# Patient Record
Sex: Female | Born: 1935 | Race: White | Hispanic: No | Marital: Married | Smoking: Never smoker
Health system: Southern US, Community
[De-identification: ages and names within clinical notes are randomized; demographics above are authoritative.]

## PROBLEM LIST (undated history)

## (undated) DIAGNOSIS — Z9889 Other specified postprocedural states: Secondary | ICD-10-CM

## (undated) DIAGNOSIS — J302 Other seasonal allergic rhinitis: Secondary | ICD-10-CM

## (undated) DIAGNOSIS — M549 Dorsalgia, unspecified: Secondary | ICD-10-CM

## (undated) DIAGNOSIS — C4491 Basal cell carcinoma of skin, unspecified: Secondary | ICD-10-CM

## (undated) DIAGNOSIS — J349 Unspecified disorder of nose and nasal sinuses: Secondary | ICD-10-CM

## (undated) DIAGNOSIS — M1990 Unspecified osteoarthritis, unspecified site: Secondary | ICD-10-CM

## (undated) DIAGNOSIS — R112 Nausea with vomiting, unspecified: Secondary | ICD-10-CM

## (undated) DIAGNOSIS — C8315 Mantle cell lymphoma, lymph nodes of inguinal region and lower limb: Secondary | ICD-10-CM

## (undated) DIAGNOSIS — K219 Gastro-esophageal reflux disease without esophagitis: Secondary | ICD-10-CM

## (undated) DIAGNOSIS — H9390 Unspecified disorder of ear, unspecified ear: Secondary | ICD-10-CM

## (undated) DIAGNOSIS — J392 Other diseases of pharynx: Secondary | ICD-10-CM

## (undated) DIAGNOSIS — G4700 Insomnia, unspecified: Secondary | ICD-10-CM

## (undated) DIAGNOSIS — J209 Acute bronchitis, unspecified: Secondary | ICD-10-CM

## (undated) DIAGNOSIS — G252 Other specified forms of tremor: Secondary | ICD-10-CM

## (undated) DIAGNOSIS — R413 Other amnesia: Secondary | ICD-10-CM

## (undated) DIAGNOSIS — F0280 Dementia in other diseases classified elsewhere without behavioral disturbance: Secondary | ICD-10-CM

## (undated) DIAGNOSIS — G250 Essential tremor: Secondary | ICD-10-CM

## (undated) DIAGNOSIS — G309 Alzheimer's disease, unspecified: Secondary | ICD-10-CM

## (undated) DIAGNOSIS — J309 Allergic rhinitis, unspecified: Secondary | ICD-10-CM

## (undated) HISTORY — DX: Acute bronchitis, unspecified: J20.9

## (undated) HISTORY — DX: Other specified forms of tremor: G25.0

## (undated) HISTORY — PX: OTHER SURGICAL HISTORY: SHX169

## (undated) HISTORY — DX: Allergic rhinitis, unspecified: J30.9

## (undated) HISTORY — DX: Insomnia, unspecified: G47.00

## (undated) HISTORY — DX: Other amnesia: R41.3

## (undated) HISTORY — DX: Alzheimer's disease, unspecified: G30.9

## (undated) HISTORY — DX: Other specified forms of tremor: G25.2

## (undated) HISTORY — PX: TOTAL ABDOMINAL HYSTERECTOMY W/ BILATERAL SALPINGOOPHORECTOMY: SHX83

## (undated) HISTORY — DX: Dementia in other diseases classified elsewhere without behavioral disturbance: F02.80

## (undated) HISTORY — DX: Gastro-esophageal reflux disease without esophagitis: K21.9

## (undated) HISTORY — PX: EXCISION, SQUAMOUS CELL: SHX4033

## (undated) HISTORY — PX: APPENDECTOMY (OPEN): SHX54

## (undated) HISTORY — DX: Basal cell carcinoma of skin, unspecified: C44.91

## (undated) HISTORY — PX: INSERTION, CATHETER, CENTRAL VENOUS, TUNNELED, WITH SUBCUTANEOUS PORT: SHX4977

## (undated) HISTORY — DX: Other diseases of pharynx: J39.2

---

## 1989-07-15 DIAGNOSIS — Z8719 Personal history of other diseases of the digestive system: Secondary | ICD-10-CM

## 1989-07-15 HISTORY — DX: Personal history of other diseases of the digestive system: Z87.19

## 1995-11-13 ENCOUNTER — Emergency Department: Admit: 1995-11-13 | Payer: Self-pay | Admitting: Internal Medicine

## 1998-03-30 ENCOUNTER — Emergency Department: Admit: 1998-03-30 | Payer: Self-pay | Admitting: Internal Medicine

## 1999-09-20 ENCOUNTER — Encounter: Admission: RE | Admit: 1999-09-20 | Discharge: 1999-09-20 | Payer: Self-pay | Admitting: Internal Medicine

## 1999-09-20 ENCOUNTER — Encounter: Payer: Self-pay | Admitting: Internal Medicine

## 1999-09-29 ENCOUNTER — Encounter: Admission: RE | Admit: 1999-09-29 | Discharge: 1999-09-29 | Payer: Self-pay | Admitting: Internal Medicine

## 1999-09-29 ENCOUNTER — Encounter: Payer: Self-pay | Admitting: Internal Medicine

## 2000-03-13 ENCOUNTER — Encounter: Payer: Self-pay | Admitting: Internal Medicine

## 2000-03-13 ENCOUNTER — Encounter: Admission: RE | Admit: 2000-03-13 | Discharge: 2000-03-13 | Payer: Self-pay | Admitting: Internal Medicine

## 2001-04-03 ENCOUNTER — Encounter: Payer: Self-pay | Admitting: Internal Medicine

## 2001-04-03 ENCOUNTER — Encounter: Admission: RE | Admit: 2001-04-03 | Discharge: 2001-04-03 | Payer: Self-pay | Admitting: Internal Medicine

## 2001-06-20 ENCOUNTER — Emergency Department: Admit: 2001-06-20 | Payer: Self-pay | Source: Emergency Department | Admitting: Emergency Medicine

## 2001-10-26 ENCOUNTER — Encounter: Admission: RE | Admit: 2001-10-26 | Discharge: 2001-10-26 | Payer: Self-pay | Admitting: Internal Medicine

## 2001-10-26 ENCOUNTER — Encounter: Payer: Self-pay | Admitting: Internal Medicine

## 2002-06-06 ENCOUNTER — Encounter: Payer: Self-pay | Admitting: Internal Medicine

## 2002-06-06 ENCOUNTER — Encounter: Admission: RE | Admit: 2002-06-06 | Discharge: 2002-06-06 | Payer: Self-pay | Admitting: Internal Medicine

## 2003-05-30 ENCOUNTER — Encounter: Payer: Self-pay | Admitting: Internal Medicine

## 2003-05-30 ENCOUNTER — Encounter: Admission: RE | Admit: 2003-05-30 | Discharge: 2003-05-30 | Payer: Self-pay | Admitting: Internal Medicine

## 2003-11-03 ENCOUNTER — Inpatient Hospital Stay
Admission: EM | Admit: 2003-11-03 | Disposition: A | Discharge status: Home / Self Care | Payer: Self-pay | Source: Emergency Department | Admitting: Internal Medicine

## 2004-01-01 ENCOUNTER — Ambulatory Visit
Admit: 2004-01-01 | Disposition: A | Discharge status: Home / Self Care | Payer: Self-pay | Source: Ambulatory Visit | Admitting: Family Medicine

## 2004-01-15 ENCOUNTER — Ambulatory Visit
Admit: 2004-01-15 | Disposition: A | Discharge status: Home / Self Care | Payer: Self-pay | Source: Ambulatory Visit | Admitting: Family Medicine

## 2004-06-01 ENCOUNTER — Encounter: Admission: RE | Admit: 2004-06-01 | Discharge: 2004-06-01 | Payer: Self-pay | Admitting: Internal Medicine

## 2005-01-14 ENCOUNTER — Ambulatory Visit: Payer: Self-pay | Admitting: Internal Medicine

## 2005-03-14 ENCOUNTER — Encounter (INDEPENDENT_AMBULATORY_CARE_PROVIDER_SITE_OTHER): Payer: Self-pay | Admitting: *Deleted

## 2005-03-14 ENCOUNTER — Ambulatory Visit (HOSPITAL_COMMUNITY): Admission: RE | Admit: 2005-03-14 | Discharge: 2005-03-14 | Payer: Self-pay | Admitting: Gastroenterology

## 2005-06-14 ENCOUNTER — Encounter: Admission: RE | Admit: 2005-06-14 | Discharge: 2005-06-14 | Payer: Self-pay | Admitting: Internal Medicine

## 2005-07-14 ENCOUNTER — Ambulatory Visit: Payer: Self-pay | Admitting: Internal Medicine

## 2006-05-03 ENCOUNTER — Ambulatory Visit: Payer: Self-pay | Admitting: Internal Medicine

## 2006-07-10 ENCOUNTER — Encounter: Admission: RE | Admit: 2006-07-10 | Discharge: 2006-07-10 | Payer: Self-pay | Admitting: Internal Medicine

## 2007-05-01 ENCOUNTER — Ambulatory Visit: Payer: Self-pay | Admitting: Internal Medicine

## 2007-08-27 ENCOUNTER — Encounter: Admission: RE | Admit: 2007-08-27 | Discharge: 2007-08-27 | Payer: Self-pay | Admitting: Internal Medicine

## 2008-04-28 DIAGNOSIS — J209 Acute bronchitis, unspecified: Secondary | ICD-10-CM

## 2008-04-28 DIAGNOSIS — G252 Other specified forms of tremor: Secondary | ICD-10-CM

## 2008-04-28 DIAGNOSIS — G250 Essential tremor: Secondary | ICD-10-CM

## 2008-04-28 DIAGNOSIS — J309 Allergic rhinitis, unspecified: Secondary | ICD-10-CM | POA: Insufficient documentation

## 2008-04-28 DIAGNOSIS — K219 Gastro-esophageal reflux disease without esophagitis: Secondary | ICD-10-CM

## 2008-04-28 DIAGNOSIS — G4700 Insomnia, unspecified: Secondary | ICD-10-CM

## 2008-04-29 ENCOUNTER — Ambulatory Visit: Payer: Self-pay | Admitting: Internal Medicine

## 2008-08-28 ENCOUNTER — Encounter: Admission: RE | Admit: 2008-08-28 | Discharge: 2008-08-28 | Payer: Self-pay | Admitting: Internal Medicine

## 2009-03-17 ENCOUNTER — Ambulatory Visit
Admit: 2009-03-17 | Disposition: A | Discharge status: Home / Self Care | Payer: Self-pay | Source: Ambulatory Visit | Admitting: Family Medicine

## 2009-04-28 ENCOUNTER — Ambulatory Visit: Payer: Self-pay | Admitting: Internal Medicine

## 2009-09-02 ENCOUNTER — Encounter: Admission: RE | Admit: 2009-09-02 | Discharge: 2009-09-02 | Payer: Self-pay | Admitting: Internal Medicine

## 2009-09-15 ENCOUNTER — Encounter: Admission: RE | Admit: 2009-09-15 | Discharge: 2009-09-15 | Payer: Self-pay | Admitting: Internal Medicine

## 2010-04-29 ENCOUNTER — Telehealth: Payer: Self-pay | Admitting: Internal Medicine

## 2010-08-31 ENCOUNTER — Encounter: Admission: RE | Admit: 2010-08-31 | Discharge: 2010-08-31 | Payer: Self-pay | Admitting: Internal Medicine

## 2010-09-23 ENCOUNTER — Encounter: Admission: RE | Admit: 2010-09-23 | Discharge: 2010-09-23 | Payer: Self-pay | Admitting: Internal Medicine

## 2010-12-05 ENCOUNTER — Encounter: Payer: Self-pay | Admitting: Internal Medicine

## 2010-12-06 ENCOUNTER — Encounter: Payer: Self-pay | Admitting: Internal Medicine

## 2010-12-14 NOTE — Progress Notes (Signed)
Summary: nos appt  Phone Note Call from Patient   Caller: [email or web address]  Call For: Nicole [name] Summary of Call: Pt states she will call at a later date to rsc nos from 6/15. Initial call taken by: Darletta Moll,  April 29, 2010 3:51 PM

## 2011-03-29 NOTE — Assessment & Plan Note (Signed)
[facility]                             PULMONARY OFFICE NOTE   NAME:JOHNSONRaylene Miyamoto                    MRN:          [id number]  DATE:05/01/2007                            DOB:          [birth year]    PULMONARY OFFICE FOLLOWUP   PROBLEMS:  1. Asthmatic bronchitis.  2. Allergic rhinitis.  3. Insomnia.  4. Esophageal reflux.  5. Essential tremor.   HISTORY:  She is doing quite well.  She has rarely needed albuterol or  Advair, not in months.  She has not used Ambien in a long time.  She  asks I refill her daily Prevacid, which I agreed to do after explaining  that they would probably be pushed back from her insurance, and in the  long run, this ought to be handled by Dr. Newell Coral attention.  We  discussed possible aggravation of her familial essential tremor by  bronchodilators, but she has not been using them.   MEDICATIONS:  1. Prevacid 30 mg.  2. Aspirin 325 mg.  3. Calcium with vitamin D.  4. P.r.n. use of albuterol.  5. Allegra 180 mg.  6. Ambien CR 6.25 mg.  7. Nasonex.   No medication allergy.   OBJECTIVE:  Weight 188 pounds, BP 110/72, pulse 84, room air saturation  96%.  Conjunctivae clear.  Nasal mucosa looks normal.  CHEST:  Very clear.  HEART:  Sounds are regular without murmur.  She has the distinct head-  bob tremor now.   IMPRESSION:  Her respiratory complaints are doing very well off of  therapy.  We discussed how to resume with her meds should there be a  seasonal change.  Her reflux remains active if she does not take a  Prevacid daily, and this worked better for her than Nexium.   PLAN:  1. Prevacid 30 mg refilled.  2. Allegra 180 mg refilled.  Both meds with discussion of insurance      limitations and available over-the-counter products.  3. Refilled Nasonex.  4. Schedule return in 1 year, but earlier p.r.n.  Primary followup is      through Dr. Earl Gala.     Clinton D. Maple Hudson, MD, Tonny Bollman, FACP  Electronically Signed    CDY/MedQ  DD: 05/01/2007  DT: 05/02/2007  Job #: [id number]   cc:   Theressa Millard, M.D.

## 2011-04-01 NOTE — Op Note (Signed)
NAME:  CATILYN, BOGGUS NO.:  [id number]   MEDICAL RECORD NO.:  [id number]          PATIENT TYPE:  AMB   LOCATION:  ENDO                         FACILITY:  [facility]   PHYSICIAN:  Danise Edge, M.D.   DATE OF BIRTH:  1933-12-14   DATE OF PROCEDURE:  03/14/2005  DATE OF DISCHARGE:                                 OPERATIVE REPORT   PROCEDURE:  Colonoscopy and polypectomy.   INDICATIONS:  Ms. Bryelle Spiewak is a 77 year old female born 10/29/1934. Ms. Buckles required a segmental colon resection to remove a  neoplastic polyp years ago. She is scheduled to undergo a surveillance  colonoscopy with polypectomy to prevent colon cancer.   ENDOSCOPIST:  Danise Edge, M.D.   PREMEDICATION:  Versed 8.5 mg, Demerol 70 mg.   DESCRIPTION OF PROCEDURE:  After obtaining informed consent, Ms. Mckenzie was  placed in the left lateral decubitus position. I administered intravenous  Demerol and intravenous Versed to achieve conscious sedation for the  procedure. The patient's blood pressure, oxygen saturation and cardiac  rhythm were monitored throughout the procedure and documented in the medical  record.   Anal inspection and digital rectal exam were normal. The Olympus adjustable  pediatric colonoscope was introduced into the rectum and with some degree of  difficulty advanced to the cecum. I had to reposition Ms. Glomski from the  left lateral decubitus position to the supine position in order to negotiate  the left colon due to colonic loop formation. Once around the splenic  flexure, the advancement of the endoscope to the cecum was easy. Colonic  preparation for the exam today was satisfactory.   RECTUM:  Normal. Retroflexed view of the distal rectum normal.  SIGMOID COLON AND DESCENDING COLON:  Extensive left colonic diverticulosis  SPLENIC FLEXURE:  Normal.  TRANSVERSE COLON:  Normal.  HEPATIC FLEXURE:  Normal.  ASCENDING COLON:  From the distal ascending  colon, a 1 mm sessile polyp was  removed with the cold biopsy forceps.  CECUM AND ILEOCECAL VALVE:  Normal.   ASSESSMENT:  1.  Extensive left colonic diverticulosis.  2.  A diminutive polyp was removed from the distal ascending colon with the      cold biopsy forceps.   RECOMMENDATIONS:  Repeat colonoscopy in five years.      MJ/MEDQ  D:  03/14/2005  T:  03/14/2005  Job:  [id number]   cc:   Theressa Millard, M.D.  [address] [place]  [place] [address]  Fax: [phone]

## 2011-11-16 ENCOUNTER — Encounter (INDEPENDENT_AMBULATORY_CARE_PROVIDER_SITE_OTHER): Payer: Self-pay | Admitting: Neurology

## 2011-11-16 ENCOUNTER — Ambulatory Visit (INDEPENDENT_AMBULATORY_CARE_PROVIDER_SITE_OTHER): Payer: Medicare Other | Admitting: Neurology

## 2011-11-16 VITALS — BP 136/73 | HR 88 | Ht 64.0 in | Wt 126.6 lb

## 2011-11-16 DIAGNOSIS — M545 Low back pain, unspecified: Secondary | ICD-10-CM | POA: Insufficient documentation

## 2011-11-16 DIAGNOSIS — IMO0002 Reserved for concepts with insufficient information to code with codable children: Secondary | ICD-10-CM

## 2011-11-16 DIAGNOSIS — M5416 Radiculopathy, lumbar region: Secondary | ICD-10-CM

## 2011-11-16 MED ORDER — GABAPENTIN 300 MG PO CAPS
ORAL_CAPSULE | ORAL | Status: DC
Start: 2011-11-16 — End: 2012-06-01

## 2011-11-16 MED ORDER — CYCLOBENZAPRINE HCL 10 MG PO TABS
ORAL_TABLET | ORAL | Status: AC
Start: 2011-11-16 — End: 2011-11-26

## 2011-11-16 NOTE — Progress Notes (Signed)
HISTORY OF PRESENT ILLNESS:  Maureen Vasquez is a 78 year old woman with some back discomfort.  She was  doing well until several years ago when she started having back discomfort.   Since May, this has increased significantly.  She describes pain in the  lower lumbar spine radiating down the right lateral hip and thigh, at times  posteriorly.  This does not reach the ankle.  She remembers having shoveled  snow in 2010 and this may have exacerbated her discomfort.  She has been  seen by [facility] and has undergone physical therapy  at [facility].  She was, however, interested in [facility]  in [place] performing surgery, but decided to have a neurological  evaluation prior to this.  She has also tried Medrol Dosepak  intermittently.  She has never had epidural steroid injections.  She denies  any bowel or bladder complaints.  She is able to ambulate more than several  blocks.  She notes that at times in the mall she will have to use a because  she has right-sided sciatic discomfort.  She denies any incontinence or any  leg weakness or heaviness or numbness that is persistent.  She occasionally  will note shooting pains when she coughs or sneezes.  The pain is at least  a 4 to 5/10 in intensity.     ALLERGIES:  ASPIRIN and CODEINE.     MEDICATIONS:  B12, Tylenol, magnesium, Sudafed.     PAST MEDICAL HISTORY:  Pancreatitis.     PAST SURGICAL HISTORY:  Appendicectomy.     RECORD REVIEW:  I reviewed her patient filled out form.  We will request the reports from  her orthopedic office and she will bring them at her next visit.  I  reviewed her MRI lumbar spine films from August 08, 2011 with the  patient.  This shows L3-L4, L4-L5, and L5-S1 disk bulge without significant  cord compromise or significant canal stenosis.  Minimal canal stenosis is  noted at L4-L5.  Right L5 neural foraminal moderate narrowing is noted.     SOCIAL HISTORY:  She is married.  She does not smoke.  She  drinks wine.     FAMILY HISTORY:  Negative for any similar neurologic problems.     REVIEW OF SYSTEMS:  Positive for the above complaints.  She denied any fever, night sweats,  chills, rash, or any recent back injury, all other 10-point review of  systems is negative.     PHYSICAL EXAMINATION:  Straight leg raising test was mildly positive on the right.  She had no  numbness to pinprick.  Her reflexes were intact.  Her sensation is intact.        Blood pressure 136/73, pulse 88, height 1.626 m (5\' 4" ), weight 57.425 kg (126 lb 9.6 oz).    General: On examination today, blood pressure was stable, vitals were stable, pulse was regular, temperature is afebrile, and respiratory rate regular. The patient was in no apparent distress. There was no pallor, icterus, or cyanosis. There was no pedal edema. The lungs were clear to auscultation. Heart sounds were S1 and S2 normal. There was no carotid bruit. Peripheral pulses were intact. Extremities were normal in color.     NEURO EXAM:  Alert oriented x3. The memory, speech, attention, awareness, and concentration were intact.   CRANIAL NERVES:Pupils were equal, round, reactive to light and accommodation. Visual fields were full. Extraocular movements were intact without nystagmus. Face was symmetric. Facial sensations  were intact. The tongue was midline. The palate elevated equally. The rest of the cranial exam from II-XII was normal.   MOTOR : Strength was 5/5 in the upper and lower extremities with no pronator drift.   COORDINATION : was intact with finger-to nose and heel-to-shin testing being normal. GAIT: was normal. Romberg's sign was negative.   SENSORY exam was intact for pinprick, light touch, and temperature as well as vibration.  TONE was normal. There was no atrophy or fasciculations.   REFLEXES were 2 in the upper and lower extremities with downgoing plantars. Fundoscopy revealed sharp discs bilaterally.     IMPRESSION AND PLAN:  A 78 year old woman with right  L5-S1 mild lumbar radiculopathy with minimal  lumbar stenosis at L4-L5.  I recommended further evaluation with EMG nerve  conduction studies as well as a trial of physical therapy, and Neurontin  300 mg nightly, and after a week 300 mg b.i.d., in addition to Flexeril 10  mg nightly for a week and then p.r.n.  She will follow with [place] in 6 weeks.   We will request records from her orthopedic's office and she will bring  them at her next visit.  If her back pain and discomfort persists then a  trial of epidural steroid injections may be warranted.  If these  conservative measures prove inefficacious then referral to neurosurgery may  be warranted for right L5 lumbar laminectomy or diskectomy procedures.   This plan was discussed with the patient and her husband.  We discussed  good back measures.

## 2011-11-17 ENCOUNTER — Encounter (INDEPENDENT_AMBULATORY_CARE_PROVIDER_SITE_OTHER): Payer: Self-pay | Admitting: Neurology

## 2011-12-07 ENCOUNTER — Encounter (INDEPENDENT_AMBULATORY_CARE_PROVIDER_SITE_OTHER): Payer: Medicare Other

## 2012-01-04 ENCOUNTER — Other Ambulatory Visit (INDEPENDENT_AMBULATORY_CARE_PROVIDER_SITE_OTHER): Payer: Medicare Other | Admitting: Neurology

## 2012-02-21 ENCOUNTER — Ambulatory Visit (INDEPENDENT_AMBULATORY_CARE_PROVIDER_SITE_OTHER): Payer: Medicare Other | Admitting: Internal Medicine

## 2012-02-21 ENCOUNTER — Encounter: Payer: Self-pay | Admitting: *Deleted

## 2012-02-21 VITALS — BP 122/74 | HR 79 | Ht 65.5 in | Wt 159.2 lb

## 2012-02-21 DIAGNOSIS — J309 Allergic rhinitis, unspecified: Secondary | ICD-10-CM

## 2012-02-21 DIAGNOSIS — J209 Acute bronchitis, unspecified: Secondary | ICD-10-CM

## 2012-02-21 MED ORDER — METHYLPREDNISOLONE ACETATE 80 MG/ML IJ SUSP
80.0000 mg | Freq: Once | INTRAMUSCULAR | Status: AC
  Administered 2012-02-21: 80 mg via INTRAMUSCULAR

## 2012-02-21 MED ORDER — PROMETHAZINE-CODEINE 6.25-10 MG/5ML PO SYRP: 5.0000 mL | ORAL_SOLUTION | Freq: Four times a day (QID) | ORAL | Status: AC | PRN

## 2012-02-21 MED ORDER — LEVALBUTEROL HCL 0.63 MG/3ML IN NEBU
0.6300 mg | INHALATION_SOLUTION | Freq: Once | RESPIRATORY_TRACT | Status: AC
  Administered 2012-02-21: 0.63 mg via RESPIRATORY_TRACT

## 2012-02-21 NOTE — Progress Notes (Signed)
02/21/12- 78 yoF never smoker followed for asthma, rhinitis, complicated by GERD, essential tremor  PCP Dr Earl Gala LOV- 04/28/09   Here with daughter Steward Drone  Having wheezing and SOB, deep cough x 4-5 weeks; UC 2 times-was given abx and not clearing up. Had stopped allergy vaccine, but remembers allergy shots as helpful. With an upper respiratory infection and ear ache she went to ENT who told her there was very little hearing loss. She notices tinnitus and sharp pains in her ears. Now for 2 weeks has had head and chest congestion, mostly clear mucus with no fever. At last visit to urgent care she was treated with Biaxin and several days of prednisone. These are completed. She was told chest x-ray was negative for pneumonia. Has noted some wheeze. Pro air was no help. Never tried Nasonex with this because she was afraid to steroids would aggravate an infection.  ROS-see HPI Constitutional:   No-   weight loss, night sweats, fevers, chills, fatigue, lassitude. HEENT:   No-  headaches, difficulty swallowing, tooth/dental problems, sore throat,       No-  sneezing, itching,  +ear ache, nasal congestion, post nasal drip,  CV:  No-   chest pain, orthopnea, PND, swelling in lower extremities, anasarca, dizziness, palpitations Resp: No-   shortness of breath with exertion or at rest.              +   productive cough,  No non-productive cough,  No- coughing up of blood.              No-   change in color of mucus.  + wheezing.   Skin: No-   rash or lesions. GI:  No-   heartburn, indigestion, abdominal pain, nausea, vomiting,  GU: No-   dysuria, MS:  No-   joint pain or swelling.   Neuro-     nothing unusual Psych:  No- change in mood or affect. No depression or anxiety.  No memory loss.  OBJ- Physical Exam General- Alert, Oriented, Affect-appropriate, Distress- none acute Skin- rash-none, lesions- none, excoriation- none Lymphadenopathy- none Head- atraumatic            Eyes- Gross vision intact,  PERRLA, conjunctivae and secretions clear            Ears- Hearing, canals-normal            Nose- Clear, no-Septal dev, mucus, polyps, erosion, perforation             Throat- Mallampati II , mucosa clear , drainage- none, tonsils- atrophic Neck- flexible , trachea midline, no stridor , thyroid nl, carotid no bruit Chest - symmetrical excursion , unlabored           Heart/CV- RRR , no murmur , no gallop  , no rub, nl s1 s2                           - JVD- none , edema- none, stasis changes- none, varices- none           Lung- clear to P&A, wheeze- none, + harsh cough , dullness-none, rub- none           Chest wall-  Abd- tender-no, distended-no, bowel sounds-present, HSM- no Br/ Gen/ Rectal- Not done, not indicated Extrem- cyanosis- none, clubbing, none, atrophy- none, strength- nl Neuro- tremor

## 2012-02-21 NOTE — Patient Instructions (Signed)
Neb xop 0.63  Depo 80  Sample Advair 250      1 puff, then rinse mouth well, twice every day till sample used up.  Script for cough syrup

## 2012-02-26 NOTE — Assessment & Plan Note (Signed)
Tracheobronchitis which began as an upper respiratory infection with bronchitis, probably a viral syndrome initially. Plan-nebulizer treatment with Xopenex, Depo-Medrol, sample Advair 250. Medications discussed.

## 2012-06-01 ENCOUNTER — Emergency Department: Payer: Medicare Other

## 2012-06-01 ENCOUNTER — Emergency Department
Admission: EM | Admit: 2012-06-01 | Discharge: 2012-06-01 | Disposition: A | Discharge status: Home / Self Care | Payer: Medicare Other | Attending: Emergency Medical Services | Admitting: Emergency Medical Services

## 2012-06-01 DIAGNOSIS — N390 Urinary tract infection, site not specified: Secondary | ICD-10-CM

## 2012-06-01 DIAGNOSIS — R1011 Right upper quadrant pain: Secondary | ICD-10-CM | POA: Insufficient documentation

## 2012-06-01 HISTORY — DX: Dorsalgia, unspecified: M54.9

## 2012-06-01 LAB — CBC AND DIFFERENTIAL
Basophils Absolute Automated: 0.03 10*3/uL (ref 0.00–0.20)
Basophils Automated: 0 % (ref 0–2)
Eosinophils Absolute Automated: 0.08 10*3/uL (ref 0.00–0.70)
Eosinophils Automated: 1 % (ref 0–5)
Hematocrit: 38.9 % (ref 37.0–47.0)
Hgb: 13.8 g/dL (ref 12.0–16.0)
Immature Granulocytes Absolute: 0.01 10*3/uL
Immature Granulocytes: 0 % (ref 0–1)
Lymphocytes Absolute Automated: 1.56 10*3/uL (ref 0.50–4.40)
Lymphocytes Automated: 23 % (ref 15–41)
MCH: 30.6 pg (ref 28.0–32.0)
MCHC: 35.5 g/dL (ref 32.0–36.0)
MCV: 86.3 fL (ref 80.0–100.0)
MPV: 10.3 fL (ref 9.4–12.3)
Monocytes Absolute Automated: 0.92 10*3/uL (ref 0.00–1.20)
Monocytes: 13 % — ABNORMAL HIGH (ref 0–11)
Neutrophils Absolute: 4.27 10*3/uL (ref 1.80–8.10)
Neutrophils: 62 % (ref 52–75)
Nucleated RBC: 0 /100 WBC (ref 0–1)
Platelets: 290 10*3/uL (ref 140–400)
RBC: 4.51 10*6/uL (ref 4.20–5.40)
RDW: 13 % (ref 12–15)
WBC: 6.86 10*3/uL (ref 3.50–10.80)

## 2012-06-01 LAB — COMPREHENSIVE METABOLIC PANEL
ALT: 8 U/L (ref 0–55)
AST (SGOT): 20 U/L (ref 5–34)
Albumin/Globulin Ratio: 1.6 (ref 0.9–2.2)
Albumin: 4.2 g/dL (ref 3.5–5.0)
Alkaline Phosphatase: 75 U/L (ref 40–150)
Anion Gap: 9 (ref 5.0–15.0)
BUN: 11 mg/dL (ref 7–19)
Bilirubin, Total: 0.6 mg/dL (ref 0.2–1.2)
CO2: 22 mEq/L (ref 22–29)
Calcium: 9.5 mg/dL (ref 7.9–10.6)
Chloride: 98 mEq/L (ref 98–107)
Creatinine: 0.7 mg/dL (ref 0.6–1.0)
Globulin: 2.6 g/dL (ref 2.0–3.6)
Glucose: 86 mg/dL (ref 70–100)
Potassium: 4.6 mEq/L (ref 3.5–5.1)
Protein, Total: 6.8 g/dL (ref 6.0–8.3)
Sodium: 129 mEq/L — ABNORMAL LOW (ref 136–145)

## 2012-06-01 LAB — URINALYSIS, REFLEX TO MICROSCOPIC EXAM IF INDICATED
Bilirubin, UA: NEGATIVE
Blood, UA: NEGATIVE
Glucose, UA: NEGATIVE
Nitrite, UA: POSITIVE — AB
Protein, UR: NEGATIVE
Specific Gravity UA: 1.006 (ref 1.001–1.035)
Urine pH: 6.5 (ref 5.0–8.0)
Urobilinogen, UA: NORMAL mg/dL

## 2012-06-01 LAB — GFR: EGFR: >60

## 2012-06-01 LAB — CK: Creatine Kinase (CK): 63 U/L (ref 29–168)

## 2012-06-01 LAB — TROPONIN I: Troponin I: <0.01 ng/mL (ref 0.00–0.09)

## 2012-06-01 MED ORDER — IOHEXOL 350 MG/ML IV SOLN
80.00 mL | Freq: Once | INTRAVENOUS | Status: AC | PRN
Start: 2012-06-01 — End: 2012-06-01
  Administered 2012-06-01: 80 mL via INTRAVENOUS

## 2012-06-01 MED ORDER — NITROFURANTOIN MONOHYD MACRO 100 MG PO CAPS
100.00 mg | ORAL_CAPSULE | Freq: Once | ORAL | Status: AC
Start: 2012-06-01 — End: 2012-06-01
  Administered 2012-06-01: 100 mg via ORAL
  Filled 2012-06-01: qty 1

## 2012-06-01 MED ORDER — NITROFURANTOIN MONOHYD MACRO 100 MG PO CAPS
100.00 mg | ORAL_CAPSULE | Freq: Two times a day (BID) | ORAL | Status: AC
Start: 2012-06-01 — End: 2012-06-08

## 2012-06-01 MED ORDER — IOHEXOL 300 MG/ML IJ SOLN
30.00 mL | Freq: Once | INTRAMUSCULAR | Status: AC
Start: 2012-06-01 — End: 2012-06-01
  Administered 2012-06-01: 30 mL via ORAL

## 2012-06-01 NOTE — ED Notes (Signed)
1 week of upper abdominal and mid back pain. Pain increases at night and after eating. No n/v/d. No fevers. No urinary complaints. No constipation.

## 2012-06-01 NOTE — ED Provider Notes (Signed)
Physician/Midlevel provider first contact with patient: 06/01/12 1817     78 yof, c/o intermittent RUQ and epigastric pain x 1 week especially after eating meals, with pain radiation to mid back.  Denies n/v/d, dysuria, chest pain, diaphoresis, lightheadedness, recent abdominal surgeries.     History     Chief Complaint   Patient presents with   . Abdominal Pain     Patient is a 78 y.o. female presenting with abdominal pain. The history is provided by the patient. No language interpreter was used.   Abdominal Pain  The primary symptoms of the illness include abdominal pain and shortness of breath. The primary symptoms of the illness do not include fever, fatigue, nausea, vomiting, diarrhea, hematemesis, hematochezia, dysuria, vaginal discharge or vaginal bleeding. The current episode started more than 2 days ago. The onset of the illness was gradual. The problem has not changed since onset.  The patient states that she believes she is currently not pregnant. The patient has not had a change in bowel habit. Additional symptoms associated with the illness include back pain. Symptoms associated with the illness do not include chills, anorexia, diaphoresis, heartburn, constipation, urgency, hematuria or frequency.       Past Medical History   Diagnosis Date   . Pancreatitis    . Back pain        Past Surgical History   Procedure Date   . Appendectomy        No family history on file.    Social  History   Substance Use Topics   . Smoking status: Never Smoker    . Smokeless tobacco: Not on file   . Alcohol Use: 4.2 oz/week     7 Glasses of wine per week       .     Allergies   Allergen Reactions   . Aspirin Other (See Comments)     Oral and stomach irritation if taken daily   . Codeine Nausea Only       Current/Home Medications    CHOLECALCIFEROL (VITAMIN D-3 PO)    Take by mouth.      CYANOCOBALAMIN (VITAMIN B-12 PO)    Take by mouth.      MAGNESIUM PO    Take by mouth.      PSEUDOEPHEDRINE HCL (SUDAFED PO)    Take by  mouth as needed.          Review of Systems   Constitutional: Negative for fever, chills, diaphoresis, activity change and fatigue.   HENT: Negative for congestion, sore throat, rhinorrhea, trouble swallowing, neck pain and neck stiffness.    Eyes: Negative for visual disturbance.   Respiratory: Positive for shortness of breath. Negative for cough.    Cardiovascular: Negative for chest pain.   Gastrointestinal: Positive for abdominal pain. Negative for heartburn, nausea, vomiting, diarrhea, constipation, hematochezia, anorexia and hematemesis.   Genitourinary: Negative for dysuria, urgency, frequency, hematuria, vaginal bleeding, vaginal discharge and difficulty urinating.   Musculoskeletal: Positive for back pain.   Skin: Negative for color change and rash.   Neurological: Negative for dizziness, weakness and headaches.   Psychiatric/Behavioral: Negative for behavioral problems and confusion.       Physical Exam    BP 160/74  Pulse 74  Temp 98.4 F (36.9 C)  Resp 18  Ht 1.651 m  Wt 56.7 kg  BMI 20.80 kg/m2  SpO2 96%    Physical Exam   Constitutional: She is oriented to person, place, and time. She appears well-developed  and well-nourished. No distress.   HENT:   Head: Normocephalic and atraumatic.   Nose: Nose normal.   Mouth/Throat: Oropharynx is clear and moist.   Eyes: Conjunctivae normal are normal. Pupils are equal, round, and reactive to light. No scleral icterus.   Neck: Normal range of motion. Neck supple.   Cardiovascular: Normal rate, regular rhythm, normal heart sounds and intact distal pulses.    Pulmonary/Chest: Effort normal and breath sounds normal. No respiratory distress. She has no wheezes. She has no rales.   Abdominal: Soft. Bowel sounds are normal. She exhibits no distension. There is tenderness in the right upper quadrant and epigastric area. There is positive Murphy's sign. There is no rigidity, no rebound, no guarding and no CVA tenderness.   Musculoskeletal: Normal range of motion.  She exhibits no edema and no tenderness.   Neurological: She is alert and oriented to person, place, and time.   Skin: Skin is warm and dry. No rash noted. She is not diaphoretic. No pallor.   Psychiatric: She has a normal mood and affect. Her behavior is normal.       MDM and ED Course     ED Medication Orders      Start     Status Ordering Provider    06/01/12 2213   nitrofurantoin (macrocrystal-monohydrate) (MACROBID) capsule 100 mg   Once      Route: Oral  Ordered Dose: 100 mg         Ordered Quinetta Shilling    06/01/12 1915   iohexol (OMNIPAQUE) 300 MG/ML injection 30 mL   Once      Route: Oral  Ordered Dose: 30 mL         Last MAR action:  Given Avondre Richens                 MDM      Procedures    Clinical Impression & Disposition     Clinical Impression  Final diagnoses:   UTI (lower urinary tract infection)   Abdominal pain, chronic, right upper quadrant        ED Disposition     Discharge [name] discharge to home/self care.    Condition at discharge: Good             New Prescriptions    NITROFURANTOIN, MACROCRYSTAL-MONOHYDRATE, (MACROBID) 100 MG CAPSULE    Take 1 capsule (100 mg total) by mouth 2 (two) times daily.      Clinical Course / MDM     Working Differential (not completely inclusive):       Notes: The patient presents with abdominal pain without signs of peritonitis or other life-threatening or serious etiology. The patient appears stable for discharge and has been instructed to return immediately if the symptoms worsen or change in any way, or in 8-12 hours if not improved for re-evaluation. Abdominal pain warnings were given and I emphasized the need for close follow-up with their primary care physician.      Discussion of abnormal results/incidental findings:         Data Review     Nursing records reviewed and agree: Yes    Laboratory results reviewed by ED provider: Yes  Radiologic study results reviewed by ED provider: Yes  Radiologic Studies Interpreted (viewed) by ED provider: No       Rendering Provider: Georga Hacking      Signout     Patient signed out to:      Signout notes:  Georga Hacking, PA  06/01/12 1848    Georga Hacking, PA  06/01/12 2215

## 2012-06-02 LAB — ECG 12-LEAD
Atrial Rate: 77 {beats}/min
P Axis: 61 degrees
P-R Interval: 192 ms
Q-T Interval: 356 ms
QRS Duration: 70 ms
QTC Calculation (Bezet): 402 ms
R Axis: 30 degrees
T Axis: 70 degrees
Ventricular Rate: 77 {beats}/min

## 2012-07-17 ENCOUNTER — Other Ambulatory Visit: Payer: Self-pay | Admitting: Internal Medicine

## 2012-07-17 DIAGNOSIS — Z1231 Encounter for screening mammogram for malignant neoplasm of breast: Secondary | ICD-10-CM

## 2012-08-13 ENCOUNTER — Ambulatory Visit
Admission: RE | Admit: 2012-08-13 | Discharge: 2012-08-13 | Disposition: A | Discharge status: Home / Self Care | Payer: Medicare Other | Source: Ambulatory Visit | Attending: Internal Medicine | Admitting: Internal Medicine

## 2012-08-13 DIAGNOSIS — Z1231 Encounter for screening mammogram for malignant neoplasm of breast: Secondary | ICD-10-CM

## 2012-08-14 DIAGNOSIS — C8590 Non-Hodgkin lymphoma, unspecified, unspecified site: Secondary | ICD-10-CM

## 2012-08-14 HISTORY — DX: Non-Hodgkin lymphoma, unspecified, unspecified site: C85.90

## 2012-10-25 ENCOUNTER — Encounter
Admission: RE | Disposition: A | Discharge status: Home / Self Care | Payer: Self-pay | Source: Ambulatory Visit | Attending: Body Imaging

## 2012-10-25 ENCOUNTER — Ambulatory Visit
Admission: RE | Admit: 2012-10-25 | Discharge: 2012-10-25 | Disposition: A | Discharge status: Home / Self Care | Payer: Medicare Other | Source: Ambulatory Visit | Attending: Body Imaging | Admitting: Body Imaging

## 2012-10-25 ENCOUNTER — Ambulatory Visit: Payer: Medicare Other | Admitting: Body Imaging

## 2012-10-25 DIAGNOSIS — C8590 Non-Hodgkin lymphoma, unspecified, unspecified site: Secondary | ICD-10-CM

## 2012-10-25 SURGERY — MEDIPORT PLACEMENT

## 2012-10-25 MED ORDER — LIDOCAINE-EPINEPHRINE 1 %-1:100000 IJ SOLN
INTRAMUSCULAR | Status: AC
Start: 2012-10-25 — End: ?
  Filled 2012-10-25: qty 1

## 2012-10-25 MED ORDER — LIDOCAINE 1% BUFFERED - CNR/OUTSOURCED
INTRAMUSCULAR | Status: AC
Start: 2012-10-25 — End: ?
  Filled 2012-10-25: qty 22

## 2012-10-25 MED ORDER — CEFAZOLIN SODIUM 1 G IJ SOLR
INTRAMUSCULAR | Status: AC
Start: 2012-10-25 — End: ?
  Filled 2012-10-25: qty 1000

## 2012-10-25 MED ORDER — MIDAZOLAM HCL 2 MG/2ML IJ SOLN
INTRAMUSCULAR | Status: AC
Start: 2012-10-25 — End: ?
  Filled 2012-10-25: qty 4

## 2012-10-25 MED ORDER — SODIUM CHLORIDE 0.9 % IV MBP
1.00 g | Freq: Once | INTRAVENOUS | Status: AC
Start: 2012-10-25 — End: 2012-10-25
  Administered 2012-10-25: 1 g via INTRAVENOUS

## 2012-10-25 MED ORDER — FENTANYL CITRATE 0.05 MG/ML IJ SOLN
INTRAMUSCULAR | Status: AC | PRN
Start: 2012-10-25 — End: 2012-10-25
  Administered 2012-10-25: 25 ug via INTRAVENOUS

## 2012-10-25 MED ORDER — MIDAZOLAM HCL 2 MG/2ML IJ SOLN
INTRAMUSCULAR | Status: AC | PRN
Start: 2012-10-25 — End: 2012-10-25
  Administered 2012-10-25: .5 mg via INTRAVENOUS

## 2012-10-25 MED ORDER — FENTANYL CITRATE 0.05 MG/ML IJ SOLN
INTRAMUSCULAR | Status: AC
Start: 2012-10-25 — End: ?
  Filled 2012-10-25: qty 4

## 2012-10-25 MED ORDER — HEPARIN SOD (PORK) LOCK FLUSH 100 UNIT/ML IV SOLN
INTRAVENOUS | Status: AC
Start: 2012-10-25 — End: ?
  Filled 2012-10-25: qty 5

## 2012-10-25 NOTE — Sedation Documentation (Signed)
Patient into procedure room 2, identity verified via armband and patients read back of name and date of birth,  assisted onto table.  Bony prominences padded, attached to monitor and 02 via NC.  Instructed on Mediport insertion  procedure and her role during procedure.  Verbalized good understanding to all.

## 2012-10-25 NOTE — Sedation Documentation (Signed)
Patient taken off the table per Dr. Derry Lory due to a emergent situation with another patient.  Explained to patient the situation and for her comfort she was being assisted off the hard table.  Verbalized good understanding to all.  Patient stated that she did not want to wait and would reschedule her procedure for another day.  Taken to recovery bay #4.

## 2012-11-06 NOTE — Discharge Summary (Unsigned)
ATTENDING PHYSICIAN:                 Staci Righter, MD            DATE OF ADMISSION:                   11/03/2003      DATE OF DISCHARGE:                   11/06/2003            ADMISSION DIAGNOSIS:  Gallstone pancreatitis.            DISCHARGE DIAGNOSES      1.   Gallstone pancreatitis.      2.   Hyperlipidemia.      3.   Episode of supraventricular tachycardia while under anesthesia.            PROCEDURE PERFORMED:  Endoscopic retrograde cholangiopancreatogram.            HOSPITAL COURSE:  This 78 year old woman was admitted to the hospital after      she experienced severe nausea, vomiting and abdominal pain.  Her initial      lipase value was greater than 2000 with a normal liver function.  The      patient underwent an ultrasound of the abdomen as well as CT scan which did      not show any evidence of gallstones or cholelithiasis or cholecystitis.      ERCP was aborted when the patient, under sedation, developed episode of SVT      and hypotension.  Therefore, the procedure had to be aborted.  A followup      MRI of the abdomen did not show any sludging or any mass to suggest that      the patient had an obstruction secondary to mass or tumor.  It is presumed      that the patient had gallstone pancreatitis and spontaneously passed her      gallstones.  The patient improved the following day after she was admitted,      with normalization in her amylase and lipase.  The patient was found to      have an elevated cholesterol of 275.  Her triglycerides, however, were not      elevated.  The patient was recommended to follow up with her primary care      physician for hyperlipidemia.  The patient was discharged home in good      condition, on no medication, instructed to eat                                                                              a regular diet, activity as tolerated, to follow up with her primary care      physician, as mentioned above.                  ______________________________      Date Signed: __________      Staci Righter, MD            [id number]      Dictated:        11/06/2003  1:00 P      Transcribed:     11/06/2003  2:42 P      Job Number:     [id number]      Document Number: [id number]            CC:  Staci Righter, MD           Mack Hook, MD

## 2012-11-06 NOTE — Op Note (Unsigned)
ROOM NUMBER:                           [id number]  [id number]            SURGEON:                                Cathie Hoops, MD            DATE:                                   11/05/2003                        PREOPERATIVE DIAGNOSIS:  Pancreatitis.            POSTOPERATIVE DIAGNOSIS:  Procedure terminated due to SVT.  Nondiagnostic      exam.            OPERATION:  Endoscopic retrograde cholangiopancreatography (ERCP).            ANESTHESIA:  LMAC.            PROCEDURE:  After the procedure and its risks were reviewed with the      patient, she was laid in the prone position.  The endoscope was then      inserted and advanced to the second portion of the duodenum without      difficulty.            Findings on slow withdrawal showed the major papilla to be normal.  Triple      lumen sphincterotome was then used to cannulate the biliary orifice.  A      0.035 inch Al Pimple was used for cannulation.  Contrast was injected.            At this point the patient developed a stable SVT with rates in the 140s.      Her systolic blood pressure also dropped to 85.  At this point it was      decided to terminate the procedure.            The endoscope was then withdrawn.  The patient tolerated the procedure well      without any acute complications.                                          ______________________________     Date Signed: __________      Cathie Hoops, MD                  C H:cf      Dictated:    11/05/2003  6:01 P      Transcribed: 11/06/2003  1:26 P      Job Number: [id number]      Document Number: [id number]            CC:  Cathie Hoops, MD           Staci Righter, MD

## 2012-11-14 DIAGNOSIS — C801 Malignant (primary) neoplasm, unspecified: Secondary | ICD-10-CM

## 2012-11-14 DIAGNOSIS — M545 Low back pain, unspecified: Secondary | ICD-10-CM

## 2012-11-14 HISTORY — DX: Malignant (primary) neoplasm, unspecified: C80.1

## 2012-11-14 HISTORY — DX: Low back pain, unspecified: M54.50

## 2012-11-29 ENCOUNTER — Encounter: Admission: RE | Payer: Self-pay | Source: Ambulatory Visit

## 2012-11-29 ENCOUNTER — Ambulatory Visit: Admission: RE | Admit: 2012-11-29 | Payer: Medicare Other | Source: Ambulatory Visit | Admitting: Diagnostic Radiology

## 2012-11-29 SURGERY — MEDIPORT PLACEMENT

## 2013-01-09 ENCOUNTER — Ambulatory Visit: Admission: RE | Admit: 2013-01-09 | Payer: Medicare Other | Source: Ambulatory Visit | Admitting: Diagnostic Radiology

## 2013-01-09 ENCOUNTER — Encounter: Admission: RE | Payer: Self-pay | Source: Ambulatory Visit

## 2013-01-09 SURGERY — MEDIPORT PLACEMENT

## 2013-01-18 ENCOUNTER — Emergency Department: Payer: Medicare Other

## 2013-01-18 ENCOUNTER — Emergency Department
Admission: EM | Admit: 2013-01-18 | Discharge: 2013-01-18 | Disposition: A | Discharge status: Home / Self Care | Payer: Medicare Other | Attending: Emergency Medical Services | Admitting: Emergency Medical Services

## 2013-01-18 DIAGNOSIS — M549 Dorsalgia, unspecified: Secondary | ICD-10-CM | POA: Insufficient documentation

## 2013-01-18 DIAGNOSIS — Z9089 Acquired absence of other organs: Secondary | ICD-10-CM | POA: Insufficient documentation

## 2013-01-18 DIAGNOSIS — N390 Urinary tract infection, site not specified: Secondary | ICD-10-CM | POA: Insufficient documentation

## 2013-01-18 LAB — URINALYSIS WITH MICROSCOPIC
Bilirubin, UA: NEGATIVE
Blood, UA: NEGATIVE
Glucose, UA: NEGATIVE
Ketones UA: NEGATIVE
Nitrite, UA: NEGATIVE
Protein, UR: NEGATIVE
Specific Gravity UA: 1.004 (ref 1.001–1.035)
Urine pH: 6.5 (ref 5.0–8.0)
Urobilinogen, UA: NORMAL mg/dL

## 2013-01-18 LAB — CBC AND DIFFERENTIAL
Basophils Absolute Automated: 0.02 10*3/uL (ref 0.00–0.20)
Basophils Automated: 0 %
Eosinophils Absolute Automated: 0.07 10*3/uL (ref 0.00–0.70)
Eosinophils Automated: 1 %
Hematocrit: 39.8 % (ref 37.0–47.0)
Hgb: 13.7 g/dL (ref 12.0–16.0)
Immature Granulocytes Absolute: 0.01 10*3/uL
Immature Granulocytes: 0 %
Lymphocytes Absolute Automated: 1.29 10*3/uL (ref 0.50–4.40)
Lymphocytes Automated: 21 %
MCH: 30.2 pg (ref 28.0–32.0)
MCHC: 34.4 g/dL (ref 32.0–36.0)
MCV: 87.7 fL (ref 80.0–100.0)
MPV: 10.4 fL (ref 9.4–12.3)
Monocytes Absolute Automated: 0.95 10*3/uL (ref 0.00–1.20)
Monocytes: 16 %
Neutrophils Absolute: 3.77 10*3/uL (ref 1.80–8.10)
Neutrophils: 62 %
Nucleated RBC: 0 /100 WBC (ref 0–1)
Platelets: 273 10*3/uL (ref 140–400)
RBC: 4.54 10*6/uL (ref 4.20–5.40)
RDW: 12 % (ref 12–15)
WBC: 6.1 10*3/uL (ref 3.50–10.80)

## 2013-01-18 LAB — COMPREHENSIVE METABOLIC PANEL
ALT: 11 U/L (ref 0–55)
AST (SGOT): 23 U/L (ref 5–34)
Albumin/Globulin Ratio: 1.3 (ref 0.9–2.2)
Albumin: 3.9 g/dL (ref 3.5–5.0)
Alkaline Phosphatase: 72 U/L (ref 40–150)
Anion Gap: 8 (ref 5.0–15.0)
BUN: 13 mg/dL (ref 7–19)
Bilirubin, Total: 0.5 mg/dL (ref 0.2–1.2)
CO2: 24 mEq/L (ref 22–29)
Calcium: 9.1 mg/dL (ref 7.9–10.6)
Chloride: 102 mEq/L (ref 98–107)
Creatinine: 0.7 mg/dL (ref 0.6–1.0)
Globulin: 2.9 g/dL (ref 2.0–3.6)
Glucose: 82 mg/dL (ref 70–100)
Potassium: 4 mEq/L (ref 3.5–5.1)
Protein, Total: 6.8 g/dL (ref 6.0–8.3)
Sodium: 134 mEq/L — ABNORMAL LOW (ref 136–145)

## 2013-01-18 LAB — CK: Creatine Kinase (CK): 50 U/L (ref 29–168)

## 2013-01-18 LAB — GFR: EGFR: >60

## 2013-01-18 LAB — TROPONIN I: Troponin I: <0.01 ng/mL (ref 0.00–0.09)

## 2013-01-18 LAB — LIPASE: Lipase: 18 U/L (ref 8–78)

## 2013-01-18 MED ORDER — MORPHINE SULFATE 4 MG/ML IJ/IV SOLN (WRAP)
4.0000 mg | Freq: Once | Status: AC
Start: 2013-01-18 — End: 2013-01-18
  Administered 2013-01-18: 4 mg via INTRAVENOUS
  Filled 2013-01-18: qty 1

## 2013-01-18 MED ORDER — ONDANSETRON 4 MG PO TBDP
4.0000 mg | ORAL_TABLET | Freq: Four times a day (QID) | ORAL | Status: AC | PRN
Start: 2013-01-18 — End: 2013-01-25

## 2013-01-18 MED ORDER — ONDANSETRON 4 MG PO TBDP
4.0000 mg | ORAL_TABLET | Freq: Once | ORAL | Status: AC
Start: 2013-01-18 — End: 2013-01-18

## 2013-01-18 MED ORDER — IOHEXOL 350 MG/ML IV SOLN
75.0000 mL | Freq: Once | INTRAVENOUS | Status: AC | PRN
Start: 2013-01-18 — End: 2013-01-18
  Administered 2013-01-18: 75 mL via INTRAVENOUS

## 2013-01-18 MED ORDER — ONDANSETRON 4 MG PO TBDP
ORAL_TABLET | ORAL | Status: AC
Start: 2013-01-18 — End: 2013-01-18
  Administered 2013-01-18: 4 mg via ORAL
  Filled 2013-01-18: qty 1

## 2013-01-18 MED ORDER — SODIUM CHLORIDE 0.9 % IV BOLUS
1000.0000 mL | Freq: Once | INTRAVENOUS | Status: AC
Start: 2013-01-18 — End: 2013-01-18
  Administered 2013-01-18: 1000 mL via INTRAVENOUS

## 2013-01-18 MED ORDER — ONDANSETRON HCL 4 MG/2ML IJ SOLN
4.0000 mg | Freq: Once | INTRAMUSCULAR | Status: AC
Start: 2013-01-18 — End: 2013-01-18
  Administered 2013-01-18: 4 mg via INTRAVENOUS
  Filled 2013-01-18: qty 2

## 2013-01-18 MED ORDER — HYDROCODONE-ACETAMINOPHEN 5-300 MG PO TABS
1.0000 | ORAL_TABLET | Freq: Four times a day (QID) | ORAL | Status: DC | PRN
Start: 2013-01-18 — End: 2014-03-05

## 2013-01-18 MED ORDER — NITROFURANTOIN MONOHYD MACRO 100 MG PO CAPS
100.00 mg | ORAL_CAPSULE | Freq: Two times a day (BID) | ORAL | Status: AC
Start: 2013-01-18 — End: 2013-01-23

## 2013-01-18 NOTE — ED Provider Notes (Addendum)
Physician/Midlevel provider first contact with patient: 01/18/13 1610         History     Chief Complaint   Patient presents with   . Back Pain   . Nausea     Pt has pain mid back for several days  Has h/o similar pain years ago was pancreatitis and earlier last years uti.    Pain is worse today and more constant.   Pt notes was vacuuming earlier tu before this began.    No cough or fever.  Does hurt to breathe  No cp.    Mild nausea anorexia today.    History per pt  No modifiers  Neg other asso      Past Medical History   Diagnosis Date   . Pancreatitis    . Back pain    . Cancer        Past Surgical History   Procedure Date   . Appendectomy        History reviewed. No pertinent family history.    Social  History   Substance Use Topics   . Smoking status: Never Smoker    . Smokeless tobacco: Not on file   . Alcohol Use: 4.2 oz/week     7 Glasses of wine per week       .     Allergies   Allergen Reactions   . Aspirin Other (See Comments)     Oral and stomach irritation if taken daily   . Codeine Nausea Only       Current/Home Medications    CHOLECALCIFEROL (VITAMIN D-3 PO)    Take by mouth.      CYANOCOBALAMIN (VITAMIN B-12 PO)    Take by mouth.      MAGNESIUM PO    Take by mouth.      PSEUDOEPHEDRINE HCL (SUDAFED PO)    Take by mouth as needed.          Review of Systems   Constitutional: Negative for fever.   HENT: Negative for congestion.    Respiratory: Negative for shortness of breath.    Cardiovascular: Negative for chest pain.   Gastrointestinal: Positive for nausea. Negative for abdominal pain.   Genitourinary: Negative for dysuria.   Musculoskeletal: Positive for back pain.   Skin: Negative for rash.   Neurological: Negative for headaches.   Psychiatric/Behavioral: Negative for dysphoric mood.   All other systems reviewed and are negative.        Physical Exam    BP 162/77  Pulse 89  Temp 97.5 F (36.4 C) (Oral)  Resp 14  Ht 1.626 m  Wt 57.153 kg  BMI 21.62 kg/m2  SpO2 100%    Physical Exam    Constitutional:        Well Appearing   HENT:   Head: Normocephalic.   Eyes: Conjunctivae normal are normal.   Neck:        Normal Inspection   Cardiovascular: Normal rate, regular rhythm, normal heart sounds and intact distal pulses.    Pulmonary/Chest: Breath sounds normal.   Abdominal: Soft. Bowel sounds are normal. There is no tenderness.   Musculoskeletal: She exhibits no edema and no tenderness.        Nl back inspection     Neurological: She is alert. She has normal strength.   Skin: Skin is warm and dry.   Psychiatric: She has a normal mood and affect.       MDM and ED Course  ED Medication Orders      Start     Status Ordering Provider    01/18/13 2245   ondansetron (ZOFRAN) injection 4 mg   Once      Route: Intravenous  Ordered Dose: 4 mg         Jennefer Bravo    01/18/13 1945   sodium chloride 0.9 % bolus 1,000 mL   Once      Route: Intravenous  Ordered Dose: 1,000 mL         Last MAR action:  New Bag Chase Picket    01/18/13 1945   morphine injection 4 mg   Once      Route: Intravenous  Ordered Dose: 4 mg         Last MAR action:  Given Brady Plant J                 MDM  Will cover for uti  Suspect back pain from vacuuming      Procedures    Clinical Impression & Disposition     Clinical Impression  Final diagnoses:   Back pain, acute   UTI (urinary tract infection)        ED Disposition     Discharge Maureen Vasquez discharge to home/self care.    Condition at discharge: Good             New Prescriptions    HYDROCODONE-ACETAMINOPHEN (VICODIN) 5-300 MG TABS    Take 1-2 tablets by mouth every 6 (six) hours as needed.    NITROFURANTOIN, MACROCRYSTAL-MONOHYDRATE, (MACROBID) 100 MG CAPSULE    Take 1 capsule (100 mg total) by mouth 2 (two) times daily.    ONDANSETRON (ZOFRAN ODT) 4 MG DISINTEGRATING TABLET    Take 1 tablet (4 mg total) by mouth 4 (four) times daily as needed for Nausea.               Chase Picket, MD  01/18/13 2046    Chase Picket, MD  01/18/13 2222

## 2013-01-18 NOTE — Discharge Instructions (Signed)
Urinary Tract Infection    You have been diagnosed with a lower urinary tract infection (UTI). This is also called cystitis.    Cystitis is an infection in your bladder. Your doctor diagnosed it by testing your urine. Cystitis usually causes burning with urination or frequent urination. It might make you feel like you have to urinate even when you don't.     Cystitis is usually treated with antibiotics and medicine to help with pain.    It is VERY IMPORTANT that you fill your prescription and take all of the antibiotics as directed. If a lower urinary tract infection goes untreated for too long, it can become a kidney infection.    FOR WOMEN: To reduce the risk of getting cystitis again:   Always urinate before and after sexual intercourse.   Always wipe from front to back after urinating or having a bowel movement. Do not wipe from back to front.   Drink plenty of fluids. Try to drink cranberry or blueberry juice. These juices have a chemical that stops bacteria from "sticking" to the bladder.    YOU SHOULD SEEK MEDICAL ATTENTION IMMEDIATELY, EITHER HERE OR AT THE NEAREST EMERGENCY DEPARTMENT, IF ANY OF THE FOLLOWING OCCURS:   You have a fever or shaking chills.   You feel nauseated or vomit.   You have pain in your side or back.   You don't get better after taking all of your antibiotics.   You have any new symptoms or concerns.   You feel worse or do not improve.    Back Pain NOS    You have been seen for back pain.    Back pain can happen anywhere from the neck down to the low back. Back pain has many different causes. Some of the more common are: Bone pain, muscle strain, muscle spasm, pain from overuse, and pinched nerves. Other problems can cause what feels like back pain. But the pain is really coming from another organ. This could be a kidney infection. This can cause lower back pain.    Your doctor did not find any pain over the bones in your back (even though you might have pain in the  muscles of the back). This means it is very unlikely that you have a broken bone (fracture) in your back. Your doctor did not think it was necessary to take an x-ray.    The doctor still does not know the exact cause of your pain. Your problem does not seem to be from a dangerous cause. It is safe for you to go home today.    Some things you can try to help your back feel better are:   Apply a warm damp washcloth to the back where you have pain for 20 minutes at a time, at least 4 times per day.    Have someone massage the sore parts of your back.    Don t do any heavy lifting or bending. You can go back to normal daily activities if they don t make the pain worse.   You can use anti-inflammatory pain medicine for your pain. This could be Ibuprofen (Advil or Motrin). You can buy these at most stores. Follow the directions on the package.    This pain may last for the next few days. If your pain gets better, you probably do not need to see a doctor. However, if your symptoms get worse or you have new symptoms, you should return here or go to the nearest   Emergency Department.    Call your physician or go to the nearest Emergency Department if you your pain does not improve within 4 weeks or your pain is bad enough to seriously limit your normal activities.    YOU SHOULD SEEK MEDICAL ATTENTION IMMEDIATELY, EITHER HERE OR AT THE NEAREST EMERGENCY DEPARTMENT, IF ANY OF THE FOLLOWING OCCURS:   - You think the pain is coming from somewhere other than your back. This can include chest pain. This is sometimes from angina (heart pains) or other dangerous causes.   You have shortness of breath, sweating, chest pain (or pressure, heaviness, indigestion, etc).   You have abdominal (belly) pain that goes through to your back.   Your arms and legs tingle or get numb (lose feeling).   Your arms or legs are weak.   You lose control of your bladder or bowels. If this were to happen, it may cause you to wet or soil  yourself.    You have problems urinating (peeing).   You have fevers (a temperature of more than 100.4F or 38C).   Your pain gets worse.

## 2013-01-18 NOTE — ED Notes (Signed)
Pt given SL Zofran to go. Pt denies other needs. Husband to get prescriptions filled in the am.

## 2013-01-18 NOTE — ED Notes (Signed)
Severe "10" pain between shoulder blades since Tuesday, accompanied by nausea.  Pain is worsening today.

## 2013-01-19 LAB — ECG 12-LEAD
Atrial Rate: 78 {beats}/min
P Axis: 69 degrees
P-R Interval: 200 ms
Q-T Interval: 356 ms
QRS Duration: 80 ms
QTC Calculation (Bezet): 405 ms
R Axis: 57 degrees
T Axis: 65 degrees
Ventricular Rate: 78 {beats}/min

## 2013-01-23 ENCOUNTER — Other Ambulatory Visit: Payer: Self-pay | Admitting: Nurse Practitioner

## 2013-01-23 DIAGNOSIS — R109 Unspecified abdominal pain: Secondary | ICD-10-CM

## 2013-01-24 ENCOUNTER — Ambulatory Visit
Admission: RE | Admit: 2013-01-24 | Discharge: 2013-01-24 | Disposition: A | Discharge status: Home / Self Care | Payer: Medicare Other | Source: Ambulatory Visit | Attending: Nurse Practitioner | Admitting: Nurse Practitioner

## 2013-01-24 ENCOUNTER — Other Ambulatory Visit: Payer: Medicare Other

## 2013-01-24 DIAGNOSIS — R109 Unspecified abdominal pain: Secondary | ICD-10-CM

## 2013-08-19 ENCOUNTER — Emergency Department (INDEPENDENT_AMBULATORY_CARE_PROVIDER_SITE_OTHER)
Admission: EM | Admit: 2013-08-19 | Discharge: 2013-08-19 | Disposition: A | Discharge status: Home / Self Care | Payer: Medicare Other | Source: Home / Self Care | Attending: Family Medicine | Admitting: Family Medicine

## 2013-08-19 ENCOUNTER — Emergency Department (INDEPENDENT_AMBULATORY_CARE_PROVIDER_SITE_OTHER): Payer: Medicare Other

## 2013-08-19 ENCOUNTER — Encounter (HOSPITAL_COMMUNITY): Payer: Self-pay | Admitting: Emergency Medicine

## 2013-08-19 ENCOUNTER — Emergency Department
Admission: EM | Admit: 2013-08-19 | Discharge: 2013-08-19 | Disposition: A | Discharge status: Home / Self Care | Payer: Medicare Other | Attending: Emergency Medical Services | Admitting: Emergency Medical Services

## 2013-08-19 ENCOUNTER — Emergency Department: Payer: Medicare Other

## 2013-08-19 DIAGNOSIS — Z9089 Acquired absence of other organs: Secondary | ICD-10-CM | POA: Insufficient documentation

## 2013-08-19 DIAGNOSIS — R1013 Epigastric pain: Secondary | ICD-10-CM | POA: Insufficient documentation

## 2013-08-19 DIAGNOSIS — Z859 Personal history of malignant neoplasm, unspecified: Secondary | ICD-10-CM | POA: Insufficient documentation

## 2013-08-19 DIAGNOSIS — Z885 Allergy status to narcotic agent status: Secondary | ICD-10-CM | POA: Insufficient documentation

## 2013-08-19 DIAGNOSIS — M549 Dorsalgia, unspecified: Secondary | ICD-10-CM | POA: Insufficient documentation

## 2013-08-19 DIAGNOSIS — J309 Allergic rhinitis, unspecified: Secondary | ICD-10-CM

## 2013-08-19 DIAGNOSIS — J302 Other seasonal allergic rhinitis: Secondary | ICD-10-CM

## 2013-08-19 LAB — COMPREHENSIVE METABOLIC PANEL
ALT: 16 U/L (ref 0–55)
AST (SGOT): 30 U/L (ref 5–34)
Albumin/Globulin Ratio: 1.5 (ref 0.9–2.2)
Albumin: 4 g/dL (ref 3.5–5.0)
Alkaline Phosphatase: 79 U/L (ref 40–150)
Anion Gap: 9 (ref 5.0–15.0)
BUN: 9 mg/dL (ref 7–19)
Bilirubin, Total: 0.7 mg/dL (ref 0.2–1.2)
CO2: 23 (ref 22–29)
Calcium: 9.2 mg/dL (ref 7.9–10.6)
Chloride: 101 (ref 98–107)
Creatinine: 0.6 mg/dL (ref 0.6–1.0)
Globulin: 2.7 g/dL (ref 2.0–3.6)
Glucose: 87 mg/dL (ref 70–100)
Potassium: 4 (ref 3.5–5.1)
Protein, Total: 6.7 g/dL (ref 6.0–8.3)
Sodium: 133 — ABNORMAL LOW (ref 136–145)

## 2013-08-19 LAB — CBC AND DIFFERENTIAL
Basophils Absolute Automated: 0.02 (ref 0.00–0.20)
Basophils Automated: 0 %
Eosinophils Absolute Automated: 0.07 (ref 0.00–0.70)
Eosinophils Automated: 1 %
Hematocrit: 38.9 % (ref 37.0–47.0)
Hgb: 13.9 g/dL (ref 12.0–16.0)
Immature Granulocytes Absolute: 0.02
Immature Granulocytes: 0 %
Lymphocytes Absolute Automated: 1.5 (ref 0.50–4.40)
Lymphocytes Automated: 24 %
MCH: 30.5 pg (ref 28.0–32.0)
MCHC: 35.7 g/dL (ref 32.0–36.0)
MCV: 85.3 fL (ref 80.0–100.0)
MPV: 10.2 fL (ref 9.4–12.3)
Monocytes Absolute Automated: 0.72 (ref 0.00–1.20)
Monocytes: 11 %
Neutrophils Absolute: 4.08 (ref 1.80–8.10)
Neutrophils: 64 %
Nucleated RBC: 0 (ref 0–1)
Platelets: 275 (ref 140–400)
RBC: 4.56 (ref 4.20–5.40)
RDW: 12 % (ref 12–15)
WBC: 6.39 (ref 3.50–10.80)

## 2013-08-19 LAB — URINALYSIS, REFLEX TO MICROSCOPIC EXAM IF INDICATED
Bilirubin, UA: NEGATIVE
Blood, UA: NEGATIVE
Glucose, UA: NEGATIVE
Ketones UA: NEGATIVE
Leukocyte Esterase, UA: NEGATIVE
Nitrite, UA: NEGATIVE
Protein, UR: NEGATIVE
Specific Gravity UA: 1.006 (ref 1.001–1.035)
Urine pH: 7.5 (ref 5.0–8.0)
Urobilinogen, UA: NORMAL mg/dL

## 2013-08-19 LAB — LIPASE: Lipase: 11 U/L (ref 8–78)

## 2013-08-19 LAB — GFR: EGFR: >60

## 2013-08-19 LAB — TROPONIN I: Troponin I: <0.01 ng/mL (ref 0.00–0.09)

## 2013-08-19 LAB — CK: Creatine Kinase (CK): 64 U/L (ref 29–168)

## 2013-08-19 MED ORDER — FLUTICASONE PROPIONATE 50 MCG/ACT NA SUSP: 2.0000 | Freq: Two times a day (BID) | NASAL | Status: DC

## 2013-08-19 MED ORDER — HYDROCOD POLST-CHLORPHEN POLST 10-8 MG/5ML PO LQCR: 5.0000 mL | Freq: Two times a day (BID) | ORAL | Status: DC | PRN

## 2013-08-19 MED ORDER — ONDANSETRON HCL 4 MG/2ML IJ SOLN
4.00 mg | Freq: Once | INTRAMUSCULAR | Status: AC
Start: 2013-08-19 — End: 2013-08-19
  Administered 2013-08-19: 4 mg via INTRAVENOUS
  Filled 2013-08-19: qty 2

## 2013-08-19 MED ORDER — ONDANSETRON HCL 4 MG/2ML IJ SOLN
4.0000 mg | Freq: Once | INTRAMUSCULAR | Status: AC
Start: 2013-08-19 — End: 2013-08-19
  Administered 2013-08-19: 4 mg via INTRAVENOUS
  Filled 2013-08-19: qty 2

## 2013-08-19 MED ORDER — HYDROMORPHONE HCL PF 1 MG/ML IJ SOLN
0.5000 mg | Freq: Once | INTRAMUSCULAR | Status: AC
Start: 2013-08-19 — End: 2013-08-19
  Administered 2013-08-19: 0.5 mg via INTRAVENOUS
  Filled 2013-08-19: qty 1

## 2013-08-19 MED ORDER — ONDANSETRON 4 MG PO TBDP
4.0000 mg | ORAL_TABLET | Freq: Four times a day (QID) | ORAL | Status: DC | PRN
Start: 2013-08-19 — End: 2014-02-28

## 2013-08-19 MED ORDER — SODIUM CHLORIDE 0.9 % IV BOLUS
250.0000 mL | Freq: Once | INTRAVENOUS | Status: AC
Start: 2013-08-19 — End: 2013-08-19
  Administered 2013-08-19: 250 mL via INTRAVENOUS

## 2013-08-19 MED ORDER — HYDROCODONE-ACETAMINOPHEN 5-325 MG PO TABS
1.0000 | ORAL_TABLET | Freq: Four times a day (QID) | ORAL | Status: DC | PRN
Start: 2013-08-19 — End: 2014-03-05

## 2013-08-19 NOTE — ED Notes (Signed)
C/o cold symptoms since Thursday.  Patient states she has a cough which is dry, congestion, feeling weak, sore throat and tongue and mouth is swelling.

## 2013-08-19 NOTE — Discharge Instructions (Signed)
Dear Maureen Vasquez:    I appreciate your choosing the [facility] for your healthcare needs, and hope your visit today was EXCELLENT.    Instructions:  Please follow-up with Dr. Melvyn Neth, your primary care doctor, later this week.     Please return at once with worsening symptoms.     Below is some information that our patients often find helpful.    We wish you good health and please do not hesitate to contact us if we can ever be of any assistance.    Sincerely,  Clovis Riley, MD  [facility]    ________________________________________________________________    If you do not continue to improve or your condition worsens, please contact your doctor or return immediately to the Emergency Department.    Thank you for choosing [facility] for your emergency care needs.  We strive to provide EXCELLENT care to you and your family.      DOCTOR REFERRALS  Call [phone] if you need any further referrals and we can help you find a primary care doctor or specialist.  Also, available online at:  [email or web address]    YOUR CONTACT INFORMATION  Before leaving please check with registration to make sure we have an up-to-date contact number.  You can call registration at [phone] to update your information.  For questions about your hospital bill, please call [phone].  For questions about your Emergency Dept Physician bill please call [phone].      FREE HEALTH SERVICES  If you need help with health or social services, please call 2-1-1 for a free referral to resources in your area.  2-1-1 is a free service connecting people with information on health insurance, free clinics, pregnancy, mental health, dental care, food assistance, housing, and substance abuse counseling.  Also, available online at:  http://[email or web address].org    MEDICAL RECORDS AND TESTS  Certain laboratory test results do not come back the same day, for  example urine cultures.   We will contact you if other important findings are noted.  Radiology films are often reviewed again to ensure accuracy.  If there is any discrepancy, we will notify you.      Please call [phone] to pick up a complimentary CD of any radiology studies performed.  If you or your doctor would like to request a copy of your medical records, please call [phone].      ORTHOPEDIC INJURY   Please know that significant injuries can exist even when an initial x-ray is read as normal or negative.  This can occur because some fractures (broken bones) are not initially visible on x-rays.  For this reason, close outpatient follow-up with your primary care doctor or bone specialist (orthopedist) is required.    MEDICATIONS AND FOLLOWUP  Please be aware that some prescription medications can cause drowsiness.  Use caution when driving or operating machinery.    The examination and treatment you have received in our Emergency Department is provided on an emergency basis, and is not intended to be a substitute for your primary care physician.  It is important that your doctor checks you again and that you report any new or remaining problems at that time.      24 HOUR PHARMACIES  [facility] - [address] [place], [place] [address] (1.4 miles, 7 minutes)  [facility] - [address], [place], [place] [address] (6.5 miles, 13 minutes)  Handout with directions  available on request

## 2013-08-19 NOTE — ED Provider Notes (Addendum)
Physician/Midlevel provider first contact with patient: 08/19/13 1654         EMERGENCY DEPARTMENT HISTORY AND PHYSICAL EXAM    Date: 08/20/2013  Patient Name: Maureen Vasquez  Attending Physician: Clovis Riley, MD      History of Presenting Illness     Chief Complaint:   Chief Complaint   Patient presents with   . Back Pain       Historian:  Patient  Onset:   Gradual    79 y.o. female h/o back pain, pancreatitis, p/w persistent mid thoracic back pain (R>L) that started 1 day PTA. Pain worsens with cough. Reports pain reminds her of when she previously had a UTI 7 months ago. States pain may have been aggravated by pulling open a barn door. Patient also p/w epigastric abdominal discomfort associated with some discomfort swallowing. Insignificant relief with Tylenol and magnesium tablet. Patient reports only takes 1/2 Tylenol at a time, last dose at 1130 today. Denies any urinary symptoms.     PMD:  Mack Hook, MD    Past Medical History     Past Medical History   Diagnosis Date   . Pancreatitis    . Back pain    . Cancer        Past Surgical History     Past Surgical History   Procedure Date   . Appendectomy        Family History     Family History   Problem Relation Age of Onset   . Heart attack Mother    . Heart disease Mother    . Parkinsonism Mother    . Hyperlipidemia Mother    . Heart disease Sister    . Heart disease Brother        Social History     History     Social History   . Marital Status: Married     Spouse Name: N/A     Number of Children: N/A   . Years of Education: N/A     Social History Main Topics   . Smoking status: Never Smoker    . Smokeless tobacco: Not on file   . Alcohol Use: 4.2 oz/week     7 Glasses of wine per week   . Drug Use: No   . Sexually Active: Not on file     Other Topics Concern   . Not on file     Social History Narrative   . No narrative on file       Allergies     Allergies   Allergen Reactions   . Aspirin Other (See Comments)     Oral and stomach irritation if taken  daily   . Codeine Nausea Only       Home Medications     Home medications reviewed by ED MD at 4:54 PM     Previous Medications    ACETAMINOPHEN (TYLENOL) 325 MG TABLET    Take 650 mg by mouth every 6 (six) hours as needed.    ALOE VERA JUICE PO    Take by mouth.    CHOLECALCIFEROL (VITAMIN D-3 PO)    Take by mouth.      CYANOCOBALAMIN (VITAMIN B-12 PO)    Take by mouth.      HYDROCODONE-ACETAMINOPHEN (VICODIN) 5-300 MG TABS    Take 1-2 tablets by mouth every 6 (six) hours as needed.    MAGNESIUM PO    Take by mouth.      PSEUDOEPHEDRINE  HCL (SUDAFED PO)    Take by mouth as needed.         Review of Systems     Constitutional:  No fever  ENT: +discomfort swallowing  GI: +epigastric abdominal discomfort  GU: No urinary symptoms  MS:  +thoracic back pain (R>L)  All other systems reviewed and negative     Physical Exam     CONSTITUTIONAL   Vital Signs Reviewed, Patient has normal pulse, Afebrile.   HEAD   Atraumatic, Normocephalic.  EYES   Eyes are normal to inspection, No discharge from eyes.  ENT    No pharyngeal edema.  No drooling  NECK   Normal ROM, No jugular venous distention.  RESPIRATORY CHEST   Breath sounds normal, No respiratory distress.  CARDIOVASCULAR   RRR, Heart sounds normal.  ABDOMEN   Mild epigastric tenderness, No masses, Bowel sounds normal, No distension.  BACK   Right sided thoracic tenderness.  UPPER EXTREMITY   Inspection normal, No cyanosis.  LOWER EXTREMITY   Inspection normal, No cyanosis, No edema, No calf tenderness.  NEURO   No focal motor deficits, No focal sensory deficits.  SKIN   Skin is warm, Skin is dry, Skin is normal color.  PSYCHIATRIC   Oriented X 3. Normal affect, Normal insight, Normal concentration.    ED Medications Administered     ED Medication Orders      Start     Status Ordering Provider    08/19/13 1838   ondansetron [facility]) injection 4 mg   Once      Route: Intravenous  Ordered Dose: 4 mg         Last MAR action:  Given Soliyana Mcchristian W    08/19/13 1725    HYDROmorphone (DILAUDID) injection 0.5 mg   Once      Route: Intravenous  Ordered Dose: 0.5 mg         Last MAR action:  Given Daylah Sayavong W    08/19/13 1725   ondansetron (ZOFRAN) injection 4 mg   Once      Route: Intravenous  Ordered Dose: 4 mg         Last MAR action:  Given Charonda Hefter W    08/19/13 1725   sodium chloride 0.9 % bolus 250 mL   Once      Route: Intravenous  Ordered Dose: 250 mL         Last MAR action:  Stopped Kayleana Waites W                Orders Placed During This Encounter     Orders Placed This Encounter   Procedures   . Urine Culture   . Chest AP Portable   . UA (Reflex to Microscopic)   . CBC with Differential   . Comprehensive Metabolic Panel (CMP)   . Lipase   . CK with MB if Indicated   . Troponin I   . GFR   . ECG 12 Lead       Data Review     Nursing records reviewed and agree: Yes    Laboratory results reviewed by ED provider (yes/no): Yes  Radiologic study results reviewed by ED provider (yes/no): Yes    I, Clovis Riley, MD, personally performed the services documented.  Jinho Thereasa Parkin is scribing for me on [facility]. I reviewed and confirm the accuracy of the information in this medical record.     I, Johnny Bridge, am serving as  a scribe to document services personally performed by Clovis Riley, MD, based on the provider's statements to me.     Rendering Provider: Clovis Riley, MD    Diagnostic Study Results     Cardiac Monitor (interpreted by ED physician):  @1818  - NSR, Rate at 70 bpm, No ectopy, No ST abnormality.     EKG (interpreted by ED physician):  @1734  - NSR, Rate at 70 bpm, Normal axis and intervals, Nonspecific T wave abnormality, Slightly abnormal EKG.     Labs     Results     Procedure Component Value Units Date/Time    Troponin I [[id number]] Collected:08/19/13 1737    Specimen Information:Blood Updated:08/19/13 1811     Troponin I <0.01 ng/mL     Comprehensive Metabolic Panel (CMP) [[id number]]  (Abnormal) Collected:08/19/13 1737    Specimen  Information:Blood Updated:08/19/13 1810     Glucose 87 mg/dL      BUN 9 mg/dL      Creatinine 0.6 mg/dL      Sodium 469 (L)      Potassium 4.0      Chloride 101      CO2 23      CALCIUM 9.2 mg/dL      Protein, Total 6.7 g/dL      Albumin 4.0 g/dL      AST (SGOT) 30 U/L      ALT 16 U/L      Alkaline Phosphatase 79 U/L      Bilirubin, Total 0.7 mg/dL      Globulin 2.7 g/dL      Albumin/Globulin Ratio 1.5      Anion Gap 9.0     Lipase [[id number]] Collected:08/19/13 1737    Specimen Information:Blood Updated:08/19/13 1810     Lipase 11 U/L     CK with MB if Indicated [[id number]] Collected:08/19/13 1737    Specimen Information:Blood Updated:08/19/13 1810     Creatine Kinase (CK) 64 U/L     GFR [[id number]] Collected:08/19/13 1737     EGFR >60.0 Updated:08/19/13 1810    UA (Reflex to Microscopic) [[id number]] Collected:08/19/13 1737    Specimen Information:Urine Updated:08/19/13 1752     Urine Type Clean Catch      Color, UA Colorle      Clarity, UA Clear      Specific Gravity UA 1.006      Urine pH 7.5      Leukocyte Esterase, UA Negative      Nitrite, UA Negative      Protein, UA Negative      Glucose, UA Negative      Ketones UA Negative      Urobilinogen, UA Normal mg/dL      Bilirubin, UA Negative      Blood, UA Negative     CBC with Differential [[id number]] Collected:08/19/13 1737    Specimen Information:Blood / Blood Updated:08/19/13 1751     WBC 6.39      RBC 4.56      Hgb 13.9 g/dL      Hematocrit 18.8 %      MCV 85.3 fL      MCH 30.5 pg      MCHC 35.7 g/dL      RDW 12 %      Platelets 275      MPV 10.2 fL      Neutrophils 64 %      Lymphocytes Automated 24 %      Monocytes 11 %  Eosinophils Automated 1 %      Basophils Automated 0 %      Immature Granulocyte 0 %      Nucleated RBC 0      Neutrophils Absolute 4.08      Abs Lymph Automated 1.50      Abs Mono Automated 0.72      Abs Eos Automated 0.07      Absolute Baso Automated 0.02      Absolute Immature Granulocyte 0.02           Radiologic Studies  Radiology  Results (24 Hour)     Procedure Component Value Units Date/Time    Chest AP Portable [[id number]] Collected:08/19/13 1844    Order Status:Completed  Updated:08/19/13 1854    Narrative:    History:  Chest and right back pain.    COMPARISON: CT 01/18/2013, chest radiograph 03/17/2009    Portable Chest:  The heart size and mediastinum appear normal for the AP projection and  patient position.  Aorta is mildly calcific. The lungs demonstrate  basilar interstitial prominence and atelectasis but otherwise appear  clear. There is normal pulmonary vascularity.  No pleural effusion,  pneumothorax or acute fracture is evident on this study.      Impression:    Impression: Basilar interstitial prominence and atelectasis, otherwise  unremarkable chest      Geanie Cooley, MD   08/19/2013 6:50 PM      .    VS     Patient Vitals for the past 24 hrs:   BP Temp Pulse Resp SpO2 Height Weight   08/19/13 1933 173/77 mmHg - 74  20  98 % - -   08/19/13 1852 191/82 mmHg - 74  18  98 % - -   08/19/13 1743 164/74 mmHg - 84  22  100 % - -   08/19/13 1548 188/79 mmHg 97.6 F (36.4 C) 82  18  99 % 1.651 m 57.607 kg       MDM and Clinical Notes     @1828 : On reevaluation, patient has no tachypnea, tachycardia, or hypoxia. Symptoms are very much like 7 months ago when CTA was negative. I strongly doubt PE. Patient reports having had multiple CT scans in the past and prefers no CT.     Dizzy and nauseated  s/p IV Dilauadid.  Zofran repeated.  Will observe for a bit longer and hope to d/c w/ f/u this week    Diagnosis and Disposition     Clinical Impression  1. Back pain    2. Epigastric pain        Disposition  ED Disposition     Discharge ROMILDA PROBY discharge to home/self care.    Condition at disposition: Stable            Prescriptions  New Prescriptions    HYDROCODONE-ACETAMINOPHEN (NORCO) 5-325 MG PER TABLET    Take 1 tablet by mouth every 6 (six) hours as needed for Pain.    ONDANSETRON (ZOFRAN ODT) 4 MG DISINTEGRATING TABLET    Take  1 tablet (4 mg total) by mouth every 6 (six) hours as needed for Nausea.           Clovis Riley, MD  08/19/13 1913    Clovis Riley, MD  08/20/13 1045

## 2013-08-19 NOTE — ED Notes (Signed)
Pt walked to the BR without any difficulty and stated no dizziness, asymptomatic. Doc reynolds updated and aware

## 2013-08-19 NOTE — ED Notes (Signed)
Discharge dealyed because pt reported dizziness after dilaudid , per Dr. Littie Deeds wait 30 mins and reevaluate pt.

## 2013-08-19 NOTE — ED Notes (Signed)
Pt presents to the ED with mid back pain that started yesterday.  Pt states that this is similar to a UTI that she has had in the past.  Pt denies urinary complaints.

## 2013-08-19 NOTE — ED Notes (Signed)
Spoke with scribe and stated per Dr Thad Ranger pt can be discharged

## 2013-08-19 NOTE — ED Provider Notes (Signed)
CSN: [id number]     Arrival date & time 08/19/13  1304 History   First MD Initiated Contact with Patient 08/19/13 1426     Chief Complaint  Patient presents with  . URI   (Consider location/radiation/quality/duration/timing/severity/associated sxs/prior Treatment) Patient is a 79 y.o. female presenting with URI. The history is provided by the patient and a relative.  URI Presenting symptoms: congestion, cough, fever and sore throat   Presenting symptoms: no rhinorrhea   Duration:  5 days Progression:  Worsening Chronicity:  New Risk factors: being elderly   Risk factors: no recent illness and no sick contacts     Past Medical History  Diagnosis Date  . Essential and other specified forms of tremor   . Esophageal reflux   . Insomnia, unspecified   . Allergic rhinitis, cause unspecified   . Acute bronchitis    Past Surgical History  Procedure Laterality Date  . Total abdominal hysterectomy w/ bilateral salpingoophorectomy    . Bladder tack    . Colon polyps     Family History  Problem Relation Age of Onset  . Allergies Brother   . Allergies Sister   . Cancer Father   . Other Mother     fracture hip age 41   History  Substance Use Topics  . Smoking status: Never Smoker   . Smokeless tobacco: Not on file  . Alcohol Use: Not on file   OB History   Grav Para Term Preterm Abortions TAB SAB Ect Mult Living                 Review of Systems  Constitutional: Positive for fever.  HENT: Positive for congestion, sore throat and postnasal drip. Negative for rhinorrhea.   Respiratory: Positive for cough.   Cardiovascular: Negative.   Gastrointestinal: Positive for diarrhea. Negative for nausea and vomiting.  Genitourinary: Negative.   Musculoskeletal: Negative.   Skin: Negative.     Allergies  Review of patient's allergies indicates no known allergies.  Home Medications   Current Outpatient Rx  Name  Route  Sig  Dispense  Refill  . aspirin 325 MG tablet    Oral   Take 325 mg by mouth daily.         . chlorpheniramine-HYDROcodone (TUSSIONEX PENNKINETIC ER) 10-8 MG/5ML LQCR   Oral   Take 5 mLs by mouth every 12 (twelve) hours as needed.   115 mL   1   . clarithromycin (BIAXIN) 500 MG tablet   Oral   Take 1 tablet by mouth Twice daily.         Marland Kitchen esomeprazole (NEXIUM) 40 MG capsule   Oral   Take 40 mg by mouth daily before breakfast.         . fluticasone (FLONASE) 50 MCG/ACT nasal spray   Nasal   Place 2 sprays into the nose 2 (two) times daily.   1 g   2   . levothyroxine (SYNTHROID, LEVOTHROID) 25 MCG tablet   Oral   Take 1 tablet by mouth daily.         . Misc Natural Products (CALCIUM PYRUVATE) 750 MG CAPS   Oral   Take 1 capsule by mouth daily.         Marland Kitchen NASONEX 50 MCG/ACT nasal spray      As needed         . predniSONE (DELTASONE) 20 MG tablet   Oral   Take 3 tablets by mouth daily.         Marland Kitchen  PROAIR HFA 108 (90 BASE) MCG/ACT inhaler   Inhalation   Inhale 2 puffs into the lungs 4 (four) times daily as needed.         . Vitamin D, Ergocalciferol, (DRISDOL) 50000 UNITS CAPS   Oral   Take 1 capsule by mouth Once a week.          BP 114/80  Pulse 67  Temp(Src) 98 F (36.7 C) (Oral)  Resp 18  SpO2 99% Physical Exam  Nursing note and vitals reviewed. Constitutional: She is oriented to person, place, and time. She appears well-developed and well-nourished.  HENT:  Head: Normocephalic.  Right Ear: External ear normal.  Left Ear: External ear normal.  Nose: Nose normal.  Mouth/Throat: Oropharyngeal exudate present.  Eyes: Pupils are equal, round, and reactive to light.  Neck: Normal range of motion. Neck supple.  Cardiovascular: Normal rate and regular rhythm.   Pulmonary/Chest: Effort normal and breath sounds normal.  Abdominal: Soft. Bowel sounds are normal. She exhibits no distension and no mass. There is no tenderness. There is no rebound and no guarding.  Neurological: She is alert and  oriented to person, place, and time.  Skin: Skin is warm and dry.    ED Course  Procedures (including critical care time) Labs Review Labs Reviewed  POCT RAPID STREP A ([facility] CARE ONLY)   Imaging Review Dg Chest 2 View  08/19/2013   CLINICAL DATA:  Cough and fever.  EXAM: CHEST  2 VIEW  COMPARISON:  Single view of the chest 09/13/2010  FINDINGS: The heart size and mediastinal contours are within normal limits. Both lungs are clear. The visualized skeletal structures are unremarkable.  IMPRESSION: No active cardiopulmonary disease.   Electronically Signed   By: Drusilla Kanner M.D.   On: 08/19/2013 15:14    MDM  X-rays reviewed and report per radiologist.     Linna Hoff, MD 08/19/13 1530

## 2013-08-20 LAB — ECG 12-LEAD
Atrial Rate: 71 {beats}/min
P Axis: 54 degrees
P-R Interval: 204 ms
Q-T Interval: 376 ms
QRS Duration: 76 ms
QTC Calculation (Bezet): 408 ms
R Axis: 47 degrees
T Axis: 67 degrees
Ventricular Rate: 71 {beats}/min

## 2013-08-21 LAB — CULTURE, GROUP A STREP

## 2013-09-30 ENCOUNTER — Other Ambulatory Visit: Payer: Self-pay

## 2013-09-30 DIAGNOSIS — Z1231 Encounter for screening mammogram for malignant neoplasm of breast: Secondary | ICD-10-CM

## 2013-10-17 ENCOUNTER — Other Ambulatory Visit: Payer: Self-pay | Admitting: Internal Medicine

## 2013-10-17 ENCOUNTER — Ambulatory Visit
Admission: RE | Admit: 2013-10-17 | Discharge: 2013-10-17 | Disposition: A | Discharge status: Home / Self Care | Payer: Medicare Other | Source: Ambulatory Visit | Attending: Internal Medicine | Admitting: Internal Medicine

## 2013-10-17 DIAGNOSIS — M5432 Sciatica, left side: Secondary | ICD-10-CM

## 2013-11-01 ENCOUNTER — Ambulatory Visit: Payer: Medicare Other

## 2013-11-14 DIAGNOSIS — Z5189 Encounter for other specified aftercare: Secondary | ICD-10-CM

## 2013-11-14 HISTORY — DX: Encounter for other specified aftercare: Z51.89

## 2013-12-09 ENCOUNTER — Other Ambulatory Visit: Payer: Self-pay | Admitting: Nephrology

## 2013-12-09 DIAGNOSIS — C8590 Non-Hodgkin lymphoma, unspecified, unspecified site: Secondary | ICD-10-CM

## 2013-12-13 ENCOUNTER — Ambulatory Visit
Admission: RE | Admit: 2013-12-13 | Discharge: 2013-12-13 | Disposition: A | Discharge status: Home / Self Care | Payer: Medicare Other | Source: Ambulatory Visit | Attending: Hematology & Oncology | Admitting: Hematology & Oncology

## 2013-12-13 DIAGNOSIS — C8590 Non-Hodgkin lymphoma, unspecified, unspecified site: Secondary | ICD-10-CM

## 2013-12-13 DIAGNOSIS — C8589 Other specified types of non-Hodgkin lymphoma, extranodal and solid organ sites: Secondary | ICD-10-CM | POA: Insufficient documentation

## 2013-12-13 MED ORDER — TECHNETIUM TC 99M-LABELED RED BLOOD CELLS
27.0000 | Freq: Once | Status: AC | PRN
Start: 2013-12-13 — End: 2013-12-13
  Administered 2013-12-13: 27 via INTRAVENOUS
  Filled 2013-12-13: qty 30

## 2013-12-16 ENCOUNTER — Ambulatory Visit
Admission: RE | Admit: 2013-12-16 | Discharge: 2013-12-16 | Disposition: A | Discharge status: Home / Self Care | Payer: Medicare Other | Source: Ambulatory Visit | Attending: Body Imaging | Admitting: Body Imaging

## 2013-12-16 ENCOUNTER — Ambulatory Visit: Payer: Medicare Other | Admitting: Body Imaging

## 2013-12-16 ENCOUNTER — Encounter
Admission: RE | Disposition: A | Discharge status: Home / Self Care | Payer: Self-pay | Source: Ambulatory Visit | Attending: Body Imaging

## 2013-12-16 DIAGNOSIS — C8581 Other specified types of non-Hodgkin lymphoma, lymph nodes of head, face, and neck: Secondary | ICD-10-CM | POA: Insufficient documentation

## 2013-12-16 HISTORY — DX: Other seasonal allergic rhinitis: J30.2

## 2013-12-16 SURGERY — Surgical Case
Anesthesia: *Unknown

## 2013-12-16 SURGERY — MEDIPORT PLACEMENT
Anesthesia: Conscious Sedation

## 2013-12-16 MED ORDER — HEPARIN SOD (PORK) LOCK FLUSH 100 UNIT/ML IV SOLN
INTRAVENOUS | Status: AC
Start: 2013-12-16 — End: ?
  Filled 2013-12-16: qty 5

## 2013-12-16 MED ORDER — FENTANYL CITRATE 0.05 MG/ML IJ SOLN
INTRAMUSCULAR | Status: AC
Start: 2013-12-16 — End: ?
  Filled 2013-12-16: qty 6

## 2013-12-16 MED ORDER — MIDAZOLAM HCL 5 MG/5ML IJ SOLN
INTRAMUSCULAR | Status: AC | PRN
Start: 2013-12-16 — End: 2013-12-16
  Administered 2013-12-16: 1 mg via INTRAVENOUS

## 2013-12-16 MED ORDER — ONDANSETRON HCL 4 MG/2ML IJ SOLN
8.0000 mg | Freq: Once | INTRAMUSCULAR | Status: AC
Start: 2013-12-16 — End: 2013-12-16
  Administered 2013-12-16: 8 mg via INTRAVENOUS
  Filled 2013-12-16: qty 4

## 2013-12-16 MED ORDER — LIDOCAINE 1% BUFFERED - CNR/OUTSOURCED
INTRAMUSCULAR | Status: AC | PRN
Start: 2013-12-16 — End: 2013-12-16
  Administered 2013-12-16: 5 mL via INTRADERMAL

## 2013-12-16 MED ORDER — LIDOCAINE-EPINEPHRINE 1 %-1:100000 IJ SOLN
INTRAMUSCULAR | Status: AC | PRN
Start: 2013-12-16 — End: 2013-12-16
  Administered 2013-12-16: 8 mL

## 2013-12-16 MED ORDER — LIDOCAINE-EPINEPHRINE 1 %-1:100000 IJ SOLN
INTRAMUSCULAR | Status: AC
Start: 2013-12-16 — End: ?
  Filled 2013-12-16: qty 20

## 2013-12-16 MED ORDER — ONDANSETRON HCL 4 MG/2ML IJ SOLN
INTRAMUSCULAR | Status: AC
Start: 2013-12-16 — End: ?
  Filled 2013-12-16: qty 4

## 2013-12-16 MED ORDER — FENTANYL CITRATE 0.05 MG/ML IJ SOLN
INTRAMUSCULAR | Status: AC | PRN
Start: 2013-12-16 — End: 2013-12-16
  Administered 2013-12-16: 50 ug via INTRAVENOUS

## 2013-12-16 MED ORDER — MIDAZOLAM HCL 2 MG/2ML IJ SOLN
INTRAMUSCULAR | Status: AC
Start: 2013-12-16 — End: ?
  Filled 2013-12-16: qty 4

## 2013-12-16 MED ORDER — SODIUM CHLORIDE 0.9 % IV MBP
1.0000 g | Freq: Once | INTRAVENOUS | Status: AC
Start: 2013-12-16 — End: 2013-12-16
  Administered 2013-12-16: 1 g via INTRAVENOUS

## 2013-12-16 MED ORDER — LIDOCAINE 1% BUFFERED - CNR/OUTSOURCED
INTRAMUSCULAR | Status: AC
Start: 2013-12-16 — End: ?
  Filled 2013-12-16: qty 22

## 2013-12-16 MED ORDER — CEFAZOLIN SODIUM 1 G IJ SOLR
INTRAMUSCULAR | Status: AC
Start: 2013-12-16 — End: ?
  Filled 2013-12-16: qty 1000

## 2013-12-16 MED ORDER — SODIUM CHLORIDE 0.9 % IV BOLUS
INTRAVENOUS | Status: AC | PRN
Start: 2013-12-16 — End: 2013-12-16
  Administered 2013-12-16: 250 mL via INTRAVENOUS

## 2013-12-16 NOTE — Progress Notes (Signed)
Patient transported to post-procedure area in stable condition. VS are wnl. Patient denies pain/nausea/discomfort at this time. Dressing to RIGHT chest and RIGHT neck  is CDI and site is soft/nontender. Appears in NAD. Respirations are even and unlabored. Denies needs at this time.

## 2013-12-16 NOTE — Sedation Documentation (Signed)
Patient tolerated procedure well. VSS throughout. Patient denies pain/nausea/discomfort at this time. Appears in NAD. Respirations are even and unlabored. Patient transported to recovery area in stable condition and report given to Jane RN.

## 2013-12-16 NOTE — Progress Notes (Addendum)
0865: Receiving pt in recovery area. Pt easily awakened. Stating slight nausea. Does not want anything to drink at this time. Righrt chest and neck dressing CDI. Small dressing has been applied to both incisions. Discharge instructions given to pt and her husband.  7846: Pt requesting to go home.   0957: IV has been removed. Assisted to bathroom. Up, getting dressed. Pt discharged via WC.

## 2013-12-16 NOTE — H&P (Signed)
BRIEF IR H&P    Date Time: 12/16/2013 10:01 AM    PROCEDURALIST COMMENTS BELOW:         INDICATIONS:   Procedure(s):  MEDIPORT PLACEMENT      PAST MEDICAL HISTORY:     Past Medical History   Diagnosis Date   . Pancreatitis    . Back pain    . Seasonal allergic rhinitis    . Cancer 08/2012     Non-hodkins lymphoma/nodules on sleen       PAST SURGICAL HISTORY     Past Surgical History   Procedure Date   . Appendectomy      Age of 29   . Excision, squamous cell      Right hand/ 2000         REVIEW OF SYSTEMS REVIEWED:   YES  (x  )      CURRENT MEDICATION REVIEWED   YES  (x  )        ALLERGIES:     Allergies   Allergen Reactions   . Aspirin Other (See Comments)     Oral and stomach irritation if taken daily   . Codeine Nausea Only         PREVIOUS REACTION TO SEDATION MEDICATIONS     NO (x )   YES ( )      PHYSICAL EXAM     AIRWAY CLASSIFICATION:    CLASS I   (  )     CLASS II  (x  )    CLASS III  (  )     CLASS IV  (  )    INTUBATED (  )    CARDIAC :   (x  )  RRR  (  )  IRREG  (  )  MURMUR      LUNGS:   (x  )  CLEAR  (  )  DIMINISHED    (  ) LEFT   (  )  RIGHT  (  )  ABSENT          (  ) LEFT   (  )  RIGHT  (  )  TUBES            (  ) LEFT   (  )  RIGHT          ABDOMEN:   benign      NEURO:   [id number]      OTHER:        LABS:     Lab Results   Component Value Date/Time    WBC 6.39 08/19/2013  5:37 PM    WBC 6.86 06/01/2012  6:30 PM    HCT 38.9 08/19/2013  5:37 PM    BUN 9 08/19/2013  5:37 PM    CREAT 0.6 08/19/2013  5:37 PM    GLU 87 08/19/2013  5:37 PM    K 4.0 08/19/2013  5:37 PM             ASA PHYSICAL STATUS   (  )  ASA 1   HEALTHY PATIENT  (  )  ASA 2   MILD SYSTEMIC ILLNESS  (x  )  ASA 3   SYSTEMIC DISEASE, NOT INCAPACITATING  (  )  ASA 4   SEVERE SYSTEMIC DISEASE, DISEASE IS CONSTANT THREAT TO  LIFE  (  )  ASA 5   MORIBUND CONDITION, NOT EXPECTED TO LIVE >24 HOURS            IRRESPECTIVE OF PROCEDURE  (  )  E           EMERGENCY PROCEDURE       PLANNED SEDATION:   (  ) NO SEDATION  (x  ) MODERATE  SEDATION  (  ) DEEP SEDATION WITH ANESTHESIA      CONCLUSION:   PATIENT HAS BEEN REASSESSED IMMEDIATELY PRIOR TO THE PROCEDURE   AND IS AN APPROPRIATE CANDIDATE FOR THE PLANNED SEDATION AND   PROCEDURE.  RISKS, BENEFITS AND ALTERNATIVES TO THE PLANNED   PROCEDURE AND SEDATION HAVE BEEN EXPLAINED TO THE PATIENT   OR GUARDIAN.    (x  )  YES  (  )  EMERGENCY CONSENT         Signed by Eugenio Hoes., MD

## 2013-12-16 NOTE — Brief Op Note (Signed)
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  Ready for use.    Complications:   none      Signed by: Eugenio Hoes., MD                                                                              FO IVR

## 2013-12-16 NOTE — Sedation Documentation (Signed)
Vaccess CT Power-Injectable Implantable Port successfully placed in RIGHT chest.     REF:[id number]  LOT:[id number]

## 2013-12-16 NOTE — Sedation Documentation (Signed)
Patient moved self from stretcher to procedure table without difficulty. Patient positioned for maximum safety and comfort. Arm boards applied and bony prominences padded. Connected to VS monitor. Patient denies pain/discomfort at this time. Appears in NAD.

## 2013-12-16 NOTE — Sedation Documentation (Signed)
Patient being prepped and draped at this time.

## 2013-12-23 ENCOUNTER — Emergency Department
Admission: EM | Admit: 2013-12-23 | Discharge: 2013-12-23 | Disposition: A | Discharge status: Home / Self Care | Payer: Medicare Other | Attending: Emergency Medical Services | Admitting: Emergency Medical Services

## 2013-12-23 ENCOUNTER — Emergency Department: Payer: Medicare Other

## 2013-12-23 ENCOUNTER — Other Ambulatory Visit: Payer: Self-pay

## 2013-12-23 DIAGNOSIS — C8589 Other specified types of non-Hodgkin lymphoma, extranodal and solid organ sites: Secondary | ICD-10-CM | POA: Insufficient documentation

## 2013-12-23 DIAGNOSIS — R079 Chest pain, unspecified: Secondary | ICD-10-CM | POA: Insufficient documentation

## 2013-12-23 DIAGNOSIS — R519 Headache, unspecified: Secondary | ICD-10-CM

## 2013-12-23 DIAGNOSIS — C8590 Non-Hodgkin lymphoma, unspecified, unspecified site: Secondary | ICD-10-CM

## 2013-12-23 DIAGNOSIS — R51 Headache: Secondary | ICD-10-CM | POA: Insufficient documentation

## 2013-12-23 LAB — MAN DIFF ONLY
Band Neutrophils Absolute: 0.42 10*3/uL (ref 0.00–1.00)
Band Neutrophils: 9 %
Basophils Absolute Manual: 0 10*3/uL (ref 0.00–0.20)
Basophils Manual: 0 %
Eosinophils Absolute Manual: 0.42 10*3/uL (ref 0.00–0.70)
Eosinophils Manual: 9 %
Lymphocytes Absolute Manual: 0.97 10*3/uL (ref 0.50–4.40)
Lymphocytes Manual: 21 %
Metamyelocytes Absolute: 0.09 10*3/uL — ABNORMAL HIGH
Metamyelocytes: 2 %
Monocytes Absolute: 0 10*3/uL (ref 0.00–1.20)
Monocytes Manual: 0 %
Neutrophils Absolute Manual: 2.73 10*3/uL (ref 1.80–8.10)
Nucleated RBC: 0 (ref 0–1)
Segmented Neutrophils: 59 %

## 2013-12-23 LAB — COMPREHENSIVE METABOLIC PANEL
ALT: 14 U/L (ref 0–55)
AST (SGOT): 17 U/L (ref 5–34)
Albumin/Globulin Ratio: 1.5 (ref 0.9–2.2)
Albumin: 3.9 g/dL (ref 3.5–5.0)
Alkaline Phosphatase: 100 U/L (ref 40–150)
Anion Gap: 10 (ref 5.0–15.0)
BUN: 16 mg/dL (ref 7–19)
Bilirubin, Total: 0.8 mg/dL (ref 0.2–1.2)
CO2: 25 mEq/L (ref 22–29)
Calcium: 9.1 mg/dL (ref 7.9–10.6)
Chloride: 101 mEq/L (ref 98–107)
Creatinine: 0.6 mg/dL (ref 0.6–1.0)
Globulin: 2.6 g/dL (ref 2.0–3.6)
Glucose: 97 mg/dL (ref 70–100)
Potassium: 3.6 mEq/L (ref 3.5–5.1)
Protein, Total: 6.5 g/dL (ref 6.0–8.3)
Sodium: 136 mEq/L (ref 136–145)

## 2013-12-23 LAB — CBC AND DIFFERENTIAL
Hematocrit: 38.5 % (ref 37.0–47.0)
Hgb: 13.5 g/dL (ref 12.0–16.0)
MCH: 30.1 pg (ref 28.0–32.0)
MCHC: 35.1 g/dL (ref 32.0–36.0)
MCV: 85.7 fL (ref 80.0–100.0)
MPV: 10.9 fL (ref 9.4–12.3)
Platelets: 97 10*3/uL — ABNORMAL LOW (ref 140–400)
RBC: 4.49 10*6/uL (ref 4.20–5.40)
RDW: 12 % (ref 12–15)
WBC: 4.62 10*3/uL (ref 3.50–10.80)

## 2013-12-23 LAB — CELL MORPHOLOGY
Cell Morphology: NORMAL
Platelet Estimate: DECREASED — AB

## 2013-12-23 LAB — GFR: EGFR: >60

## 2013-12-23 LAB — CK: Creatine Kinase (CK): 20 U/L — ABNORMAL LOW (ref 29–168)

## 2013-12-23 LAB — TROPONIN I: Troponin I: 0.01 ng/mL (ref 0.00–0.09)

## 2013-12-23 MED ORDER — SODIUM CHLORIDE 0.9 % IV BOLUS
1000.0000 mL | Freq: Once | INTRAVENOUS | Status: AC
Start: 2013-12-23 — End: 2013-12-23
  Administered 2013-12-23: 1000 mL via INTRAVENOUS

## 2013-12-23 MED ORDER — HYDROMORPHONE HCL PF 1 MG/ML IJ SOLN
0.2500 mg | Freq: Once | INTRAMUSCULAR | Status: AC
Start: 2013-12-23 — End: 2013-12-23
  Administered 2013-12-23: 0.25 mg via INTRAVENOUS
  Filled 2013-12-23: qty 1

## 2013-12-23 MED ORDER — HYDROMORPHONE HCL 2 MG PO TABS
2.0000 mg | ORAL_TABLET | Freq: Four times a day (QID) | ORAL | 0 refills | Status: DC | PRN
Start: 2013-12-23 — End: 2014-03-05
  Filled 2013-12-23: qty 15, 4d supply, fill #0

## 2013-12-23 MED ORDER — ONDANSETRON HCL 4 MG/2ML IJ SOLN
4.0000 mg | Freq: Once | INTRAMUSCULAR | Status: AC
Start: 2013-12-23 — End: 2013-12-23
  Administered 2013-12-23: 4 mg via INTRAVENOUS
  Filled 2013-12-23: qty 2

## 2013-12-23 NOTE — Discharge Instructions (Signed)
Dear Maureen Vasquez:    I appreciate your choosing the [facility] for your healthcare needs, and hope your visit today was EXCELLENT.    Instructions:  Please follow-up with Dr. Melvyn Neth in 3-4 days.    Make sure to have your blood pressure rechecked this week as well.    Return to the Emergency Department for any worsening symptoms or concerns.    Below is some information that our patients often find helpful.    We wish you good health and please do not hesitate to contact us if we can ever be of any assistance.    Sincerely,  Pamala Hurry, MD  [facility]    ________________________________________________________________    If you do not continue to improve or your condition worsens, please contact your doctor or return immediately to the Emergency Department.    Thank you for choosing [facility] for your emergency care needs.  We strive to provide EXCELLENT care to you and your family.      DOCTOR REFERRALS  Call [phone] if you need any further referrals and we can help you find a primary care doctor or specialist.  Also, available online at:  [email or web address]    YOUR CONTACT INFORMATION  Before leaving please check with registration to make sure we have an up-to-date contact number.  You can call registration at [phone] to update your information.  For questions about your hospital bill, please call [phone].  For questions about your Emergency Dept Physician bill please call [phone].      FREE HEALTH SERVICES  If you need help with health or social services, please call 2-1-1 for a free referral to resources in your area.  2-1-1 is a free service connecting people with information on health insurance, free clinics, pregnancy, mental health, dental care, food assistance, housing, and substance abuse counseling.  Also, available online at:  http://[email or web address].org    MEDICAL RECORDS AND  TESTS  Certain laboratory test results do not come back the same day, for example urine cultures.   We will contact you if other important findings are noted.  Radiology films are often reviewed again to ensure accuracy.  If there is any discrepancy, we will notify you.      Please call [phone] to pick up a complimentary CD of any radiology studies performed.  If you or your doctor would like to request a copy of your medical records, please call [phone].      ORTHOPEDIC INJURY   Please know that significant injuries can exist even when an initial x-ray is read as normal or negative.  This can occur because some fractures (broken bones) are not initially visible on x-rays.  For this reason, close outpatient follow-up with your primary care doctor or bone specialist (orthopedist) is required.    MEDICATIONS AND FOLLOWUP  Please be aware that some prescription medications can cause drowsiness.  Use caution when driving or operating machinery.    The examination and treatment you have received in our Emergency Department is provided on an emergency basis, and is not intended to be a substitute for your primary care physician.  It is important that your doctor checks you again and that you report any new or remaining problems at that time.      24 HOUR PHARMACIES  [facility] - [address] [place], [place] [address] (1.4 miles, 7 minutes)  [facility] - [address]  [address], [place], [place] [address] (6.5 miles, 13 minutes)  Handout with directions available on request

## 2013-12-23 NOTE — ED Provider Notes (Signed)
Physician/Midlevel provider first contact with patient: 12/23/13 1711         [facility] EMERGENCY DEPARTMENT HISTORY AND PHYSICAL EXAM    Patient Name: Maureen Vasquez, Maureen Vasquez  Encounter Date:  12/23/2013  Rendering Provider: Pete Glatter, MD  Patient DOB:  July 12, 1934  MRN:  [id number]    History of Presenting Illness     Historian: Pt    The patient Maureen Vasquez is a 80 y.o. female with h/o Non-Hodgkin's lymphoma, pancreatitis, and appendectomy p/w a gradual onset of persistent, throbbing, diffuse HA (L>R) since last PM, slightly improved today. Associated with CP. Pt was unable to sleep secondary to pain. She also reports that she took her BP at home and found it to be 157/89. She had her first chemotherapy treatment 5 days ago and has not been feeling well since then. No relief with Tylenol.    PMD:  Mack Hook, MD    Past Medical History     Past Medical History   Diagnosis Date   . Pancreatitis    . Back pain    . Seasonal allergic rhinitis    . Cancer 08/2012     Non-hodkins lymphoma/nodules on sleen       Past Surgical History     Past Surgical History   Procedure Date   . Appendectomy      Age of 38   . Excision, squamous cell      Right hand/ 2000   . Placement, mediport        Family History     Family History   Problem Relation Age of Onset   . Heart attack Mother    . Heart disease Mother    . Parkinsonism Mother    . Hyperlipidemia Mother    . Heart disease Sister    . Heart disease Brother        Social History     History     Social History   . Marital Status: Married     Spouse Name: N/A     Number of Children: N/A   . Years of Education: N/A     Social History Main Topics   . Smoking status: Never Smoker    . Smokeless tobacco: Not on file   . Alcohol Use: 4.2 oz/week     7 Glasses of wine per week   . Drug Use: No   . Sexually Active: Not on file     Other Topics Concern   . Not on file     Social History Narrative   . No narrative on file       Home Medications     Home medications reviewed by  ED MD at 5:11 PM     Previous Medications    ACETAMINOPHEN (TYLENOL) 325 MG TABLET    Take 650 mg by mouth every 6 (six) hours as needed. Takes 1/2 tab at a time    ALOE VERA JUICE PO    Take by mouth.    CHOLECALCIFEROL (VITAMIN D-3 PO)    Take by mouth.      CYANOCOBALAMIN (VITAMIN B-12 PO)    Take by mouth.      HYDROCODONE-ACETAMINOPHEN (NORCO) 5-325 MG PER TABLET    Take 1 tablet by mouth every 6 (six) hours as needed for Pain.    HYDROCODONE-ACETAMINOPHEN (VICODIN) 5-300 MG TABS    Take 1-2 tablets by mouth every 6 (six) hours as needed.    MAGNESIUM PO  Take by mouth.      ONDANSETRON (ZOFRAN ODT) 4 MG DISINTEGRATING TABLET    Take 1 tablet (4 mg total) by mouth every 6 (six) hours as needed for Nausea.    PSEUDOEPHEDRINE HCL (SUDAFED PO)    Take by mouth as needed.         Review of Systems     Constitutional:  No fever  Eyes: No discharge   ENT: No ST  CV:  +CP, +HTN  Resp:  No SOB or cough  GI: No abd pain, N, V, D  GU: No dysuria  MS:    Skin: No rash  Neuro:  +HA  Psych:  No behavior changes  All other systems reviewed and negative    Physical Exam     BP 181/93  Pulse 78  Temp 97.5 F (36.4 C)  Resp 18  Ht 1.626 m  Wt 58.514 kg  BMI 22.13 kg/m2  SpO2 97%    CONSTITUTIONAL   Patient is afebrile, Vital Signs Reviewed, Well appearing, Patient appears comfortable, Alert.  HEAD   Atraumatic, Normocephalic.  EYES   Eyes are normal to inspection, No discharge from eyes, Sclera are normal.  ENT   Mouth normal to inspection. Moist oral mucosa.  NECK   Normal ROM, No jugular venous distention, Normal inspection.  RESPIRATORY CHEST   Mediport in R anterior chest with some bruising surrounding the area consistent with post-operative timing. Breath sounds normal, No respiratory distress.   CARDIOVASCULAR   RRR, Heart sounds normal, Normal S1 S2.  ABDOMEN   Abdomen is nontender. No distension, No peritoneal signs.  UPPER EXTREMITY   Inspection normal, No cyanosis, No clubbing, No edema.  NEURO   GCS is 15,  No focal motor deficits. Speech is normal, appropriate  SKIN   Skin is warm, Skin is dry, Skin is normal color.  PSYCHIATRIC   Oriented X 3, Normal affect.    ED Medications Administered     ED Medication Orders      Start     Status Ordering Provider    12/23/13 1809   sodium chloride 0.9 % bolus 1,000 mL   Once      Route: Intravenous  Ordered Dose: 1,000 mL         Last MAR action:  Stopped Krisanne Lich S    12/23/13 1809   ondansetron (ZOFRAN) injection 4 mg   Once      Route: Intravenous  Ordered Dose: 4 mg         Last MAR action:  Given Jarely Juncaj S    12/23/13 1809   HYDROmorphone (DILAUDID) injection 0.25 mg   Once      Route: Intravenous  Ordered Dose: 0.25 mg         Last MAR action:  Given Jayon Matton S                Orders Placed During This Encounter     Orders Placed This Encounter   Procedures   . CT Head without Contrast   . CBC with Differential   . Comprehensive Metabolic Panel (CMP)   . GFR   . Manual Differential   . Cell MorpHology   . Creatine Kinase (CK)   . Troponin I   . ECG 12 Lead       Diagnostic Study Results     The results of the diagnostic studies below were reviewed by the ED provider:    Labs  Results  Procedure Component Value Units Date/Time    Troponin I [[id number]] Collected:12/23/13 1813     Troponin I 0.01 ng/mL Updated:12/23/13 1950    Creatine Kinase (CK) [[id number]]  (Abnormal) Collected:12/23/13 1813     Creatine Kinase (CK) 20 (L) U/L Updated:12/23/13 1943    Cell MorpHology [[id number]]  (Abnormal) Collected:12/23/13 1813     Cell Morphology: Normal Updated:12/23/13 1850     Platelet Estimate Decreased (A)     Manual Differential [[id number]]  (Abnormal) Collected:12/23/13 1813     Segmented Neutrophils 59 % Updated:12/23/13 1850     Band Neutrophils 9 %      Lymphocytes Manual 21 %      Monocytes Manual 0 %      Eosinophils Manual 9 %      Basophils Manual 0 %      Metamyelocytes 2 %      Nucleated RBC 0      Abs Seg Manual 2.73 x10 3/uL      Bands  Absolute 0.42 x10 3/uL      Absolute Lymph Manual 0.97 x10 3/uL      Monocytes Absolute 0.00 x10 3/uL      Absolute Eos Manual 0.42 x10 3/uL      Absolute Baso Manual 0.00 x10 3/uL      Metamyelocytes Absolute 0.09 (H) x10 3/uL     CBC with Differential [[id number]]  (Abnormal) Collected:12/23/13 1813    Specimen Information:Blood / Blood Updated:12/23/13 1850     WBC 4.62 x10 3/uL      RBC 4.49 x10 6/uL      Hgb 13.5 g/dL      Hematocrit 24.4 %      MCV 85.7 fL      MCH 30.1 pg      MCHC 35.1 g/dL      RDW 12 %      Platelets 97 (L) x10 3/uL      MPV 10.9 fL     Comprehensive Metabolic Panel (CMP) [[id number]] Collected:12/23/13 1813    Specimen Information:Blood Updated:12/23/13 1833     Glucose 97 mg/dL      BUN 16 mg/dL      Creatinine 0.6 mg/dL      Sodium 644 mEq/L      Potassium 3.6 mEq/L      Chloride 101 mEq/L      CO2 25 mEq/L      CALCIUM 9.1 mg/dL      Protein, Total 6.5 g/dL      Albumin 3.9 g/dL      AST (SGOT) 17 U/L      ALT 14 U/L      Alkaline Phosphatase 100 U/L      Bilirubin, Total 0.8 mg/dL      Globulin 2.6 g/dL      Albumin/Globulin Ratio 1.5      Anion Gap 10.0     GFR [[id number]] Collected:12/23/13 1813     EGFR >60.0 Updated:12/23/13 1833          Radiologic Studies  Radiology Results (24 Hour)     Procedure Component Value Units Date/Time    CT Head without Contrast [[id number]] Collected:12/23/13 1733    Order Status:Completed  Updated:12/23/13 1738    Narrative:    Clinical history: Headache. History of lymphoma.    Comments: Unenhanced CT scan of the brain was performed.    The ventricles are normal in size and configuration. Basilar cisterns  are patent. There is no acute intracranial hemorrhage or mass effect.  There is minimal chronic small vessel ischemic change in the  periventricular white matter. The calvarium appears intact. The  visualized mastoid air cells are clear. The visualized upper paranasal  sinuses are clear except for mild mucosal changes in the posterior  ethmoids.       Impression:     No acute intracranial hemorrhage or mass effect. See above.    Melody Haver, MD   12/23/2013 5:34 PM          Scribe and MD Attestations     I, Pete Glatter, MD, personally performed the services documented. Crisoforo Oxford is scribing for me on [facility]. I reviewed and confirm the accuracy of the information in this medical record.    I, Crisoforo Oxford, am serving as a Neurosurgeon to document services personally performed by Pete Glatter, MD, based on the provider's statements to me.     Rendering Provider: Pete Glatter, MD    Monitors, EKG, Critical Care, and Splints     EKG (interpreted by ED physician): NSR at 81 bpm. Normal EKG.  Cardiac Monitor (interpreted by ED physician):     Critical Care:   Splint check:      MDM and Clinical Notes     Notes:    Consults:    Diagnosis and Disposition     Clinical Impression  1. Headache    2. Chest pain    3. Non Hodgkin's lymphoma        Disposition  ED Disposition     Discharge JAYLEI FUERTE discharge to home/self care.    Condition at disposition: Stable            Prescriptions     New Prescriptions    HYDROMORPHONE (DILAUDID) 2 MG TABLET    Take 1 tablet (2 mg total) by mouth every 6 (six) hours as needed for Pain (As needed for pain).               Pamala Hurry, MD  12/24/13 [phone]

## 2013-12-23 NOTE — ED Notes (Signed)
80 yo female last chemo 12/18/13-- HA off and on --worst HA was last night. BP as 157/89 at home with a HR of 122. Pt is alert and fluent--able to walk with steady gait-- feels pulsing in head, eyes , and temples-- rates discomfort 10/10. Mild nausea only. No BM 3 days without a BM. Decrease appetite today.

## 2013-12-25 LAB — ECG 12-LEAD
Atrial Rate: 81 {beats}/min
P Axis: 79 degrees
P-R Interval: 170 ms
Q-T Interval: 360 ms
QRS Duration: 74 ms
QTC Calculation (Bezet): 418 ms
R Axis: 49 degrees
T Axis: 70 degrees
Ventricular Rate: 81 {beats}/min

## 2013-12-26 ENCOUNTER — Emergency Department (INDEPENDENT_AMBULATORY_CARE_PROVIDER_SITE_OTHER)
Admission: EM | Admit: 2013-12-26 | Discharge: 2013-12-26 | Disposition: A | Discharge status: Home / Self Care | Payer: Medicare Other | Source: Home / Self Care | Attending: Family Medicine | Admitting: Family Medicine

## 2013-12-26 ENCOUNTER — Emergency Department (INDEPENDENT_AMBULATORY_CARE_PROVIDER_SITE_OTHER): Payer: Medicare Other

## 2013-12-26 ENCOUNTER — Encounter (HOSPITAL_COMMUNITY): Payer: Self-pay | Admitting: Emergency Medicine

## 2013-12-26 ENCOUNTER — Emergency Department (HOSPITAL_COMMUNITY): Payer: Medicare Other

## 2013-12-26 ENCOUNTER — Emergency Department (HOSPITAL_COMMUNITY)
Admission: EM | Admit: 2013-12-26 | Discharge: 2013-12-26 | Disposition: A | Discharge status: Home / Self Care | Payer: Medicare Other | Attending: Emergency Medicine | Admitting: Emergency Medicine

## 2013-12-26 DIAGNOSIS — Z7982 Long term (current) use of aspirin: Secondary | ICD-10-CM | POA: Insufficient documentation

## 2013-12-26 DIAGNOSIS — R630 Anorexia: Secondary | ICD-10-CM | POA: Insufficient documentation

## 2013-12-26 DIAGNOSIS — R0789 Other chest pain: Secondary | ICD-10-CM | POA: Insufficient documentation

## 2013-12-26 DIAGNOSIS — Z8669 Personal history of other diseases of the nervous system and sense organs: Secondary | ICD-10-CM | POA: Insufficient documentation

## 2013-12-26 DIAGNOSIS — Z8709 Personal history of other diseases of the respiratory system: Secondary | ICD-10-CM | POA: Insufficient documentation

## 2013-12-26 DIAGNOSIS — R079 Chest pain, unspecified: Secondary | ICD-10-CM

## 2013-12-26 DIAGNOSIS — K219 Gastro-esophageal reflux disease without esophagitis: Secondary | ICD-10-CM | POA: Insufficient documentation

## 2013-12-26 DIAGNOSIS — IMO0002 Reserved for concepts with insufficient information to code with codable children: Secondary | ICD-10-CM | POA: Insufficient documentation

## 2013-12-26 DIAGNOSIS — Z792 Long term (current) use of antibiotics: Secondary | ICD-10-CM | POA: Insufficient documentation

## 2013-12-26 DIAGNOSIS — G479 Sleep disorder, unspecified: Secondary | ICD-10-CM | POA: Insufficient documentation

## 2013-12-26 DIAGNOSIS — E860 Dehydration: Secondary | ICD-10-CM | POA: Insufficient documentation

## 2013-12-26 DIAGNOSIS — Z79899 Other long term (current) drug therapy: Secondary | ICD-10-CM | POA: Insufficient documentation

## 2013-12-26 LAB — BASIC METABOLIC PANEL
BUN: 18 mg/dL (ref 6–23)
CHLORIDE: 102 meq/L (ref 96–112)
CO2: 26 mEq/L (ref 19–32)
CREATININE: 1.53 mg/dL — AB (ref 0.50–1.10)
Calcium: 9.6 mg/dL (ref 8.4–10.5)
GFR, EST AFRICAN AMERICAN: 36 mL/min — AB (ref >90)
GFR, EST NON AFRICAN AMERICAN: 31 mL/min — AB (ref >90)
Glucose, Bld: 105 mg/dL — ABNORMAL HIGH (ref 70–99)
Potassium: 4.6 mEq/L (ref 3.7–5.3)
Sodium: 142 mEq/L (ref 137–147)

## 2013-12-26 LAB — CBC WITH DIFFERENTIAL/PLATELET
BASOS ABS: 0 10*3/uL (ref 0.0–0.1)
BASOS PCT: 0 % (ref 0–1)
EOS ABS: 0.1 10*3/uL (ref 0.0–0.7)
Eosinophils Relative: 1 % (ref 0–5)
HEMATOCRIT: 37.3 % (ref 36.0–46.0)
Hemoglobin: 12.8 g/dL (ref 12.0–15.0)
Lymphocytes Relative: 32 % (ref 12–46)
Lymphs Abs: 2.4 10*3/uL (ref 0.7–4.0)
MCH: 33.1 pg (ref 26.0–34.0)
MCHC: 34.3 g/dL (ref 30.0–36.0)
MCV: 96.4 fL (ref 78.0–100.0)
MONOS PCT: 10 % (ref 3–12)
Monocytes Absolute: 0.7 10*3/uL (ref 0.1–1.0)
NEUTROS ABS: 4.2 10*3/uL (ref 1.7–7.7)
Neutrophils Relative %: 57 % (ref 43–77)
Platelets: 221 10*3/uL (ref 150–400)
RBC: 3.87 MIL/uL (ref 3.87–5.11)
RDW: 13 % (ref 11.5–15.5)
WBC: 7.4 10*3/uL (ref 4.0–10.5)

## 2013-12-26 LAB — TROPONIN I

## 2013-12-26 MED ORDER — NITROGLYCERIN 0.4 MG SL SUBL
SUBLINGUAL_TABLET | SUBLINGUAL | Status: AC
  Filled 2013-12-26: qty 25

## 2013-12-26 MED ORDER — NITROGLYCERIN 0.4 MG SL SUBL
0.4000 mg | SUBLINGUAL_TABLET | SUBLINGUAL | Status: DC | PRN
  Administered 2013-12-26: 0.4 mg via SUBLINGUAL

## 2013-12-26 MED ORDER — ASPIRIN 81 MG PO CHEW
324.0000 mg | CHEWABLE_TABLET | Freq: Once | ORAL | Status: AC
  Administered 2013-12-26: 324 mg via ORAL

## 2013-12-26 MED ORDER — ASPIRIN 81 MG PO CHEW
CHEWABLE_TABLET | ORAL | Status: AC
  Filled 2013-12-26: qty 4

## 2013-12-26 MED ORDER — SODIUM CHLORIDE 0.9 % IV BOLUS (SEPSIS)
1000.0000 mL | Freq: Once | INTRAVENOUS | Status: AC
  Administered 2013-12-26: 1000 mL via INTRAVENOUS

## 2013-12-26 MED ORDER — SODIUM CHLORIDE 0.9 % IV SOLN
Freq: Once | INTRAVENOUS | Status: AC
  Administered 2013-12-26: 16:00:00 via INTRAVENOUS

## 2013-12-26 NOTE — ED Provider Notes (Signed)
Nicole [name] is a 80 y.o. female who presents to Urgent Care today for chest pain. Patient developed chest pain yesterday evening. She describes the pain is moderate central and possibly exertional. She notes sometimes is also worse with cough. She also notes a mild nonproductive cough sore throat and headache. In the week she had dry mouth and hoarse voice as well. She's tried Tylenol which has helped a little. She denies any palpitations or syncope. She does note fatigue. She is not eating or sleeping well. She denies any history of coronary artery disease. She denies any significant leg swelling or significant shortness of breath.   Past Medical History  Diagnosis Date  . Essential and other specified forms of tremor   . Esophageal reflux   . Insomnia, unspecified   . Allergic rhinitis, cause unspecified   . Acute bronchitis    History  Substance Use Topics  . Smoking status: Never Smoker   . Smokeless tobacco: Not on file  . Alcohol Use: No   ROS as above Medications: Current Facility-Administered Medications  Medication Dose Route Frequency Provider Last Rate Last Dose  . 0.9 %  sodium chloride infusion   Intravenous Once Rodolph BongEvan S Leston Schueller, MD      . aspirin chewable tablet 324 mg  324 mg Oral Once Rodolph BongEvan S Jolyn Deshmukh, MD      . nitroGLYCERIN (NITROSTAT) SL tablet 0.4 mg  0.4 mg Sublingual Q5 min PRN Rodolph BongEvan S Venus Gilles, MD       Current Outpatient Prescriptions  Medication Sig Dispense Refill  . aspirin 325 MG tablet Take 325 mg by mouth daily.      . chlorpheniramine-HYDROcodone (TUSSIONEX PENNKINETIC ER) 10-8 MG/5ML LQCR Take 5 mLs by mouth every 12 (twelve) hours as needed.  115 mL  1  . clarithromycin (BIAXIN) 500 MG tablet Take 1 tablet by mouth Twice daily.      Marland Kitchen. esomeprazole (NEXIUM) 40 MG capsule Take 40 mg by mouth daily before breakfast.      . fluticasone (FLONASE) 50 MCG/ACT nasal spray Place 2 sprays into the nose 2 (two) times daily.  1 g  2  . levothyroxine (SYNTHROID,  LEVOTHROID) 25 MCG tablet Take 1 tablet by mouth daily.      . Misc Natural Products (CALCIUM PYRUVATE) 750 MG CAPS Take 1 capsule by mouth daily.      Marland Kitchen. NASONEX 50 MCG/ACT nasal spray As needed      . predniSONE (DELTASONE) 20 MG tablet Take 3 tablets by mouth daily.      Marland Kitchen. PROAIR HFA 108 (90 BASE) MCG/ACT inhaler Inhale 2 puffs into the lungs 4 (four) times daily as needed.      . Vitamin D, Ergocalciferol, (DRISDOL) 50000 UNITS CAPS Take 1 capsule by mouth Once a week.        Exam:  BP 104/62  Pulse 75  Temp(Src) 98.9 F (37.2 C) (Oral)  Resp 16  SpO2 96% Gen: Well NAD HEENT: EOMI,  MMM posterior pharynx mildly erythematous. Lungs: Normal work of breathing. CTABL Heart: RRR no MRG Abd: NABS, Soft. NT, ND Exts: Brisk capillary refill, warm and well perfused. Nonedematous  Twelve-lead EKG shows normal sinus rhythm at 63 beats per minute. No significant ST elevation or depression. Possible old inferior infarct  No results found for this or any previous visit (from the past 24 hour(s)). Dg Chest 2 View  12/26/2013   CLINICAL DATA:  Chest congestion and sore throat.  EXAM: CHEST  2 VIEW  COMPARISON:  PA and lateral chest 08/19/2013.  FINDINGS: Lungs are clear. Heart size is normal. No pneumothorax or pleural effusion.  IMPRESSION: No acute disease.   Electronically Signed   By: Drusilla Kanner M.D.   On: 12/26/2013 15:46    Assessment and Plan: 80 y.o. female with central chest pain with slightly abnormal EKG and normal chest x-ray. This is concerning for pulmonary embolism or NSTEMI. Additionally patient may have pleuritis. Plan to treat as though acute coronary syndrome given that she continues to have symptoms. We'll provide aspirin nitroglycerin oxygen IVs and transfer to ER via EMS. Patient would benefit from troponin and possible d-dimer.  Discussed warning signs or symptoms. Please see discharge instructions. Patient expresses understanding.    Rodolph Bong, MD 12/26/13  [id number]

## 2013-12-26 NOTE — ED Notes (Signed)
Patient reports she thinks she had a cold that has moved into her chest.  Reports headache, sore throat, hoarseness, chest soreness, minimal cough and minimal phlegm.

## 2013-12-26 NOTE — ED Notes (Signed)
Notified [facility] 

## 2013-12-26 NOTE — ED Notes (Signed)
[facility] episode of midsternal non-radiating chest pain. Pt reports constant CP since yesterday Pt denies any associated symptoms such as SOB, N/V, diaphoresis, or lightheadedness. Pt reports she has had a cold and allergies for the past several days and that she has had generalized weakness, decreased PO intake, and difficulty sleeping. Pt also reports a mild non-productive cough, sore throat, and HA. Pt NSR on monitor at this time. Pt received ASA and nitro. Pt denies any CP at this time. Pt A&Ox4, resp e/u, and skin warm and dry.

## 2013-12-26 NOTE — Discharge Instructions (Signed)
Chest Pain (Nonspecific) °It is often hard to give a specific diagnosis for the cause of chest pain. There is always a chance that your pain could be related to something serious, such as a heart attack or a blood clot in the lungs. You need to follow up with your caregiver for further evaluation. °CAUSES  °· Heartburn. °· Pneumonia or bronchitis. °· Anxiety or stress. °· Inflammation around your heart (pericarditis) or lung (pleuritis or pleurisy). °· A blood clot in the lung. °· A collapsed lung (pneumothorax). It can develop suddenly on its own (spontaneous pneumothorax) or from injury (trauma) to the chest. °· Shingles infection (herpes zoster virus). °The chest wall is composed of bones, muscles, and cartilage. Any of these can be the source of the pain. °· The bones can be bruised by injury. °· The muscles or cartilage can be strained by coughing or overwork. °· The cartilage can be affected by inflammation and become sore (costochondritis). °DIAGNOSIS  °Lab tests or other studies, such as X-rays, electrocardiography, stress testing, or cardiac imaging, may be needed to find the cause of your pain.  °TREATMENT  °· Treatment depends on what may be causing your chest pain. Treatment may include: °· Acid blockers for heartburn. °· Anti-inflammatory medicine. °· Pain medicine for inflammatory conditions. °· Antibiotics if an infection is present. °· You may be advised to change lifestyle habits. This includes stopping smoking and avoiding alcohol, caffeine, and chocolate. °· You may be advised to keep your head raised (elevated) when sleeping. This reduces the chance of acid going backward from your stomach into your esophagus. °· Most of the time, nonspecific chest pain will improve within 2 to 3 days with rest and mild pain medicine. °HOME CARE INSTRUCTIONS  °· If antibiotics were prescribed, take your antibiotics as directed. Finish them even if you start to feel better. °· For the next few days, avoid physical  activities that bring on chest pain. Continue physical activities as directed. °· Do not smoke. °· Avoid drinking alcohol. °· Only take over-the-counter or prescription medicine for pain, discomfort, or fever as directed by your caregiver. °· Follow your caregiver's suggestions for further testing if your chest pain does not go away. °· Keep any follow-up appointments you made. If you do not go to an appointment, you could develop lasting (chronic) problems with pain. If there is any problem keeping an appointment, you must call to reschedule. °SEEK MEDICAL CARE IF:  °· You think you are having problems from the medicine you are taking. Read your medicine instructions carefully. °· Your chest pain does not go away, even after treatment. °· You develop a rash with blisters on your chest. °SEEK IMMEDIATE MEDICAL CARE IF:  °· You have increased chest pain or pain that spreads to your arm, neck, jaw, back, or abdomen. °· You develop shortness of breath, an increasing cough, or you are coughing up blood. °· You have severe back or abdominal pain, feel nauseous, or vomit. °· You develop severe weakness, fainting, or chills. °· You have a fever. °THIS IS AN EMERGENCY. Do not wait to see if the pain will go away. Get medical help at once. Call your local emergency services (911 in [place] Do not drive yourself to the hospital. °MAKE SURE YOU:  °· Understand these instructions. °· Will watch your condition. °· Will get help right away if you are not doing well or get worse. °Document Released: 08/10/2005 Document Revised: 01/23/2012 Document Reviewed: 06/05/2008 °ExitCare® Patient Information ©2014 [employer]   LL[employer].  Dehydration, Adult Dehydration is when you lose more fluids from the body than you take in. Vital organs like the kidneys, brain, and heart cannot function without a proper amount of fluids and salt. Any loss of fluids from the body can cause dehydration.  CAUSES   Vomiting.  Diarrhea.  Excessive  sweating.  Excessive urine output.  Fever. SYMPTOMS  Mild dehydration  Thirst.  Dry lips.  Slightly dry mouth. Moderate dehydration  Very dry mouth.  Sunken eyes.  Skin does not bounce back quickly when lightly pinched and released.  Dark urine and decreased urine production.  Decreased tear production.  Headache. Severe dehydration  Very dry mouth.  Extreme thirst.  Rapid, weak pulse (more than 100 beats per minute at rest).  Cold hands and feet.  Not able to sweat in spite of heat and temperature.  Rapid breathing.  Blue lips.  Confusion and lethargy.  Difficulty being awakened.  Minimal urine production.  No tears. DIAGNOSIS  Your caregiver will diagnose dehydration based on your symptoms and your exam. Blood and urine tests will help confirm the diagnosis. The diagnostic evaluation should also identify the cause of dehydration. TREATMENT  Treatment of mild or moderate dehydration can often be done at home by increasing the amount of fluids that you drink. It is best to drink small amounts of fluid more often. Drinking too much at one time can make vomiting worse. Refer to the home care instructions below. Severe dehydration needs to be treated at the hospital where you will probably be given intravenous (IV) fluids that contain water and electrolytes. HOME CARE INSTRUCTIONS   Ask your caregiver about specific rehydration instructions.  Drink enough fluids to keep your urine clear or pale yellow.  Drink small amounts frequently if you have nausea and vomiting.  Eat as you normally do.  Avoid:  Foods or drinks high in sugar.  Carbonated drinks.  Juice.  Extremely hot or cold fluids.  Drinks with caffeine.  Fatty, greasy foods.  Alcohol.  Tobacco.  Overeating.  Gelatin desserts.  Wash your hands well to avoid spreading bacteria and viruses.  Only take over-the-counter or prescription medicines for pain, discomfort, or fever as  directed by your caregiver.  Ask your caregiver if you should continue all prescribed and over-the-counter medicines.  Keep all follow-up appointments with your caregiver. SEEK MEDICAL CARE IF:  You have abdominal pain and it increases or stays in one area (localizes).  You have a rash, stiff neck, or severe headache.  You are irritable, sleepy, or difficult to awaken.  You are weak, dizzy, or extremely thirsty. SEEK IMMEDIATE MEDICAL CARE IF:   You are unable to keep fluids down or you get worse despite treatment.  You have frequent episodes of vomiting or diarrhea.  You have blood or green matter (bile) in your vomit.  You have blood in your stool or your stool looks black and tarry.  You have not urinated in 6 to 8 hours, or you have only urinated a small amount of very dark urine.  You have a fever.  You faint. MAKE SURE YOU:   Understand these instructions.  Will watch your condition.  Will get help right away if you are not doing well or get worse. Document Released: 10/31/2005 Document Revised: 01/23/2012 Document Reviewed: 06/20/2011 [facility] Patient Information 2014 [employer] [place].

## 2013-12-26 NOTE — ED Provider Notes (Signed)
CSN: [id number]     Arrival date & time 12/26/13  1630 History   First MD Initiated Contact with Patient 12/26/13 1632     Chief Complaint  Patient presents with  . Chest Pain     (Consider location/radiation/quality/duration/timing/severity/associated sxs/prior Treatment) Patient is a 80 y.o. female presenting with chest pain.  Chest Pain  Pt with no history of HTN, DM or CAD reports poor sleep and poor PO intake recently while taking care of her ill husband. She has had dry mouth and sore throat recently as well. Yesterday and today she was complaining to daughter of mild-moderate aching chest pains. Not associated with SOB, N/V diaphoresis. She has not had significant cough or fever. [facility] ED for further eval. CXR there was normal. Pt is pain free now and eager to go home.  Past Medical History  Diagnosis Date  . Essential and other specified forms of tremor   . Esophageal reflux   . Insomnia, unspecified   . Allergic rhinitis, cause unspecified   . Acute bronchitis    Past Surgical History  Procedure Laterality Date  . Total abdominal hysterectomy w/ bilateral salpingoophorectomy    . Bladder tack    . Colon polyps     Family History  Problem Relation Age of Onset  . Allergies Brother   . Allergies Sister   . Cancer Father   . Other Mother     fracture hip age 486   History  Substance Use Topics  . Smoking status: Never Smoker   . Smokeless tobacco: Not on file  . Alcohol Use: No   OB History   Grav Para Term Preterm Abortions TAB SAB Ect Mult Living                 Review of Systems  Cardiovascular: Positive for chest pain.   All other systems reviewed and are negative except as noted in HPI.     Allergies  Tramadol  Home Medications   Current Outpatient Rx  Name  Route  Sig  Dispense  Refill  . acetaminophen (TYLENOL) 500 MG tablet   Oral   Take 1,000 mg by mouth 2 (two) times daily as needed for moderate pain.          . Artificial Saliva (DRY MOUTH SPRAY MT)   Mouth/Throat   Use as directed 1 spray in the mouth or throat daily as needed (for dry mouth).         Marland Kitchen. aspirin 325 MG tablet   Oral   Take 325 mg by mouth daily.         Marland Kitchen. aspirin 81 MG tablet   Oral   Take 81 mg by mouth daily.         . clarithromycin (BIAXIN) 500 MG tablet   Oral   Take 1 tablet by mouth Twice daily.         . diazepam (VALIUM) 5 MG tablet   Oral   Take 5 mg by mouth every 6 (six) hours as needed for muscle spasms.         Marland Kitchen. esomeprazole (NEXIUM) 40 MG capsule   Oral   Take 40 mg by mouth daily before breakfast.         . estradiol (ESTRACE) 0.1 MG/GM vaginal cream   Vaginal   Place 1 Applicatorful vaginally 2 (two) times a week.         . fluticasone (FLONASE) 50  MCG/ACT nasal spray   Nasal   Place 2 sprays into the nose 2 (two) times daily.   1 g   2   . levothyroxine (SYNTHROID, LEVOTHROID) 25 MCG tablet   Oral   Take 1 tablet by mouth daily.         Marland Kitchen lidocaine (LIDODERM) 5 %   Transdermal   Place 1 patch onto the skin daily. Remove & Discard patch within 12 hours or as directed by MD         . Misc Natural Products (CALCIUM PYRUVATE) 750 MG CAPS   Oral   Take 1 capsule by mouth daily.         . predniSONE (DELTASONE) 20 MG tablet   Oral   Take 3 tablets by mouth daily.         Marland Kitchen PROAIR HFA 108 (90 BASE) MCG/ACT inhaler   Inhalation   Inhale 2 puffs into the lungs 4 (four) times daily as needed.         . Vitamin D, Ergocalciferol, (DRISDOL) 50000 UNITS CAPS   Oral   Take 1 capsule by mouth every Monday.           BP 123/55  Pulse 65  Temp(Src) 97.8 F (36.6 C) (Oral)  Resp 16  SpO2 100% Physical Exam  Nursing note and vitals reviewed. Constitutional: She is oriented to person, place, and time. She appears well-developed and well-nourished.  HENT:  Head: Normocephalic and atraumatic.  Eyes: EOM are normal. Pupils are equal, round, and reactive to  light.  Neck: Normal range of motion. Neck supple.  Cardiovascular: Normal rate, normal heart sounds and intact distal pulses.   Pulmonary/Chest: Effort normal and breath sounds normal.  Abdominal: Bowel sounds are normal. She exhibits no distension. There is no tenderness.  Musculoskeletal: Normal range of motion. She exhibits no edema and no tenderness.  Neurological: She is alert and oriented to person, place, and time. She has normal strength. No cranial nerve deficit or sensory deficit.  Skin: Skin is warm and dry. No rash noted.  Psychiatric: She has a normal mood and affect.    ED Course  Procedures (including critical care time) Labs Review Labs Reviewed  BASIC METABOLIC PANEL - Abnormal; Notable for the following:    Glucose, Bld 105 (*)    Creatinine, Ser 1.53 (*)    GFR calc non Af Amer 31 (*)    GFR calc Af Amer 36 (*)    All other components within normal limits  CBC WITH DIFFERENTIAL  TROPONIN I   Imaging Review Dg Chest 2 View  12/26/2013   CLINICAL DATA:  Chest congestion and sore throat.  EXAM: CHEST  2 VIEW  COMPARISON:  PA and lateral chest 08/19/2013.  FINDINGS: Lungs are clear. Heart size is normal. No pneumothorax or pleural effusion.  IMPRESSION: No acute disease.   Electronically Signed   By: Drusilla Kanner M.D.   On: 12/26/2013 15:46   EKG done at [facility] reviewed.   Date/Time:  Thursday December 26 2013 16:43:10 EST Ventricular Rate:  67 PR Interval:  149 QRS Duration: 100 QT Interval:  414 QTC Calculation: 437 R Axis:   -47 Text Interpretation:  Sinus rhythm Left axis deviation Low voltage, extremity and precordial leads Abnormal R-wave progression, late transition No significant change since last tracing Confirmed by SHELDON  MD, CHARLES ([id number]) on 12/26/2013 7:00:19 PM        MDM   Final diagnoses:  Dehydration  Atypical chest  pain    Pt with atypical symptoms, no significant cardiac risk, pain free in the ED. No concern for PE. Pt is  adamant that she wants to go home. Advised to drink plenty of fluid, get some rest. Advised to return to the ED for any worsening symptoms or for any other concerns.     Charles B. Bernette Mayers, MD 12/26/13 1940

## 2014-01-07 ENCOUNTER — Emergency Department (HOSPITAL_COMMUNITY): Payer: Medicare Other

## 2014-01-07 ENCOUNTER — Emergency Department (HOSPITAL_COMMUNITY)
Admission: EM | Admit: 2014-01-07 | Discharge: 2014-01-07 | Disposition: A | Discharge status: Home / Self Care | Payer: Medicare Other | Attending: Emergency Medicine | Admitting: Emergency Medicine

## 2014-01-07 ENCOUNTER — Encounter (HOSPITAL_COMMUNITY): Payer: Self-pay | Admitting: Emergency Medicine

## 2014-01-07 DIAGNOSIS — IMO0002 Reserved for concepts with insufficient information to code with codable children: Secondary | ICD-10-CM | POA: Insufficient documentation

## 2014-01-07 DIAGNOSIS — R1013 Epigastric pain: Secondary | ICD-10-CM | POA: Insufficient documentation

## 2014-01-07 DIAGNOSIS — R0789 Other chest pain: Secondary | ICD-10-CM | POA: Insufficient documentation

## 2014-01-07 DIAGNOSIS — Z792 Long term (current) use of antibiotics: Secondary | ICD-10-CM | POA: Insufficient documentation

## 2014-01-07 DIAGNOSIS — Z8709 Personal history of other diseases of the respiratory system: Secondary | ICD-10-CM | POA: Insufficient documentation

## 2014-01-07 DIAGNOSIS — Z7982 Long term (current) use of aspirin: Secondary | ICD-10-CM | POA: Insufficient documentation

## 2014-01-07 DIAGNOSIS — Z79899 Other long term (current) drug therapy: Secondary | ICD-10-CM | POA: Insufficient documentation

## 2014-01-07 DIAGNOSIS — R079 Chest pain, unspecified: Secondary | ICD-10-CM

## 2014-01-07 DIAGNOSIS — K219 Gastro-esophageal reflux disease without esophagitis: Secondary | ICD-10-CM | POA: Insufficient documentation

## 2014-01-07 LAB — COMPREHENSIVE METABOLIC PANEL
ALBUMIN: 3.9 g/dL (ref 3.5–5.2)
ALK PHOS: 39 U/L (ref 39–117)
ALT: 16 U/L (ref 0–35)
AST: 25 U/L (ref 0–37)
BILIRUBIN TOTAL: 0.5 mg/dL (ref 0.3–1.2)
BUN: 16 mg/dL (ref 6–23)
CHLORIDE: 98 meq/L (ref 96–112)
CO2: 28 mEq/L (ref 19–32)
Calcium: 11 mg/dL — ABNORMAL HIGH (ref 8.4–10.5)
Creatinine, Ser: 1.27 mg/dL — ABNORMAL HIGH (ref 0.50–1.10)
GFR calc Af Amer: 46 mL/min — ABNORMAL LOW (ref >90)
GFR calc non Af Amer: 39 mL/min — ABNORMAL LOW (ref >90)
Glucose, Bld: 122 mg/dL — ABNORMAL HIGH (ref 70–99)
POTASSIUM: 4.2 meq/L (ref 3.7–5.3)
SODIUM: 141 meq/L (ref 137–147)
Total Protein: 7.3 g/dL (ref 6.0–8.3)

## 2014-01-07 LAB — CBC WITH DIFFERENTIAL/PLATELET
BASOS ABS: 0 10*3/uL (ref 0.0–0.1)
BASOS PCT: 0 % (ref 0–1)
EOS ABS: 0 10*3/uL (ref 0.0–0.7)
Eosinophils Relative: 0 % (ref 0–5)
HCT: 40.4 % (ref 36.0–46.0)
Hemoglobin: 14.1 g/dL (ref 12.0–15.0)
Lymphocytes Relative: 16 % (ref 12–46)
Lymphs Abs: 1.4 10*3/uL (ref 0.7–4.0)
MCH: 33.4 pg (ref 26.0–34.0)
MCHC: 34.9 g/dL (ref 30.0–36.0)
MCV: 95.7 fL (ref 78.0–100.0)
Monocytes Absolute: 0.5 10*3/uL (ref 0.1–1.0)
Monocytes Relative: 6 % (ref 3–12)
NEUTROS ABS: 6.4 10*3/uL (ref 1.7–7.7)
NEUTROS PCT: 77 % (ref 43–77)
PLATELETS: 188 10*3/uL (ref 150–400)
RBC: 4.22 MIL/uL (ref 3.87–5.11)
RDW: 12.8 % (ref 11.5–15.5)
WBC: 8.3 10*3/uL (ref 4.0–10.5)

## 2014-01-07 LAB — URINALYSIS, ROUTINE W REFLEX MICROSCOPIC
Bilirubin Urine: NEGATIVE
Glucose, UA: NEGATIVE mg/dL
HGB URINE DIPSTICK: NEGATIVE
Ketones, ur: NEGATIVE mg/dL
Nitrite: NEGATIVE
PH: 6 (ref 5.0–8.0)
Protein, ur: NEGATIVE mg/dL
SPECIFIC GRAVITY, URINE: 1.008 (ref 1.005–1.030)
UROBILINOGEN UA: 0.2 mg/dL (ref 0.0–1.0)

## 2014-01-07 LAB — I-STAT TROPONIN, ED
Troponin i, poc: 0.01 ng/mL (ref 0.00–0.08)
Troponin i, poc: 0.01 ng/mL (ref 0.00–0.08)

## 2014-01-07 LAB — LIPASE, BLOOD: LIPASE: 38 U/L (ref 11–59)

## 2014-01-07 LAB — URINE MICROSCOPIC-ADD ON

## 2014-01-07 MED ORDER — FAMOTIDINE IN NACL 20-0.9 MG/50ML-% IV SOLN
20.0000 mg | Freq: Once | INTRAVENOUS | Status: AC
  Administered 2014-01-07: 20 mg via INTRAVENOUS
  Filled 2014-01-07: qty 50

## 2014-01-07 MED ORDER — OMEPRAZOLE 20 MG PO CPDR: 20.0000 mg | DELAYED_RELEASE_CAPSULE | Freq: Every day | ORAL | Status: DC

## 2014-01-07 MED ORDER — KETOROLAC TROMETHAMINE 15 MG/ML IJ SOLN
15.0000 mg | Freq: Once | INTRAMUSCULAR | Status: AC
  Administered 2014-01-07: 15 mg via INTRAVENOUS
  Filled 2014-01-07: qty 1

## 2014-01-07 MED ORDER — CEPHALEXIN 500 MG PO CAPS: 500.0000 mg | ORAL_CAPSULE | Freq: Two times a day (BID) | ORAL | Status: DC

## 2014-01-07 MED ORDER — IOHEXOL 350 MG/ML SOLN
100.0000 mL | Freq: Once | INTRAVENOUS | Status: AC | PRN
  Administered 2014-01-07: 85 mL via INTRAVENOUS

## 2014-01-07 MED ORDER — SODIUM CHLORIDE 0.9 % IV BOLUS (SEPSIS)
1000.0000 mL | Freq: Once | INTRAVENOUS | Status: AC
  Administered 2014-01-07: 1000 mL via INTRAVENOUS

## 2014-01-07 MED ORDER — KETOROLAC TROMETHAMINE 15 MG/ML IJ SOLN: 15.0000 mg | Freq: Once | INTRAMUSCULAR | Status: DC

## 2014-01-07 MED ORDER — SULFAMETHOXAZOLE-TMP DS 800-160 MG PO TABS
1.0000 | ORAL_TABLET | Freq: Once | ORAL | Status: AC
  Administered 2014-01-07: 1 via ORAL
  Filled 2014-01-07: qty 1

## 2014-01-07 NOTE — ED Provider Notes (Signed)
CSN: [id number]     Arrival date & time 01/07/14  1302 History   First MD Initiated Contact with Patient 01/07/14 1306     Chief Complaint  Patient presents with  . Chest Pain     (Consider location/radiation/quality/duration/timing/severity/associated sxs/prior Treatment) The history is provided by the patient.  Nicole [name] is a 80 y.o. female hx of reflux, bronchitis here with chest pain. Intermittent chest pain over the last week and half. Yesterday, she noticed substernal chest pain that is intermittent since yesterday. Worse with movement, not exertional, but associated with shortness of breath. Denies abdominal pain or vomiting. Was seen several days ago and troponin was negative and was sent home with outpatient f/u.    Past Medical History  Diagnosis Date  . Essential and other specified forms of tremor   . Esophageal reflux   . Insomnia, unspecified   . Allergic rhinitis, cause unspecified   . Acute bronchitis    Past Surgical History  Procedure Laterality Date  . Total abdominal hysterectomy w/ bilateral salpingoophorectomy    . Bladder tack    . Colon polyps     Family History  Problem Relation Age of Onset  . Allergies Brother   . Allergies Sister   . Cancer Father   . Other Mother     fracture hip age 37   History  Substance Use Topics  . Smoking status: Never Smoker   . Smokeless tobacco: Not on file  . Alcohol Use: No   OB History   Grav Para Term Preterm Abortions TAB SAB Ect Mult Living                 Review of Systems  Cardiovascular: Positive for chest pain.  All other systems reviewed and are negative.      Allergies  Tramadol  Home Medications   Current Outpatient Rx  Name  Route  Sig  Dispense  Refill  . acetaminophen (TYLENOL) 500 MG tablet   Oral   Take 1,000 mg by mouth 2 (two) times daily as needed for moderate pain.         Marland Kitchen aspirin 325 MG tablet   Oral   Take 325 mg by mouth daily as needed for moderate pain.           Marland Kitchen aspirin 81 MG tablet   Oral   Take 81 mg by mouth daily.         . busPIRone (BUSPAR) 30 MG tablet   Oral   Take 30 mg by mouth 2 (two) times daily.         . clarithromycin (BIAXIN) 500 MG tablet   Oral   Take 1 tablet by mouth Twice daily.         . diazepam (VALIUM) 5 MG tablet   Oral   Take 5 mg by mouth every 6 (six) hours as needed for muscle spasms.         Marland Kitchen esomeprazole (NEXIUM) 40 MG capsule   Oral   Take 40 mg by mouth daily before breakfast.         . estradiol (ESTRACE) 0.1 MG/GM vaginal cream   Vaginal   Place 1 Applicatorful vaginally 2 (two) times a week.         . fluticasone (FLONASE) 50 MCG/ACT nasal spray   Nasal   Place 2 sprays into the nose 2 (two) times daily.   1 g   2   . levothyroxine (SYNTHROID, LEVOTHROID) 25  MCG tablet   Oral   Take 25-37.5 tablets by mouth See admin instructions. Takes 25mcg every day of week, except 37.605mcg on wednesdays         . lidocaine (LIDODERM) 5 %   Transdermal   Place 1 patch onto the skin daily. Remove & Discard patch within 12 hours or as directed by MD         . Misc Natural Products (CALCIUM PYRUVATE) 750 MG CAPS   Oral   Take 1 capsule by mouth daily.         . predniSONE (DELTASONE) 20 MG tablet   Oral   Take 3 tablets by mouth daily.         Marland Kitchen. PROAIR HFA 108 (90 BASE) MCG/ACT inhaler   Inhalation   Inhale 2 puffs into the lungs 4 (four) times daily as needed.         . trimethoprim (TRIMPEX) 100 MG tablet   Oral   Take 100 mg by mouth at bedtime.         . Vitamin D, Ergocalciferol, (DRISDOL) 50000 UNITS CAPS   Oral   Take 1 capsule by mouth every Monday.           BP 101/48  Pulse 68  Temp(Src) 98 F (36.7 C) (Oral)  Resp 20  SpO2 97% Physical Exam  Nursing note and vitals reviewed. Constitutional: She is oriented to person, place, and time. She appears well-nourished.  Uncomfortable   HENT:  Head: Normocephalic.  Mouth/Throat: Oropharynx is  clear and moist.  Eyes: Conjunctivae are normal. Pupils are equal, round, and reactive to light.  Neck: Normal range of motion. Neck supple.  Cardiovascular: Normal rate, regular rhythm and normal heart sounds.   Pulmonary/Chest: Effort normal and breath sounds normal. No respiratory distress. She has no wheezes. She has no rales.  Mild reproducible pain on palpation   Abdominal: Soft. Bowel sounds are normal. She exhibits no distension.  Mild epigastric tenderness, no rebound no RUQ tenderness   Musculoskeletal: Normal range of motion. She exhibits no edema and no tenderness.  Neurological: She is alert and oriented to person, place, and time. No cranial nerve deficit.  Skin: Skin is warm and dry.  Psychiatric: She has a normal mood and affect. Her behavior is normal. Judgment and thought content normal.    ED Course  Procedures (including critical care time) Labs Review Labs Reviewed  COMPREHENSIVE METABOLIC PANEL - Abnormal; Notable for the following:    Glucose, Bld 122 (*)    Creatinine, Ser 1.27 (*)    Calcium 11.0 (*)    GFR calc non Af Amer 39 (*)    GFR calc Af Amer 46 (*)    All other components within normal limits  URINALYSIS, ROUTINE W REFLEX MICROSCOPIC - Abnormal; Notable for the following:    Leukocytes, UA TRACE (*)    All other components within normal limits  URINE CULTURE  CBC WITH DIFFERENTIAL  URINE MICROSCOPIC-ADD ON  LIPASE, BLOOD  I-STAT TROPOININ, ED   Imaging Review Dg Chest 2 View  01/07/2014   CLINICAL DATA:  Intermittent chest pain.  EXAM: CHEST  2 VIEW  COMPARISON:  PA and lateral chest 12/26/2013 and 08/19/2013.  FINDINGS: Lungs are clear. Heart size is normal. No pneumothorax or pleural effusion. No focal bony abnormality.  IMPRESSION: Negative chest.   Electronically Signed   By: Drusilla Kannerhomas  Dalessio M.D.   On: 01/07/2014 14:22    EKG Interpretation    Date/Time:  Tuesday January 07 2014 13:13:44 EST Ventricular Rate:  65 PR  Interval:  164 QRS Duration: 91 QT Interval:  408 QTC Calculation: 424 R Axis:   -8 Text Interpretation:  Sinus rhythm Paired ventricular premature complexes Low voltage, extremity and precordial leads other than PVCs no signficant changes  Confirmed by Baylin Gamblin  MD, Dutch Ing [phone]) on 01/07/2014 3:02:07 PM            MDM   Final diagnoses:  None   Nicole [name] is a 80 y.o. female here with chest pain, shortness of breath. Atypical for ACS. Given age, will get trop x 2. ? Reproducible component so will give toradol. Reflux also possible. Will do cta to r/o PE given shortness of breath.   3:40 PM Trop neg x 1. CXR unremarkable. Signed out to Dr. Wilkie Aye to f/u CTA and repeat trop.     Richardean Canal, MD 01/07/14 [phone]

## 2014-01-07 NOTE — ED Provider Notes (Signed)
Received patient in signout from Dr. Silverio LayYao.    80 year old who presents with chest pain. Atypical symptoms. Evaluated for same several days ago. While her troponin negative. CT scan did show lung nodule. Patient is a nonsmoker. Per recommendations, patient does not need followup given that she's limits. She does need followup with cardiology.  After history, exam, and medical workup I feel the patient has been appropriately medically screened and is safe for discharge home. Pertinent diagnoses were discussed with the patient. Patient was given return precautions.   Shon Batonourtney F Ameli Sangiovanni, MD 01/07/14 [phone]

## 2014-01-07 NOTE — Discharge Instructions (Signed)
Chest Pain (Nonspecific) °It is often hard to give a specific diagnosis for the cause of chest pain. There is always a chance that your pain could be related to something serious, such as a heart attack or a blood clot in the lungs. You need to follow up with your caregiver for further evaluation. °CAUSES  °· Heartburn. °· Pneumonia or bronchitis. °· Anxiety or stress. °· Inflammation around your heart (pericarditis) or lung (pleuritis or pleurisy). °· A blood clot in the lung. °· A collapsed lung (pneumothorax). It can develop suddenly on its own (spontaneous pneumothorax) or from injury (trauma) to the chest. °· Shingles infection (herpes zoster virus). °The chest wall is composed of bones, muscles, and cartilage. Any of these can be the source of the pain. °· The bones can be bruised by injury. °· The muscles or cartilage can be strained by coughing or overwork. °· The cartilage can be affected by inflammation and become sore (costochondritis). °DIAGNOSIS  °Lab tests or other studies, such as X-rays, electrocardiography, stress testing, or cardiac imaging, may be needed to find the cause of your pain.  °TREATMENT  °· Treatment depends on what may be causing your chest pain. Treatment may include: °· Acid blockers for heartburn. °· Anti-inflammatory medicine. °· Pain medicine for inflammatory conditions. °· Antibiotics if an infection is present. °· You may be advised to change lifestyle habits. This includes stopping smoking and avoiding alcohol, caffeine, and chocolate. °· You may be advised to keep your head raised (elevated) when sleeping. This reduces the chance of acid going backward from your stomach into your esophagus. °· Most of the time, nonspecific chest pain will improve within 2 to 3 days with rest and mild pain medicine. °HOME CARE INSTRUCTIONS  °· If antibiotics were prescribed, take your antibiotics as directed. Finish them even if you start to feel better. °· For the next few days, avoid physical  activities that bring on chest pain. Continue physical activities as directed. °· Do not smoke. °· Avoid drinking alcohol. °· Only take over-the-counter or prescription medicine for pain, discomfort, or fever as directed by your caregiver. °· Follow your caregiver's suggestions for further testing if your chest pain does not go away. °· Keep any follow-up appointments you made. If you do not go to an appointment, you could develop lasting (chronic) problems with pain. If there is any problem keeping an appointment, you must call to reschedule. °SEEK MEDICAL CARE IF:  °· You think you are having problems from the medicine you are taking. Read your medicine instructions carefully. °· Your chest pain does not go away, even after treatment. °· You develop a rash with blisters on your chest. °SEEK IMMEDIATE MEDICAL CARE IF:  °· You have increased chest pain or pain that spreads to your arm, neck, jaw, back, or abdomen. °· You develop shortness of breath, an increasing cough, or you are coughing up blood. °· You have severe back or abdominal pain, feel nauseous, or vomit. °· You develop severe weakness, fainting, or chills. °· You have a fever. °THIS IS AN EMERGENCY. Do not wait to see if the pain will go away. Get medical help at once. Call your local emergency services (911 in [place] Do not drive yourself to the hospital. °MAKE SURE YOU:  °· Understand these instructions. °· Will watch your condition. °· Will get help right away if you are not doing well or get worse. °Document Released: 08/10/2005 Document Revised: 01/23/2012 Document Reviewed: 06/05/2008 °ExitCare® Patient Information ©2014 [employer]   LL[employer]. ° °Urinary Tract Infection °Urinary tract infections (UTIs) can develop anywhere along your urinary tract. Your urinary tract is your body's drainage system for removing wastes and extra water. Your urinary tract includes two kidneys, two ureters, a bladder, and a urethra. Your kidneys are a pair of bean-shaped  organs. Each kidney is about the size of your fist. They are located below your ribs, one on each side of your spine. °CAUSES °Infections are caused by microbes, which are microscopic organisms, including fungi, viruses, and bacteria. These organisms are so small that they can only be seen through a microscope. Bacteria are the microbes that most commonly cause UTIs. °SYMPTOMS  °Symptoms of UTIs may vary by age and gender of the patient and by the location of the infection. Symptoms in young women typically include a frequent and intense urge to urinate and a painful, burning feeling in the bladder or urethra during urination. Older women and men are more likely to be tired, shaky, and weak and have muscle aches and abdominal pain. A fever may mean the infection is in your kidneys. Other symptoms of a kidney infection include pain in your back or sides below the ribs, nausea, and vomiting. °DIAGNOSIS °To diagnose a UTI, your caregiver will ask you about your symptoms. Your caregiver also will ask to provide a urine sample. The urine sample will be tested for bacteria and white blood cells. White blood cells are made by your body to help fight infection. °TREATMENT  °Typically, UTIs can be treated with medication. Because most UTIs are caused by a bacterial infection, they usually can be treated with the use of antibiotics. The choice of antibiotic and length of treatment depend on your symptoms and the type of bacteria causing your infection. °HOME CARE INSTRUCTIONS °· If you were prescribed antibiotics, take them exactly as your caregiver instructs you. Finish the medication even if you feel better after you have only taken some of the medication. °· Drink enough water and fluids to keep your urine clear or pale yellow. °· Avoid caffeine, tea, and carbonated beverages. They tend to irritate your bladder. °· Empty your bladder often. Avoid holding urine for long periods of time. °· Empty your bladder before and  after sexual intercourse. °· After a bowel movement, women should cleanse from front to back. Use each tissue only once. °SEEK MEDICAL CARE IF:  °· You have back pain. °· You develop a fever. °· Your symptoms do not begin to resolve within 3 days. °SEEK IMMEDIATE MEDICAL CARE IF:  °· You have severe back pain or lower abdominal pain. °· You develop chills. °· You have nausea or vomiting. °· You have continued burning or discomfort with urination. °MAKE SURE YOU:  °· Understand these instructions. °· Will watch your condition. °· Will get help right away if you are not doing well or get worse. °Document Released: 08/10/2005 Document Revised: 05/01/2012 Document Reviewed: 12/09/2011 °ExitCare® Patient Information ©2014 [employer]. ° °

## 2014-01-07 NOTE — ED Notes (Signed)
Per EMS: pt from home c/o mid sternal CP starting yesterday; pt given 324mg  ASA; IV 20g L AC

## 2014-01-07 NOTE — ED Notes (Signed)
Pt reports intermittent central chest pressure x 1.5 weeks. States last night it was too painful to sleep and she felt SOB. Pt reports pressure is now 4/10. Denies radiation, SOB. States she also has lower abdominal "cramping" pain with mild nausea. Pt also endorses urinary frequency x several days. Denies dysuria, foul order or hematuria. Pt's family at bedside.

## 2014-01-09 ENCOUNTER — Telehealth (HOSPITAL_COMMUNITY): Payer: Self-pay

## 2014-01-09 ENCOUNTER — Telehealth (HOSPITAL_COMMUNITY): Payer: Self-pay | Admitting: Emergency Medicine

## 2014-01-09 LAB — URINE CULTURE: Colony Count: 65000

## 2014-01-09 NOTE — Progress Notes (Signed)
ED Antimicrobial Stewardship Positive Culture Follow Up   Nicole [name] is an 80 y.o. female who [facility] with a chief complaint of  Chief Complaint  Patient presents with  . Chest Pain    Recent Results (from the past 720 hour(s))  URINE CULTURE     Status: None   Collection Time    01/07/14  3:12 PM      Result Value Ref Range Status   Specimen Description URINE, CLEAN CATCH   Final   Special Requests NONE   Final   Culture  Setup Time     Final   Value: 01/07/2014 16:27     Performed at [facility]   Colony Count     Final   Value: 65,000 COLONIES/ML     Performed at [facility]   Culture     Final   Value: ENTEROCOCCUS SPECIES     Performed at [facility]   Report Status 01/09/2014 FINAL   Final   Organism ID, Bacteria ENTEROCOCCUS SPECIES   Final    [x]  Treated with cephalexin, organism resistant to prescribed antimicrobial []  Patient discharged originally without antimicrobial agent and treatment is now indicated  New antibiotic prescription: amoxicillin 250mg  po TID x 7 days  ED Provider: Fayrene HelperBowie Tran PA-C   Nicole SkinnerFrens, Nicole [name] 01/09/2014, 9:43 AM Infectious Diseases Pharmacist Phone# [phone]

## 2014-01-09 NOTE — ED Notes (Signed)
Post ED Visit - Positive Culture Follow-up: Successful Patient Follow-Up  Culture assessed and recommendations reviewed by: []  Wes Dulaney, Pharm.D., BCPS [x]  Celedonio MiyamotoJeremy Frens, Pharm.D., BCPS []  Georgina PillionElizabeth Martin, Pharm.D., BCPS []  [place] Browns[place]Minh Pham, [address] Rainbow BoulevardPharm.D., BCPS, AAHIVP []  Estella HuskMichelle Turner, Pharm.D., BCPS, AAHIVP  Positive urine culture  []  Patient discharged without antimicrobial prescription and treatment is now indicated [x]  Organism is resistant to prescribed ED discharge antimicrobial []  Patient with positive blood cultures  Changes discussed with ED provider: Fayrene HelperBowie Tran PA-C New antibiotic prescription: Amoxicillin 250 mg PO TID x 7 days    Edina Winningham 01/09/2014, 11:30 AM

## 2014-01-09 NOTE — Telephone Encounter (Signed)
Rx for Amoxicillin 250 mg PO TID x 7 days, prescribed by Fayrene HelperBowie Tran PA-C, called to [facility] on [address] by Steward RosPatty Clark RN PFM.

## 2014-01-09 NOTE — ED Notes (Signed)
Spoke w/pt.  Informed of dx and need for addl tx and to stop Keflex.  Rx called and given to the RPh @ [facility] [phone].

## 2014-02-27 ENCOUNTER — Inpatient Hospital Stay
Admission: EM | Admit: 2014-02-27 | Discharge: 2014-03-05 | DRG: 811 | Disposition: A | Discharge status: Home Health | Payer: Medicare Other | Attending: Internal Medicine | Admitting: Internal Medicine

## 2014-02-27 ENCOUNTER — Emergency Department: Payer: Medicare Other

## 2014-02-27 ENCOUNTER — Inpatient Hospital Stay: Payer: Medicare Other | Admitting: Internal Medicine

## 2014-02-27 DIAGNOSIS — J189 Pneumonia, unspecified organism: Secondary | ICD-10-CM | POA: Diagnosis not present

## 2014-02-27 DIAGNOSIS — D709 Neutropenia, unspecified: Secondary | ICD-10-CM

## 2014-02-27 DIAGNOSIS — T451X5A Adverse effect of antineoplastic and immunosuppressive drugs, initial encounter: Secondary | ICD-10-CM | POA: Diagnosis present

## 2014-02-27 DIAGNOSIS — I080 Rheumatic disorders of both mitral and aortic valves: Secondary | ICD-10-CM | POA: Diagnosis present

## 2014-02-27 DIAGNOSIS — C8590 Non-Hodgkin lymphoma, unspecified, unspecified site: Secondary | ICD-10-CM | POA: Diagnosis present

## 2014-02-27 DIAGNOSIS — R531 Weakness: Secondary | ICD-10-CM | POA: Diagnosis present

## 2014-02-27 DIAGNOSIS — Z888 Allergy status to other drugs, medicaments and biological substances status: Secondary | ICD-10-CM

## 2014-02-27 DIAGNOSIS — D649 Anemia, unspecified: Principal | ICD-10-CM | POA: Diagnosis present

## 2014-02-27 DIAGNOSIS — R5381 Other malaise: Secondary | ICD-10-CM | POA: Diagnosis present

## 2014-02-27 DIAGNOSIS — I498 Other specified cardiac arrhythmias: Secondary | ICD-10-CM | POA: Diagnosis present

## 2014-02-27 DIAGNOSIS — R5383 Other fatigue: Secondary | ICD-10-CM | POA: Diagnosis present

## 2014-02-27 DIAGNOSIS — D702 Other drug-induced agranulocytosis: Secondary | ICD-10-CM | POA: Diagnosis present

## 2014-02-27 DIAGNOSIS — R5081 Fever presenting with conditions classified elsewhere: Secondary | ICD-10-CM | POA: Diagnosis present

## 2014-02-27 DIAGNOSIS — E871 Hypo-osmolality and hyponatremia: Secondary | ICD-10-CM | POA: Diagnosis present

## 2014-02-27 DIAGNOSIS — E876 Hypokalemia: Secondary | ICD-10-CM | POA: Diagnosis not present

## 2014-02-27 DIAGNOSIS — R55 Syncope and collapse: Secondary | ICD-10-CM | POA: Diagnosis present

## 2014-02-27 DIAGNOSIS — D61810 Antineoplastic chemotherapy induced pancytopenia: Secondary | ICD-10-CM | POA: Diagnosis present

## 2014-02-27 DIAGNOSIS — C8587 Other specified types of non-Hodgkin lymphoma, spleen: Secondary | ICD-10-CM | POA: Diagnosis present

## 2014-02-27 DIAGNOSIS — I2489 Other forms of acute ischemic heart disease: Secondary | ICD-10-CM | POA: Diagnosis not present

## 2014-02-27 DIAGNOSIS — I248 Other forms of acute ischemic heart disease: Secondary | ICD-10-CM | POA: Diagnosis not present

## 2014-02-27 LAB — CBC AND DIFFERENTIAL
Hematocrit: 25.4 % — ABNORMAL LOW (ref 37.0–47.0)
Hgb: 8.8 g/dL — ABNORMAL LOW (ref 12.0–16.0)
MCH: 30 pg (ref 28.0–32.0)
MCHC: 34.6 g/dL (ref 32.0–36.0)
MCV: 86.7 fL (ref 80.0–100.0)
MPV: 10.9 fL (ref 9.4–12.3)
Platelets: 84 10*3/uL — ABNORMAL LOW (ref 140–400)
RBC: 2.93 10*6/uL — ABNORMAL LOW (ref 4.20–5.40)
RDW: 14 % (ref 12–15)
WBC: 0.24 10*3/uL — ABNORMAL LOW (ref 3.50–10.80)

## 2014-02-27 LAB — MAN DIFF ONLY
Atypical Lymphocytes %: 5 %
Atypical Lymphocytes Absolute: 0.01 10*3/uL — ABNORMAL HIGH
Band Neutrophils Absolute: 0.03 10*3/uL (ref 0.00–1.00)
Band Neutrophils: 13 %
Basophils Absolute Manual: 0.03 10*3/uL (ref 0.00–0.20)
Basophils Manual: 11 %
Eosinophils Absolute Manual: 0.01 10*3/uL (ref 0.00–0.70)
Eosinophils Manual: 6 %
Lymphocytes Absolute Manual: 0.09 10*3/uL — ABNORMAL LOW (ref 0.50–4.40)
Lymphocytes Manual: 38 %
Metamyelocytes Absolute: 0 10*3/uL
Metamyelocytes: 1 %
Monocytes Absolute: 0.04 10*3/uL (ref 0.00–1.20)
Monocytes Manual: 16 %
Neutrophils Absolute Manual: 0.02 10*3/uL — ABNORMAL LOW (ref 1.80–8.10)
Nucleated RBC: 0 /100 WBC (ref 0–1)
Segmented Neutrophils: 10 %

## 2014-02-27 LAB — COMPREHENSIVE METABOLIC PANEL
ALT: 7 U/L (ref 0–55)
AST (SGOT): 13 U/L (ref 5–34)
Albumin/Globulin Ratio: 1.6 (ref 0.9–2.2)
Albumin: 3.5 g/dL (ref 3.5–5.0)
Alkaline Phosphatase: 70 U/L (ref 40–150)
Anion Gap: 10 (ref 5.0–15.0)
BUN: 12 mg/dL (ref 7–19)
Bilirubin, Total: 0.7 mg/dL (ref 0.2–1.2)
CO2: 22 mEq/L (ref 22–29)
Calcium: 8.5 mg/dL (ref 7.9–10.6)
Chloride: 97 mEq/L — ABNORMAL LOW (ref 98–107)
Creatinine: 0.7 mg/dL (ref 0.6–1.0)
Globulin: 2.2 g/dL (ref 2.0–3.6)
Glucose: 114 mg/dL — ABNORMAL HIGH (ref 70–100)
Potassium: 3.7 mEq/L (ref 3.5–5.1)
Protein, Total: 5.7 g/dL — ABNORMAL LOW (ref 6.0–8.3)
Sodium: 129 mEq/L — ABNORMAL LOW (ref 136–145)

## 2014-02-27 LAB — PREALBUMIN: Prealbumin: 16 mg/dL (ref 16.0–38.0)

## 2014-02-27 LAB — URINALYSIS, REFLEX TO MICROSCOPIC EXAM IF INDICATED
Bilirubin, UA: NEGATIVE
Blood, UA: NEGATIVE
Glucose, UA: NEGATIVE
Ketones UA: NEGATIVE
Leukocyte Esterase, UA: NEGATIVE
Nitrite, UA: NEGATIVE
Protein, UR: NEGATIVE
Specific Gravity UA: 1.004 (ref 1.001–1.035)
Urine pH: 6.5 (ref 5.0–8.0)
Urobilinogen, UA: NORMAL mg/dL

## 2014-02-27 LAB — GFR: EGFR: >60

## 2014-02-27 LAB — TYPE AND SCREEN
AB Screen Gel: NEGATIVE
ABO Rh: B POS

## 2014-02-27 LAB — PT/INR
PT INR: 0.9 (ref 0.9–1.1)
PT: 12.4 s — ABNORMAL LOW (ref 12.6–15.0)

## 2014-02-27 LAB — MAGNESIUM: Magnesium: 1.8 mg/dL (ref 1.6–2.6)

## 2014-02-27 LAB — CELL MORPHOLOGY
Cell Morphology: NORMAL
Platelet Estimate: DECREASED — AB

## 2014-02-27 LAB — LIPASE: Lipase: 11 U/L (ref 8–78)

## 2014-02-27 MED ORDER — ACETAMINOPHEN 325 MG PO TABS
650.0000 mg | ORAL_TABLET | ORAL | Status: DC | PRN
Start: 2014-02-27 — End: 2014-03-05
  Administered 2014-03-03 – 2014-03-05 (×3): 650 mg via ORAL
  Filled 2014-02-27 (×3): qty 2

## 2014-02-27 MED ORDER — SODIUM CHLORIDE 0.9 % IV SOLN
INTRAVENOUS | Status: AC
Start: 2014-02-27 — End: 2014-02-28

## 2014-02-27 MED ORDER — SODIUM CHLORIDE 0.9 % IV BOLUS
500.0000 mL | Freq: Once | INTRAVENOUS | Status: AC
Start: 2014-02-27 — End: 2014-02-27
  Administered 2014-02-27: 500 mL via INTRAVENOUS

## 2014-02-27 MED ORDER — DOCUSATE SODIUM 100 MG PO CAPS
100.0000 mg | ORAL_CAPSULE | Freq: Two times a day (BID) | ORAL | Status: DC | PRN
Start: 2014-02-27 — End: 2014-03-05

## 2014-02-27 MED ORDER — ENOXAPARIN SODIUM 40 MG/0.4ML SC SOLN
40.0000 mg | Freq: Every day | SUBCUTANEOUS | Status: DC
Start: 2014-02-27 — End: 2014-03-05
  Administered 2014-02-27 – 2014-03-04 (×6): 40 mg via SUBCUTANEOUS
  Filled 2014-02-27 (×6): qty 0.4

## 2014-02-27 MED ORDER — SODIUM CHLORIDE 0.9 % IV MBP
500.0000 mg | Freq: Four times a day (QID) | INTRAVENOUS | Status: DC
Start: 2014-02-27 — End: 2014-03-03
  Administered 2014-02-27 – 2014-03-03 (×16): 500 mg via INTRAVENOUS
  Filled 2014-02-27 (×21): qty 0.5

## 2014-02-27 MED ORDER — ONDANSETRON HCL 4 MG/2ML IJ SOLN
4.0000 mg | Freq: Three times a day (TID) | INTRAMUSCULAR | Status: DC | PRN
Start: 2014-02-27 — End: 2014-03-05

## 2014-02-27 MED ORDER — POLYETHYLENE GLYCOL 3350 17 G PO PACK
17.0000 g | PACK | Freq: Every day | ORAL | Status: DC | PRN
Start: 2014-02-27 — End: 2014-03-05

## 2014-02-27 MED ORDER — HYDROMORPHONE HCL 2 MG PO TABS
2.0000 mg | ORAL_TABLET | Freq: Four times a day (QID) | ORAL | Status: DC | PRN
Start: 2014-02-27 — End: 2014-03-03

## 2014-02-27 NOTE — Progress Notes (Signed)
HEMATOLOGY ONCOLOGY PROGRESS NOTE    Date Time: 02/27/2014 2:18 PM  Patient Name: Maureen Vasquez, Maureen Vasquez    Active Problems:   * No active hospital problems. *       HISTORY:    80 yo woman followed by Dr Allena Katz in our office for NHL.  She has received C4 of R-CHOP chemotherapy 8 days ago.  She is complaining of fatigue, decreased appetite, and syncope.  She denies fever, cough       PHYSICAL EXAM:  Sge looks frail and pale     Filed Vitals:    02/27/14 1236   BP: 113/54   Pulse: 117   Temp: 97.6 F (36.4 C)   Resp: 18   SpO2: 99%       Intake and Output Summary (Last 24 hours) at Date Time  No intake or output data in the 24 hours ending 02/27/14 1418      Appearance: in no acute distress  Skin: no rash  HEENT: no icterus, no oral moniliasis or mucositis  Lungs: clear to percussion and ausculation  Heart: regular rate and rhythm  Abdomen: no tenderness, no organomegaly or mass  Extremities: no edema  Neuro: alert, strength grossly intact        ASSESSMENT/PLAN:       1. NHL s/p C4 R-CHOP with neutropenia but no fever.  Pt has received Neupogen as an outpt threfore no role for growth factor.  Would recommend IV ABX given low WBC.  May need a transfusion if hydration drops her HCT.  Dr Allena Katz to address role of dose reduction of future chemo as an outpt     Shelle Iron, MD  [facility]  [phone]    MEDS:   Scheduled Meds:  Current Facility-Administered Medications   Medication Dose Route Frequency   . sodium chloride  500 mL Intravenous Once     Continuous Infusions:   PRN Meds:.     LABS:     Results     Procedure Component Value Units Date/Time    Manual Differential [[id number]]  (Abnormal) Collected:02/27/14 1306     Segmented Neutrophils 10 % Updated:02/27/14 1416     Band Neutrophils 13 %      Lymphocytes Manual 38 %      Monocytes Manual 16 %      Eosinophils Manual 6 %      Basophils Manual 11 %      Metamyelocytes 1 %      Atypical Lymph % 5 %      Nucleated RBC 0 /100 WBC      Abs Seg Manual  0.02 (L) x10 3/uL      Bands Absolute 0.03 x10 3/uL      Absolute Lymph Manual 0.09 (L) x10 3/uL      Monocytes Absolute 0.04 x10 3/uL      Absolute Eos Manual 0.01 x10 3/uL      Absolute Baso Manual 0.03 x10 3/uL      Metamyelocytes Absolute 0.00 x10 3/uL      Atypical Lymph Absolute 0.01 (H) x10 3/uL     Cell MorpHology [[id number]]  (Abnormal) Collected:02/27/14 1306     Cell Morphology: Normal Updated:02/27/14 1416     Platelet Estimate Decreased (A)     CBC with Differential [[id number]]  (Abnormal) Collected:02/27/14 1306    Specimen Information:Blood / Blood Updated:02/27/14 1416     WBC 0.24 (LL) x10 3/uL      RBC 2.93 (L) x10 6/uL  Hgb 8.8 (L) g/dL      Hematocrit 32.9 (L) %      MCV 86.7 fL      MCH 30.0 pg      MCHC 34.6 g/dL      RDW 14 %      Platelets 84 (L) x10 3/uL      MPV 10.9 fL     Type and Screen [[id number]] Collected:02/27/14 1306    Specimen Information:Blood Updated:02/27/14 1357     ABO Rh B POS      AB Screen Gel NEG     Comprehensive Metabolic Panel (CMP) [[id number]]  (Abnormal) Collected:02/27/14 1306    Specimen Information:Blood Updated:02/27/14 1336     Glucose 114 (H) mg/dL      BUN 12 mg/dL      Creatinine 0.7 mg/dL      Sodium 323 (L) mEq/L      Potassium 3.7 mEq/L      Chloride 97 (L) mEq/L      CO2 22 mEq/L      CALCIUM 8.5 mg/dL      Protein, Total 5.7 (L) g/dL      Albumin 3.5 g/dL      AST (SGOT) 13 U/L      ALT 7 U/L      Alkaline Phosphatase 70 U/L      Bilirubin, Total 0.7 mg/dL      Globulin 2.2 g/dL      Albumin/Globulin Ratio 1.6      Anion Gap 10.0     GFR [[id number]] Collected:02/27/14 1306     EGFR >60.0 Updated:02/27/14 1336    Lipase [[id number]] Collected:02/27/14 1306    Specimen Information:Blood Updated:02/27/14 1336     Lipase 11 U/L     Protime-INR [[id number]]  (Abnormal) Collected:02/27/14 1306    Specimen Information:Blood Updated:02/27/14 1327     PT 12.4 (L) sec      PT INR 0.9      PT Anticoag. Given Within 48 hrs. None           IMAGING DATA:  Radiology  Results (24 Hour)     ** No Results found for the last 24 hours. **

## 2014-02-27 NOTE — ED Notes (Addendum)
Patient c/o feeling tired, achy, short of breath for several days. States she had syncopal episode on Tuesday and thinks she is anemic.

## 2014-02-27 NOTE — Progress Notes (Addendum)
Pt in tele bed with monitor on (SR/ST). Husband at bedside. Bed alarm activated.   Dr. Cordelia Poche aware of pts arrival.

## 2014-02-27 NOTE — Plan of Care (Signed)
Problem: Moderate/High Fall Risk Score >/=15  Goal: Patient will remain free of falls  Outcome: Progressing  Bed exit alarm activated. Standard safety precautions are in place. Continue to monitor pt closely and maintain safety/comfort.

## 2014-02-27 NOTE — ED Provider Notes (Signed)
Physician/Midlevel provider first contact with patient: 02/27/14 1257         EMERGENCY DEPARTMENT HISTORY AND PHYSICAL EXAM    Date: 02/27/2014  Patient Name: Maureen Vasquez  Attending Physician: Clovis Riley, MD      History of Presenting Illness     Chief Complaint:   Chief Complaint   Patient presents with   . Syncope   . Anemia       Historian:  Patient  Onset:   Sudden     80 y.o. female h/o pancreatitis, non-Hogdkins lymphoma, anemia without transfusions, presents for evaluation due to 2 unwitnessed syncopal episodes that occurred 2 days PTA. Associated with generalized weakness. Reports that 2 days ago she had a syncopal episode and came to on the floor. Patient then briefly had another syncopal episode afterwards and since then has been persistently generally weak. States that her weakness became worse today and decided to come in for evaluation. Of note, patient is currently undergoing chemotherapy. Denies any complaints of pain or melena.     PMD:  Mack Hook, MD  Oncologist: Chana Bode, MD    Past Medical History     Past Medical History   Diagnosis Date   . Pancreatitis    . Back pain    . Seasonal allergic rhinitis    . Cancer 08/2012     Non-hodkins lymphoma/nodules on sleen       Past Surgical History     Past Surgical History   Procedure Date   . Appendectomy      Age of 51   . Excision, squamous cell      Right hand/ 2000   . Placement, mediport        Family History     Family History   Problem Relation Age of Onset   . Heart attack Mother    . Heart disease Mother    . Parkinsonism Mother    . Hyperlipidemia Mother    . Heart disease Sister    . Heart disease Brother        Social History     History     Social History   . Marital Status: Married     Spouse Name: N/A     Number of Children: N/A   . Years of Education: N/A     Social History Main Topics   . Smoking status: Never Smoker    . Smokeless tobacco: Not on file   . Alcohol Use: 4.2 oz/week     7 Glasses of wine per week   . Drug  Use: No   . Sexually Active: Not on file     Other Topics Concern   . Not on file     Social History Narrative   . No narrative on file       Allergies     Allergies   Allergen Reactions   . Aspirin Other (See Comments)     Oral and stomach irritation if taken daily   . Codeine Nausea Only   . Salicylates        Home Medications     Home medications reviewed by ED MD at 12:58 PM     Previous Medications    ACETAMINOPHEN (TYLENOL) 325 MG TABLET    Take 650 mg by mouth every 6 (six) hours as needed. Takes 1/2 tab at a time    ALOE VERA JUICE PO    Take by mouth.    CHOLECALCIFEROL (  VITAMIN D-3 PO)    Take by mouth.      CYANOCOBALAMIN (VITAMIN B-12 PO)    Take by mouth.      HYDROCODONE-ACETAMINOPHEN (NORCO) 5-325 MG PER TABLET    Take 1 tablet by mouth every 6 (six) hours as needed for Pain.    HYDROCODONE-ACETAMINOPHEN (VICODIN) 5-300 MG TABS    Take 1-2 tablets by mouth every 6 (six) hours as needed.    HYDROMORPHONE (DILAUDID) 2 MG TABLET    Take 1 tablet (2 mg total) by mouth every 6 (six) hours as needed for Pain (As needed for pain).    MAGNESIUM PO    Take by mouth.      ONDANSETRON (ZOFRAN ODT) 4 MG DISINTEGRATING TABLET    Take 1 tablet (4 mg total) by mouth every 6 (six) hours as needed for Nausea.    PSEUDOEPHEDRINE HCL (SUDAFED PO)    Take by mouth as needed.         Review of Systems     Constitutional:  +generalized weakness  Neuro:  +syncope  All other systems reviewed and negative     Physical Exam     CONSTITUTIONAL   Vital Signs Reviewed, Tachycardic, Chronically ill appearing, Pale.   HEAD   Atraumatic, Normocephalic.  EYES   Eyes are normal to inspection, No discharge from eyes.  ENT    No pharyngeal edema.  No drooling  NECK   Normal ROM, No jugular venous distention.  RESPIRATORY CHEST   Breath sounds normal, No respiratory distress.  CARDIOVASCULAR   Rate is tachycardic, Rhythm is normal, Heart sounds normal.  ABDOMEN   Abdomen is nontender, No masses, Bowel sounds normal, No  distension.  RECTAL  Non-bloody, trace positive guaiac stool. Robin RN was the chaperone.   BACK   There is no tenderness to palpation, Normal inspection.  UPPER EXTREMITY   Inspection normal, No cyanosis.  LOWER EXTREMITY   Inspection normal, No cyanosis, No edema, No calf tenderness.  NEURO   No focal motor deficits, No focal sensory deficits.  SKIN   Skin is cool and pale.   PSYCHIATRIC   Oriented X 3. Normal affect, Normal insight, Normal concentration.    ED Medications Administered     ED Medication Orders      Start     Status Ordering Provider    02/27/14 1426   meropenem (MERREM) 500 mg in sodium chloride 0.9 % 50 mL IVPB mini-bag plus   4 times per day      Route: Intravenous  Ordered Dose: 500 mg         Last MAR action:  Given Matti Minney W    02/27/14 1300   sodium chloride 0.9 % bolus 500 mL   Once      Route: Intravenous  Ordered Dose: 500 mL         Last MAR action:  New Bag Jeremyah Jelley W                Orders Placed During This Encounter     Orders Placed This Encounter   Procedures   . Blood Culture Aerobic/Anaerobic #1   . Blood Culture Aerobic/Anaerobic #2   . Urine Culture   . Chest AP Portable   . CBC with Differential   . Comprehensive Metabolic Panel (CMP)   . Protime-INR   . GFR   . Lipase   . Manual Differential   . Cell MorpHology   . UA (Reflex to  Microscopic)   . Cardiac Monitoring   . ED Unit Sec Comm Order   . ECG 12 Lead   . Type and Screen   . Saline Lock - 1st IV   . Einar Gip ED Bed Request       Data Review     Nursing records reviewed and agree: Yes    Laboratory results reviewed by ED provider (yes/no): Yes    I, Clovis Riley, MD, personally performed the services documented.  Jinho Thereasa Parkin is scribing for me on [facility]. I reviewed and confirm the accuracy of the information in this medical record.     I, Johnny Bridge, am serving as a Neurosurgeon to document services personally performed by Clovis Riley, MD, based on the provider's statements to me.      Rendering Provider: Clovis Riley, MD    Diagnostic Study Results     Cardiac Monitor (interpreted by ED physician):  1415 - Sinus tachycardia, Rate at 110 bpm, No ectopy, No ST abnormality.     EKG (interpreted by ED physician):  1300 - Sinus tachycardia, Rate at 105 bpm, Normal axis and intervals, No acute ST or T wave abnormality, Slightly abnormal EKG.     Labs     Results     Procedure Component Value Units Date/Time    UA (Reflex to Microscopic) [[id number]] Collected:02/27/14 1456    Specimen Information:Urine Updated:02/27/14 1511     Urine Type Clean Catch      Color, UA Yellow      Clarity, UA Clear      Specific Gravity UA 1.004      Urine pH 6.5      Leukocyte Esterase, UA Negative      Nitrite, UA Negative      Protein, UR Negative      Glucose, UA Negative      Ketones UA Negative      Urobilinogen, UA Normal mg/dL      Bilirubin, UA Negative      Blood, UA Negative     Blood Culture Aerobic/Anaerobic #1 [[id number]] Collected:02/27/14 1505    Specimen Information:Blood Updated:02/27/14 1505    Narrative:    1 BLUE+1 PURPLE    Blood Culture Aerobic/Anaerobic #2 [[id number]] Collected:02/27/14 1456    Specimen Information:Blood Updated:02/27/14 1456    Narrative:    1 BLUE+1 PURPLE    Urine Culture [[id number]] Collected:02/27/14 1455    Specimen Information:Urine / Urine, Clean Catch Updated:02/27/14 1456    Manual Differential [[id number]]  (Abnormal) Collected:02/27/14 1306     Segmented Neutrophils 10 % Updated:02/27/14 1416     Band Neutrophils 13 %      Lymphocytes Manual 38 %      Monocytes Manual 16 %      Eosinophils Manual 6 %      Basophils Manual 11 %      Metamyelocytes 1 %      Atypical Lymph % 5 %      Nucleated RBC 0 /100 WBC      Abs Seg Manual 0.02 (L) x10 3/uL      Bands Absolute 0.03 x10 3/uL      Absolute Lymph Manual 0.09 (L) x10 3/uL      Monocytes Absolute 0.04 x10 3/uL      Absolute Eos Manual 0.01 x10 3/uL      Absolute Baso Manual 0.03 x10 3/uL      Metamyelocytes Absolute  0.00 x10 3/uL  Atypical Lymph Absolute 0.01 (H) x10 3/uL     Cell MorpHology [[id number]]  (Abnormal) Collected:02/27/14 1306     Cell Morphology: Normal Updated:02/27/14 1416     Platelet Estimate Decreased (A)     CBC with Differential [[id number]]  (Abnormal) Collected:02/27/14 1306    Specimen Information:Blood / Blood Updated:02/27/14 1416     WBC 0.24 (LL) x10 3/uL      RBC 2.93 (L) x10 6/uL      Hgb 8.8 (L) g/dL      Hematocrit 84.1 (L) %      MCV 86.7 fL      MCH 30.0 pg      MCHC 34.6 g/dL      RDW 14 %      Platelets 84 (L) x10 3/uL      MPV 10.9 fL     Type and Screen [[id number]] Collected:02/27/14 1306    Specimen Information:Blood Updated:02/27/14 1357     ABO Rh B POS      AB Screen Gel NEG     Comprehensive Metabolic Panel (CMP) [[id number]]  (Abnormal) Collected:02/27/14 1306    Specimen Information:Blood Updated:02/27/14 1336     Glucose 114 (H) mg/dL      BUN 12 mg/dL      Creatinine 0.7 mg/dL      Sodium 474 (L) mEq/L      Potassium 3.7 mEq/L      Chloride 97 (L) mEq/L      CO2 22 mEq/L      CALCIUM 8.5 mg/dL      Protein, Total 5.7 (L) g/dL      Albumin 3.5 g/dL      AST (SGOT) 13 U/L      ALT 7 U/L      Alkaline Phosphatase 70 U/L      Bilirubin, Total 0.7 mg/dL      Globulin 2.2 g/dL      Albumin/Globulin Ratio 1.6      Anion Gap 10.0     GFR [[id number]] Collected:02/27/14 1306     EGFR >60.0 Updated:02/27/14 1336    Lipase [[id number]] Collected:02/27/14 1306    Specimen Information:Blood Updated:02/27/14 1336     Lipase 11 U/L     Protime-INR [[id number]]  (Abnormal) Collected:02/27/14 1306    Specimen Information:Blood Updated:02/27/14 1327     PT 12.4 (L) sec      PT INR 0.9      PT Anticoag. Given Within 48 hrs. None           Radiologic Studies  Radiology Results (24 Hour)     Procedure Component Value Units Date/Time    Chest AP Portable [[id number]] Collected:02/27/14 1439    Order Status:Completed  Updated:02/27/14 1444    Narrative:    History: Syncope.    FINDINGS: Portable AP view of  the chest was obtained and compared to  prior study dated 08/19/2013. There is a normal cardiomediastinal  silhouette. The lungs are clear without focal consolidation. No  pneumothorax or pleural effusion is seen. There is a right chest wall  venous port with its tip at the cavoatrial junction.      Impression:     No active disease seen in the chest.    Georgann Housekeeper, MD   02/27/2014 2:40 PM        .    VS     Patient Vitals for the past 24 hrs:   BP Temp Pulse Resp SpO2   02/27/14 1509 128/61 mmHg - 91  18  100 %   02/27/14 1236 113/54 mmHg 97.6 F (36.4 C) 117  18  99 %       MDM and Clinical Notes     1408: D/w Dr. Kandyce Rud covering for Dr. Chana Bode (patient's oncologist) who will consult on the patient. She requests cultures and IV meropenem.     1415: D/w Dr. Cordelia Poche (hospitalist) who will admit the patient.     Diagnosis and Disposition     Clinical Impression  1. Syncope    2. Neutropenia        Disposition  ED Disposition     Admit Bed Type: Telemetry [5]  Admitting Physician: Daylene Katayama [[id number]]  Patient Class: Inpatient [101]  Isolation and Infection:: Neutropenic            Prescriptions  New Prescriptions    No medications on file           Clovis Riley, MD  02/27/14 1517

## 2014-02-28 ENCOUNTER — Inpatient Hospital Stay: Payer: Medicare Other

## 2014-02-28 ENCOUNTER — Encounter: Payer: Self-pay | Admitting: Internal Medicine

## 2014-02-28 LAB — GFR: EGFR: >60

## 2014-02-28 LAB — BASIC METABOLIC PANEL
Anion Gap: 7 (ref 5.0–15.0)
BUN: 4 mg/dL — ABNORMAL LOW (ref 7–19)
CO2: 20 mEq/L — ABNORMAL LOW (ref 22–29)
Calcium: 8.1 mg/dL (ref 7.9–10.6)
Chloride: 108 mEq/L — ABNORMAL HIGH (ref 98–107)
Creatinine: 0.6 mg/dL (ref 0.6–1.0)
Glucose: 95 mg/dL (ref 70–100)
Potassium: 3.7 mEq/L (ref 3.5–5.1)
Sodium: 135 mEq/L — ABNORMAL LOW (ref 136–145)

## 2014-02-28 LAB — CBC
Hematocrit: 22.3 % — ABNORMAL LOW (ref 37.0–47.0)
Hgb: 7.6 g/dL — ABNORMAL LOW (ref 12.0–16.0)
MCH: 29.3 pg (ref 28.0–32.0)
MCHC: 34.1 g/dL (ref 32.0–36.0)
MCV: 86.1 fL (ref 80.0–100.0)
MPV: 11 fL (ref 9.4–12.3)
Nucleated RBC: 0 /100 WBC (ref 0–1)
Platelets: 68 10*3/uL — ABNORMAL LOW (ref 140–400)
RBC: 2.59 10*6/uL — ABNORMAL LOW (ref 4.20–5.40)
RDW: 14 % (ref 12–15)
WBC: 0.38 10*3/uL — ABNORMAL LOW (ref 3.50–10.80)

## 2014-02-28 LAB — ECG 12-LEAD
Atrial Rate: 104 {beats}/min
P Axis: 76 degrees
P-R Interval: 174 ms
Q-T Interval: 326 ms
QRS Duration: 70 ms
QTC Calculation (Bezet): 428 ms
R Axis: 58 degrees
T Axis: 71 degrees
Ventricular Rate: 104 {beats}/min

## 2014-02-28 LAB — PREPARE RBC: RBC Leukoreduced: TRANSFUSED

## 2014-02-28 MED ORDER — SODIUM CHLORIDE 0.9 % IV SOLN
INTRAVENOUS | Status: DC
Start: 2014-02-28 — End: 2014-03-03

## 2014-02-28 NOTE — Plan of Care (Signed)
Problem: Hemodynamic Status: Immune Suppressed-Hematologic  Goal: Stable vital signs and fluid balance  Outcome: Progressing  VSS, occasional low grade temperature. Patient denies any pain. Patient denies dizziness or lightheadedness when getting OOB. Patient oob to restroom with steady gait. H/H 7.6/22.3-- 1 unit of prbc's currently being transfused. Patient's diet advanced, pt tolerating diet. Safety maintained. Will cont to monitor.

## 2014-02-28 NOTE — H&P (Addendum)
[facility]      Patient: Maureen Vasquez  Date: 02/27/2014   DOB: 15-Sep-1934  Admission Date: 02/27/2014   MRN: [id number]  Attending: Vista Lawman MD       Chief Complaint   Patient presents with   . Syncope   . Anemia      History Gathered From: Self, Husband at bediside    HISTORY AND PHYSICAL     Maureen Vasquez is a 80 y.o. female with a PMHx of NHL currently on chemotherapy who presented with two episodes of syncope.  Patient recently underwent her 4th round of chemotherapy on February 19, 2014.  The next day, she admits to feeling well.  Usually after her chemotherapy, she has some lethargy but subsequently improves.  Two days after her recent chemotherapy she admits to feeling extremely weak.  She tried to get up from the kitchen, but fell, lost consciousness.  She woke after an unknown amount of time attempted to get up and fell once more.  She states she was out for at least 30 minutes.  Denies any limb shaking, bowel, or urinary incontinence.  No history of seizures before.  She believes she may have hit the underside of her chin on the table counter during one of the falls.  Denies any significant pain in the area at this time.  She reveals she is at decreased by mouth intake for the past day.  Denies any loose stools.  Denies any palpitations, chest pain, nausea, or vomiting.  No history of syncope in the past.  Along with her recent chemotherapy she received a routine Neulasta injection.  Review of systems revealed an 11 pound weight loss since January 29, 2014.    Past Medical History   Diagnosis Date   . H/O acute pancreatitis 1990's   . Back pain    . Seasonal allergic rhinitis    . NHL (non-Hodgkin's lymphoma) October 2013     w/ Splenic Nodules       Past Surgical History   Procedure Date   . Appendectomy      Age of 77   . Excision, squamous cell      Right hand/ 2000   . Placement, mediport        Prior to Admission medications    Medication Sig Start Date End Date Taking? Authorizing  Provider   acetaminophen (TYLENOL) 325 MG tablet Take 650 mg by mouth every 6 (six) hours as needed. Takes 1/2 tab at a time    [provider]   ALOE VERA JUICE PO Take by mouth.    [provider]   Cholecalciferol (VITAMIN D-3 PO) Take by mouth.      [provider]   Cyanocobalamin (VITAMIN B-12 PO) Take by mouth.      [provider]   HYDROcodone-acetaminophen (NORCO) 5-325 MG per tablet Take 1 tablet by mouth every 6 (six) hours as needed for Pain. 08/19/13   Clovis Riley, MD   Hydrocodone-Acetaminophen (VICODIN) 5-300 MG TABS Take 1-2 tablets by mouth every 6 (six) hours as needed. 01/18/13   Chase Picket, MD   HYDROmorphone (DILAUDID) 2 MG tablet Take 1 tablet (2 mg total) by mouth every 6 (six) hours as needed for Pain (As needed for pain). 12/23/13   Pamala Hurry, MD   MAGNESIUM PO Take by mouth.      [provider]   ondansetron (ZOFRAN ODT) 4 MG disintegrating tablet Take  1 tablet (4 mg total) by mouth every 6 (six) hours as needed for Nausea. 08/19/13   Clovis Riley, MD   predniSONE (DELTASONE) 20 MG tablet Take 20 mg by mouth daily. Pt unsure about dose    [provider]   Pseudoephedrine HCl (SUDAFED PO) Take by mouth as needed.      [provider]       Allergies   Allergen Reactions   . Aspirin Other (See Comments)     Oral and stomach irritation if taken daily   . Codeine Nausea Only   . Salicylates        CODE STATUS: full code    PRIMARY CARE MD: Mack Hook, MD    Family History   Problem Relation Age of Onset   . Heart attack Mother    . Heart disease Mother    . Parkinsonism Mother    . Hyperlipidemia Mother    . Heart disease Sister    . Heart disease Brother        History   Substance Use Topics   . Smoking status: Never Smoker    . Smokeless tobacco: Not on file   . Alcohol Use: 4.2 oz/week     7 Glasses of wine per week       REVIEW OF SYSTEMS     Ten point review of systems negative or as per HPI and below  endorsements.    PHYSICAL EXAM     Vital Signs (most recent): BP 132/64  Pulse 127  Temp 99.7 F (37.6 C) (Oral)  Resp 19  Ht 1.626 m (5\' 4" )  Wt 53.071 kg (117 lb)  BMI 20.07 kg/m2  SpO2 97%  Constitutional: No apparent distress., thin, emaciated Patient speaks freely in full sentences.   HEENT: NC/AT, PERRL, no scleral icterus or conjunctival pallor, no nasal discharge, MMM, oropharynx without erythema or exudate  Neck: trachea midline, supple, no cervical or supraclavicular lymphadenopathy or masses  Cardiovascular: RRR, normal S1 S2, no murmurs, gallops, palpable thrills, no JVD, Non-displaced PMI.  Respiratory: Normal rate. No retractions or increased work of breathing. Clear to auscultation and percussion bilaterally.  Gastrointestinal: +BS, non-distended, soft, non-tender, no rebound or guarding, no hepatosplenomegaly  Genitourinary: no suprapubic or costovertebral angle tenderness  Musculoskeletal: right upper chest MediPort intact,   ROM and motor strength grossly normal. No clubbing, edema, or cyanosis. DP and radial pulses 2+ and symmetric.  Skin: no rashes, jaundice or other lesions  Neurologic: EOMI, CN 2-12 grossly intact. no gross motor or sensory deficits, patellar and bicep DTR 2+ and symmetric, downward plantar reflexes  Psychiatric: AAOx3, affect and mood appropriate. The patient is alert, interactive, appropriate.    LABS & IMAGING     Recent Results (from the past 24 hour(s))   CBC AND DIFFERENTIAL    Collection Time    02/27/14  1:06 PM       Component Value Range    WBC 0.24 (*) 3.50 - 10.80 x10 3/uL    RBC 2.93 (*) 4.20 - 5.40 x10 6/uL    Hgb 8.8 (*) 12.0 - 16.0 g/dL    Hematocrit 40.9 (*) 37.0 - 47.0 %    MCV 86.7  80.0 - 100.0 fL    MCH 30.0  28.0 - 32.0 pg    MCHC 34.6  32.0 - 36.0 g/dL    RDW 14  12 - 15 %    Platelets 84 (*) 140 - 400 x10 3/uL  MPV 10.9  9.4 - 12.3 fL   COMPREHENSIVE METABOLIC PANEL    Collection Time    02/27/14  1:06 PM       Component Value Range    Glucose  114 (*) 70 - 100 mg/dL    BUN 12  7 - 19 mg/dL    Creatinine 0.7  0.6 - 1.0 mg/dL    Sodium 540 (*) 981 - 145 mEq/L    Potassium 3.7  3.5 - 5.1 mEq/L    Chloride 97 (*) 98 - 107 mEq/L    CO2 22  22 - 29 mEq/L    CALCIUM 8.5  7.9 - 10.6 mg/dL    Protein, Total 5.7 (*) 6.0 - 8.3 g/dL    Albumin 3.5  3.5 - 5.0 g/dL    AST (SGOT) 13  5 - 34 U/L    ALT 7  0 - 55 U/L    Alkaline Phosphatase 70  40 - 150 U/L    Bilirubin, Total 0.7  0.2 - 1.2 mg/dL    Globulin 2.2  2.0 - 3.6 g/dL    Albumin/Globulin Ratio 1.6  0.9 - 2.2    Anion Gap 10.0  5.0 - 15.0   PT/INR    Collection Time    02/27/14  1:06 PM       Component Value Range    PT 12.4 (*) 12.6 - 15.0 sec    PT INR 0.9  0.9 - 1.1    PT Anticoag. Given Within 48 hrs. None     TYPE AND SCREEN    Collection Time    02/27/14  1:06 PM       Component Value Range    ABO Rh B POS      AB Screen Gel NEG     GFR    Collection Time    02/27/14  1:06 PM       Component Value Range    EGFR >60.0     LIPASE    Collection Time    02/27/14  1:06 PM       Component Value Range    Lipase 11  8 - 78 U/L   MANUAL DIFFERENTIAL    Collection Time    02/27/14  1:06 PM       Component Value Range    Segmented Neutrophils 10  None %    Band Neutrophils 13  None %    Lymphocytes Manual 38  None %    Monocytes Manual 16  None %    Eosinophils Manual 6  None %    Basophils Manual 11  None %    Metamyelocytes 1  None %    Atypical Lymph % 5  None %    Nucleated RBC 0  0 - 1 /100 WBC    Abs Seg Manual 0.02 (*) 1.80 - 8.10 x10 3/uL    Bands Absolute 0.03  0.00 - 1.00 x10 3/uL    Absolute Lymph Manual 0.09 (*) 0.50 - 4.40 x10 3/uL    Monocytes Absolute 0.04  0.00 - 1.20 x10 3/uL    Absolute Eos Manual 0.01  0.00 - 0.70 x10 3/uL    Absolute Baso Manual 0.03  0.00 - 0.20 x10 3/uL    Metamyelocytes Absolute 0.00  0 x10 3/uL    Atypical Lymph Absolute 0.01 (*) 0 x10 3/uL   CELL MORPHOLOGY    Collection Time    02/27/14  1:06 PM  Component Value Range    Cell Morphology: Normal      Platelet Estimate  Decreased (*)    MAGNESIUM    Collection Time    02/27/14  1:06 PM       Component Value Range    Magnesium 1.8  1.6 - 2.6 mg/dL   URINALYSIS, REFLEX TO MICROSCOPIC EXAM IF INDICATED    Collection Time    02/27/14  2:56 PM       Component Value Range    Urine Type Clean Catch      Color, UA Yellow  Clear - Yellow    Clarity, UA Clear  Clear - Hazy    Specific Gravity UA 1.004  1.001-1.035    Urine pH 6.5  5.0-8.0    Leukocyte Esterase, UA Negative  Negative    Nitrite, UA Negative  Negative    Protein, UR Negative  Negative    Glucose, UA Negative  Negative    Ketones UA Negative  Negative    Urobilinogen, UA Normal  0.2  - 2.0 mg/dL    Bilirubin, UA Negative  Negative    Blood, UA Negative  Negative   PREALBUMIN    Collection Time    02/27/14  8:53 PM       Component Value Range    Prealbumin 16.0  16.0 - 38.0 mg/dL       MICROBIOLOGY:  Blood Culture: done  Urine Culture: done  Antibiotics Started: Meropenem    IMAGING:  XR CHEST AP PORTABLE    Final Result:  No active disease seen in the chest.         Georgann Housekeeper, MD     02/27/2014 2:40 PM           CARDIAC:  EKG Interpretation:  Sinus tachycardia    EMERGENCY DEPARTMENT COURSE:  Orders Placed This Encounter   Procedures   . Blood Culture Aerobic/Anaerobic #1   . Blood Culture Aerobic/Anaerobic #2   . Urine Culture   . Chest AP Portable   . CBC with Differential   . Comprehensive Metabolic Panel (CMP)   . Protime-INR   . GFR   . Lipase   . Manual Differential   . Cell MorpHology   . UA (Reflex to Microscopic)   . Basic Metabolic Panel   . CBC   . Magnesium   . Prealbumin   . GFR   . Diet clear liquid   . Cardiac Monitoring   . ED Holding Orders Expire in 8 Hours   . Notify Admitting Attending ( Change in Condition)   . Notify Attending of Patient Arrival to Floor within 8 Hours   . Notify Physician (Vital Signs)   . Notify Physician (Lab Results)   . Vital Signs Q4HR   . Vital signs   . Pain Assessment   . Up as tolerated   . Increase  Activity as tolerated   .  I/O   . Height   . Weight   . Skin assessment   . Bladder scan   . Nasal Cannula Low-Flow (5 lpm or Less)   . Intermittent Pneumatic Compression   . Education: Activity   . Education: Disease Process & Condition   . Education: Pain Management   . Education: Falls Risk   . Telemetry monitoring   . Full Code   . ED Unit Sec Comm Order   . ECG 12 Lead   . Electrocardiogram, 12-lead   . Type and Screen   .  Saline Lock - 1st IV   . Admit to Inpatient   . Einar Gip ED Bed Request   . Neutropenic Precautions   . Neutropenic precautions       ASSESSMENT & PLAN     KAILAN CARMEN is a 80 y.o. female admitted under INPATIENT with2 episodes of syncope, likely related to chemotherapy induced fatigue.    1.  Syncope  Doubt cardiac etiology.  Most likely due to general deconditioning from recent chemotherapy, neutropenia, and anemia   Cardiac monitoring.   Normal saline at 125 mL an hour.   PT, OT.   Nutrition consult    2. Neutropenia  due to high infectious risk, will start antibiotics and workup for possible sources.  Clinically does not appear infectious at all.  Await recent Neulasta injection to improve white count prior to discharge   Repeat complete blood count in a.m.   Meropenem 500 mg IV every 6.   Await blood cultures    3. Non-Hodgkin's lymphoma status post chemotherapy  Oncology aware of admission and following.  Appreciate recommendations.  Plan for decreased doses of chemotherapy on next subsequent rounds.  As per patient, recent PET scan revealed no splenic involvement.   As per oncology    4. Anemia of chronic disease  Doubt acute blood loss.  Likely due to bone marrow suppression from ongoing chemotherapy.  Consider transfusion if hemoglobin levels drop below 8   Repeat complete blood count in a.m.   Transfuse if less than 8    5. GI Prophylaxis  not indicated    6. Nutrition  Regular    7. DVT/VTE Prophylaxis  SCD's & Lovenox 40 mg subQ daily    Anticipated medical stability for discharge: April, 18  - Afternoon    Service status/Reason for ongoing hospitalizatio: Inpatient: risk of morbidity and mortality and risk of progressive disease  Anticipated Discharge Needs: None    Signed,  Vista Lawman, MD  02/28/2014 1:19 AM

## 2014-02-28 NOTE — Progress Notes (Signed)
[facility]  HOSPITALIST  PROGRESS NOTE      Patient: Maureen Vasquez  Date: 02/28/2014   LOS: 1 Days  Admission Date: 02/27/2014   MRN: [id number]  Attending: Lamar Benes MD     ASSESSMENT/PLAN     ETOY MCDONNELL is a 80 y.o. female history of non-Hodgkin's lymphoma, currently on chemotherapy, presenting with 2 episodes of syncope.    1.  Recurrent syncope - likely 2/2 to symptomatic anemia, dehydration and recent chemotherapy  - continue to monitor on telemetry   - TTE shows normal LV size and function, aortic sclerosis without stenosis, and mild mitral regurgitation  - Transfuse 1 unit of packed red blood cells for hemoglobin less than 8 and symptomatic anemia  - Maintain active type and screen  - Recheck complete blood count in a.m.  - gentle IVF - NS at 75 ml/hr     2.  Generalized weakness - likely due to recent recent chemotherapy and dehydration  - PT/OT evaluation    3.  Non-Hodgkin's lymphoma on chemotherapy - recently received C4 of are R-chop 8 days ago  - Appreciate oncology consult Dr. Kandyce Rud   - may need to reduced dose of future chemotherapy given the patient was unable to tolerate this round of chemotherapy  -  Dilaudid for pain control     4.  Pancytopenia secondary to recent chemotherapy, neutropenia  - empirically on meropenem  - f/u blood cultures NGTD, urine culture no growth   - appreciate Oncology Dr. Kandyce Rud   - S/P Neupogen outpatient    5. Hyponatremia with dehydration   - trial of gentle IVF NS at 75 ml/hr   - check urine lytes - urine sodium, urine cr, urine osms, serum osms     DVT PPX: lovenox   Diet: neutrapenic diet   Code: Full code     SUBJECTIVE     Patient is still feeling very weak and fatigued.  She was able to tolerate a bit of her breakfast this morning.  She denies chest pain or dizziness or shortness of breath currently    MEDICATIONS     Current Facility-Administered Medications   Medication Dose Route Frequency   . enoxaparin  40 mg Subcutaneous Daily at 1800   . meropenem   500 mg Intravenous Q6H SCH       ROS     Remainder of 10 point ROS as above or otherwise negative    PHYSICAL EXAM     Filed Vitals:    02/28/14 2051   BP: 137/73   Pulse: 104   Temp:    Resp: 20   SpO2: 98%       Temperature: Temp  Min: 95.4 F (35.2 C)  Max: 99.9 F (37.7 C)  Pulse: Pulse  Min: 95   Max: 127   Respiratory: Resp  Min: 16   Max: 20   Non-Invasive BP: BP  Min: 112/46  Max: 140/59  Pulse Oximetry SpO2  Min: 97 %  Max: 99 %    Intake and Output Summary (Last 24 hours) at Date Time  No intake or output data in the 24 hours ending 02/28/14 2153    GEN APPEARANCE: Normal; pale, frail appearing female   HEENT: PERLA; EOMI; Conjunctiva Clear  NECK: Supple; No bruits  CVS: RRR, S1, S2; No M/G/R  LUNGS: CTAB; No Wheezes; No Rhonchi: No rales  ABD: Soft; No TTP; + Normoactive BS  EXT: No edema; Pulses 2+ and intact  SKIN:  No rash or Lesions  NEURO: CN 2-12 intact; No Focal neurological deficits      LABS       Lab 02/28/14 0635 02/27/14 1306   WBC 0.38* 0.24*   RBC 2.59* 2.93*   HGB 7.6* 8.8*   HCT 22.3* 25.4*   MCV 86.1 86.7   PLT 68* 84*         Lab 02/28/14 0635 02/27/14 1306   NA 135* 129*   K 3.7 3.7   CL 108* 97*   CO2 20* 22   BUN 4* 12   CREAT 0.6 0.7   GLU 95 114*   CA 8.1 8.5   MG -- 1.8         Lab 02/27/14 1306   ALT 7   AST 13   GGT --   BILITOTAL 0.7   BILIDIRECT --   ALB 3.5   ALKPHOS 70       No results found for this basename: CK:3,CKMB:3,CKMBINDEX:3,TROPI:3 in the last 168 hours      Lab 02/27/14 1306   INR 0.9   PT 12.4*   PTT --         RADIOLOGY     Radiological Procedure reviewed.    XR CHEST AP PORTABLE    Final Result:  No active disease seen in the chest.         Georgann Housekeeper, MD     02/27/2014 2:40 PM       ECHOCARDIOGRAM ADULT COMPLETE W CLR/ DOPP WAVEFORM    Final Result:       Normal LV size and function.        Aortic sclerosis.        Mild mitral regurgitation        Jodelle Gross, MD     02/28/2014 3:43 PM           Signed,  Lamar Benes MD  9:53 PM 02/28/2014

## 2014-02-28 NOTE — PT Progress Note (Signed)
[facility]  Physical Therapy Attempt Note    Patient:  Maureen Vasquez MRN#:  [id number]  Unit:  [facility] Room/Bed: Z308/M578-46      Patient not seen for physical therapy secondary to low H&H today.    Lab Results   Component Value Date    WBC 0.38* 02/28/2014    HGB 7.6* 02/28/2014    HCT 22.3* 02/28/2014    MCV 86.1 02/28/2014    PLT 68* 02/28/2014     Will attempt when patient is more appropriate.    Elvera Maria, PT, DPT  02/28/2014  9:07 AM

## 2014-03-01 LAB — BASIC METABOLIC PANEL
Anion Gap: 9 (ref 5.0–15.0)
BUN: 4 mg/dL — ABNORMAL LOW (ref 7–19)
CO2: 20 mEq/L — ABNORMAL LOW (ref 22–29)
Calcium: 7.8 mg/dL — ABNORMAL LOW (ref 7.9–10.6)
Chloride: 105 mEq/L (ref 98–107)
Creatinine: 0.5 mg/dL — ABNORMAL LOW (ref 0.6–1.0)
Glucose: 89 mg/dL (ref 70–100)
Potassium: 3.4 mEq/L — ABNORMAL LOW (ref 3.5–5.1)
Sodium: 134 mEq/L — ABNORMAL LOW (ref 136–145)

## 2014-03-01 LAB — CBC
Hematocrit: 25.2 % — ABNORMAL LOW (ref 37.0–47.0)
Hgb: 8.8 g/dL — ABNORMAL LOW (ref 12.0–16.0)
MCH: 29.8 pg (ref 28.0–32.0)
MCHC: 34.9 g/dL (ref 32.0–36.0)
MCV: 85.4 fL (ref 80.0–100.0)
MPV: 11.1 fL (ref 9.4–12.3)
Nucleated RBC: 0 /100 WBC (ref 0–1)
Platelets: 72 10*3/uL — ABNORMAL LOW (ref 140–400)
RBC: 2.95 10*6/uL — ABNORMAL LOW (ref 4.20–5.40)
RDW: 14 % (ref 12–15)
WBC: 1.66 10*3/uL — ABNORMAL LOW (ref 3.50–10.80)

## 2014-03-01 LAB — GFR: EGFR: >60

## 2014-03-01 LAB — TROPONIN I
Troponin I: 0.1 ng/mL — ABNORMAL HIGH (ref 0.00–0.09)
Troponin I: 0.12 ng/mL — ABNORMAL HIGH (ref 0.00–0.09)

## 2014-03-01 MED ORDER — SODIUM CHLORIDE 0.9 % IV SOLN
1.0000 g | Freq: Once | INTRAVENOUS | Status: AC
Start: 2014-03-01 — End: 2014-03-01
  Administered 2014-03-01: 1 g via INTRAVENOUS
  Filled 2014-03-01: qty 10

## 2014-03-01 MED ORDER — ALUM & MAG HYDROXIDE-SIMETH 200-200-20 MG/5ML PO SUSP
30.0000 mL | ORAL | Status: DC | PRN
Start: 2014-03-01 — End: 2014-03-05

## 2014-03-01 MED ORDER — POTASSIUM CHLORIDE CRYS ER 20 MEQ PO TBCR
40.0000 meq | EXTENDED_RELEASE_TABLET | ORAL | Status: AC
Start: 2014-03-01 — End: 2014-03-01
  Administered 2014-03-01 (×2): 40 meq via ORAL
  Filled 2014-03-01 (×2): qty 2

## 2014-03-01 NOTE — PT Eval Note (Addendum)
[facility]  Physical Therapy Evaluation    Patient: Maureen Vasquez MRN: [id number]   Unit: [facility]    Bed: V409/W119-14    Assessment:   Patient is a 80 year old female currently undergoing chemo for NHL who presented to the ER s/p syncope.  Patient was found to be anemic and was transfused on 02/28/14.  Limited PT eval secondary to PT alerted that patient's HR 140's with activity.    Assessment: Decreased LE strength;Decreased endurance/activity tolerance;Gait impairment, resulting in difficulty with safe functional independence.  Prognosis: Good    Interdisciplinary Communication:   Communicated with RN to clear patient for eval and to give an update as tele monitor alerted PT that patient's HR was in the 140's with activity.    OT in to assist with patient in the bathroom.      Plan:   Recommendation  Discharge Recommendation: Home with supervision;Home with home health PT (may benefit from HPT pending progress)  DME Recommended for Discharge:  (TBD with further gait training)  PT - Next Visit Recommendation: 03/03/14  PT Frequency: 4-5x/wk  Patient Goal:  (to go home)   Risks/Benefits/POC Discussed with Pt/Family: With patient   Treatment/Interventions: Gait training;Functional transfer training;LE strengthening/ROM;Endurance training;Equipment eval/education     Goals  Goal Formulation: With patient  Time for Goal Acheivement: 3 visits  Goals: Select goal  Pt Will Perform Sit to Stand: Independently;to maximize functional mobility and independence  Pt Will Ambulate: 101-150 feet;to maximize functional mobility and independence (with least restrictive AD)  Pt Will Go Up / Down Stairs: 3-5 stairs;to maximize functional mobility and independence;With stand by assist (1 rail)    Education:   Educated patient to role of physical therapy, plan of care, transfer training techniques, gait training techniques pushing IV pole, home safety, next appropriate level of care.    Patient/caregiver demonstrated  good understanding.  Will benefit from further gait training.  Discussed goals of therapy with patient, in agreement with plan.    Evaluation:   Consult received for [name] for PT evaluation and treatment.  Chart reviewed.  Patient's medical condition is appropriate for Physical Therapy intervention at this time.     Medical Diagnosis: Syncope [780.2]  Neutropenia [288.00]  Syncope and collapse    Therapy Diagnosis: difficulty walking, generalized muscle weakness    Precautions  Weight Bearing Status: no restrictions  Other Precautions:  (falls, neutropenic)    History of Present Illness: Maureen Vasquez is a 80 y.o. female admitted on 02/27/2014 s/p syncopal episode.  Patient is  currently undergoing chemo for NHL. Patient was found to be anemic and was transfused on 02/28/14.    Patient Active Problem List   Diagnosis   . Low back ache   . Neutropenia   . Syncope and collapse   . NHL (non-Hodgkin's lymphoma)   . Generalized weakness     Past Medical History   Diagnosis Date   . H/O acute pancreatitis 1990's   . Back pain    . Seasonal allergic rhinitis    . NHL (non-Hodgkin's lymphoma) October 2013     w/ Splenic Nodules     Past Surgical History   Procedure Date   . Appendectomy      Age of 36   . Excision, squamous cell      Right hand/ 2000   . Placement, mediport        X-Rays/Tests/Labs:  H&H 8.8/25.5 today    HR at  rest 101 bpm, with transfer and ambulation to the bathroom 140's (alerted by tele tech)    Prior Level of Function  Prior level of function: Independent with ADLs;Ambulates independently (more difficulty taking care of herself on chemo weeks)  Home Living Arrangements  Living Arrangements: Spouse/significant other  Type of Home: House  Home Layout: Stairs to enter with rails (add number in comment);Able to live on main level with bedroom/bathroom  Bathroom Shower/Tub: Tub only  Bathroom Toilet: Standard    Subjective: Patient is agreeable to participation in the therapy session. Nursing  clears patient for therapy.   Pain Assessment  Pain Assessment: No/denies pain          Objective:  Patient is in bed with peripheral IV and tele in place.    Observation of patient/vitals  Inspection/Posture  Inspection/Posture:  (supine, frail, peripheral IV)  Cognition  Arousal/Alertness: Appropriate responses to stimuli  Following Commands: Follows all commands and directions without difficulty  Insights: Fully aware of deficits  Neuro Status  Behavior: calm;cooperative    Musculoskeletal Examination                                     Sensation: not formally tested                     Functional Mobility  Rolling: Independent  Supine to Sit: Independent  Scooting to EOB: Independent  Sit to Stand: Stand by assistance    Transfers  Bed to Chair: Stand by assistance (to commode pushing IV pole)  Balance  Balance: within functional limits    Locomotion  Ambulation: modified independent (device) (pushing IV pole)  Pattern: decreased cadence;decreased step length  Stair Management: not attempted (patient's HR 140's, asymptomatic)    Participation and Endurance  Participation Effort: excellent  Endurance: Tolerates < 10 min exercise with changes in vital signs         G codes  Based on AM-PAC Current:         Goal:         Discharge:    Time Calculation  PT Received On: 03/01/14  Start Time: 0945  Stop Time: 1000  Time Calculation (min): 15 min    Signature: Mardi Mainland, PT,DPT

## 2014-03-01 NOTE — Plan of Care (Signed)
Problem: Infection/Potential for Infection  Goal: Free from infection  Outcome: Progressing  Adhering to infection control pt on neutropenic precautions. Wbc 1.66 today

## 2014-03-01 NOTE — OT Eval Note (Signed)
[facility]   Occupational Therapy Evaluation     Patient: Maureen Vasquez    MRN#: [id number]   Unit: [facility]  Bed: V409/W119-14    Time of treatment: Time Calculation  OT Received On: 03/01/14  Start Time: 1000  Stop Time: 1025  Time Calculation (min): 25 min    Consult received for [name] for OT Evaluation and Treatment.  Patient's medical condition is appropriate for Occupational therapy intervention at this time.    Assessment:   Pt is Ind. No con't OT needs.   Discharge from therapy.    Interdisciplinary Communication   Patient is in bed and call bell/bed alarm set within reach.  Spoke with RN regarding results of evaluation.    Plan:   Discharge from Occupational Therapy.    Education:   Ed pt on role of OT and that they have no further needs.  Pt in agreement with discharge.    Evaluation:     Precautions and Contraindications:   Precautions  Weight Bearing Status: no restrictions  Other Precautions:  (falls, neutropenic)    Medical Diagnosis: Syncope [780.2]  Neutropenia [288.00]  Syncope and collapse    History of Present Illness: Maureen Vasquez is a 80 y.o. female admitted on 02/27/2014 with syncopal episode.      Patient Active Problem List   Diagnosis   . Low back ache   . Neutropenia   . Syncope and collapse   . NHL (non-Hodgkin's lymphoma)   . Generalized weakness        Past Medical/Surgical History:  Past Medical History   Diagnosis Date   . H/O acute pancreatitis 1990's   . Back pain    . Seasonal allergic rhinitis    . NHL (non-Hodgkin's lymphoma) October 2013     w/ Splenic Nodules      Past Surgical History   Procedure Date   . Appendectomy      Age of 50   . Excision, squamous cell      Right hand/ 2000   . Placement, mediport          Social History/Prior level of function:  Prior Level of Function  Prior level of function: Independent with ADLs;Ambulates independently (more difficulty taking care of herself on chemo weeks)  Home Living Arrangements  Living Arrangements:  Spouse/significant other  Type of Home: House  Home Layout: Stairs to enter with rails (add number in comment);Able to live on main level with bedroom/bathroom  Bathroom Shower/Tub: Tub only  Bathroom Toilet: Standard    Orientation/Cognition: A&O x 4     Pain: 0/10    Gross UE ROM: WFL    Gross UE strength: WFL    ADLs: Ind dsg, toileting, grooming at sink.  Pt does fatigue quickly, and HR increases w/ activity.    Functional mobility: Ind dynamic balance, BR transfers    Treatment: Pt w/ decreased endurance.  Ed pt on EC/WS for home.  Recommended shower chair and how to get. (10 min)    Signature: Kern Alberta, OT

## 2014-03-01 NOTE — Progress Notes (Signed)
[facility]  HOSPITALIST  PROGRESS NOTE      Patient: Maureen Vasquez  Date: 03/01/2014   LOS: 2 Days  Admission Date: 02/27/2014   MRN: [id number]  Attending: Lamar Benes MD     ASSESSMENT/PLAN     Maureen Vasquez is a 80 y.o. female history of non-Hodgkin's lymphoma, currently on chemotherapy, presenting with 2 episodes of syncope.    1.  Recurrent syncope - likely 2/2 to symptomatic anemia, dehydration and recent chemotherapy  - continue to monitor on telemetry   - TTE shows normal LV size and function, aortic sclerosis without stenosis, and mild mitral regurgitation  - s/p transfusion of 1 unit of packed red blood cells for hemoglobin less than 8 and symptomatic anemia. Hgb responded appropriately. Patient feeling slightly better  - Maintain active type and screen  - Recheck complete blood count in a.m.  - gentle IVF - NS at 75 ml/hr     2.  Generalized weakness - likely due to recent recent chemotherapy and dehydration  - PT recommends home with supervision, home health PT, pending re-evaluation (TBD)    3.  Non-Hodgkin's lymphoma on chemotherapy - recently received C4 of are R-chop 8 days ago  - Appreciate oncology consult Dr. Kandyce Rud   - may need to reduced dose of future chemotherapy given the patient was unable to tolerate this round of chemotherapy  -  Dilaudid for pain control     4.  Pancytopenia secondary to recent chemotherapy, neutropenia - improving slightly   - empirically on meropenem  - f/u blood cultures NGTD, urine culture no growth   - appreciate Oncology Dr. Kandyce Rud   - S/P Neupogen outpatient    5. Hyponatremia with dehydration - Na is stable around 135 - 134  - trial of gentle IVF NS at 75 ml/hr   - check urine lytes - urine sodium, urine cr, urine osms, serum osms     6. Tachycardia - to 134 --> 110s. EKG shows sinus tachycardia. Reviewed tele with tech. TTE shows normal LV function. Will continue with IVF. May need further workup if persists    7. Hypokalemia - replete as needed, hypocalcemia  - calcium given  8. Pt complaining of left chest discomfort - will check serial troponins and ekg     DVT PPX: lovenox   Diet: neutrapenic diet   Code: Full code   Dispo: pending medical improvement, improvement in tachycardia and re-evaluation by PT     SUBJECTIVE     Patient is still feeling very weak and fatigued. Did have some chest discomfort this am under her left breast, resolved. Will check EKG and troponins. Still with some DOE but improving.    MEDICATIONS     Current Facility-Administered Medications   Medication Dose Route Frequency   . enoxaparin  40 mg Subcutaneous Daily at 1800   . meropenem  500 mg Intravenous Q6H SCH       ROS     Remainder of 10 point ROS as above or otherwise negative    PHYSICAL EXAM     Filed Vitals:    03/01/14 0327   BP: 142/65   Pulse: 134   Temp: 97.3 F (36.3 C)   Resp: 20   SpO2: 93%       Temperature: Temp  Min: 95.4 F (35.2 C)  Max: 99.9 F (37.7 C)  Pulse: Pulse  Min: 95   Max: 134   Respiratory: Resp  Min: 16   Max: 20  Non-Invasive BP: BP  Min: 137/73  Max: 155/77  Pulse Oximetry SpO2  Min: 93 %  Max: 99 %    Intake and Output Summary (Last 24 hours) at Date Time  No intake or output data in the 24 hours ending 03/01/14 0802    GEN APPEARANCE: Normal; pale, frail appearing female   HEENT: PERLA; EOMI; Conjunctiva Clear  NECK: Supple; No bruits  CVS: RRR, S1, S2; No M/G/R  LUNGS: CTAB; No Wheezes; No Rhonchi: No rales  ABD: Soft; No TTP; + Normoactive BS  EXT: No edema; Pulses 2+ and intact  SKIN: No rash or Lesions  NEURO: CN 2-12 intact; No Focal neurological deficits      LABS       Lab 03/01/14 0527 02/28/14 0635 02/27/14 1306   WBC 1.66* 0.38* 0.24*   RBC 2.95* 2.59* 2.93*   HGB 8.8* 7.6* 8.8*   HCT 25.2* 22.3* 25.4*   MCV 85.4 86.1 86.7   PLT 72* 68* 84*         Lab 03/01/14 0527 02/28/14 0635 02/27/14 1306   NA 134* 135* 129*   K 3.4* 3.7 3.7   CL 105 108* 97*   CO2 20* 20* 22   BUN 4* 4* 12   CREAT 0.5* 0.6 0.7   GLU 89 95 114*   CA 7.8* 8.1 8.5   MG -- --  1.8         Lab 02/27/14 1306   ALT 7   AST 13   GGT --   BILITOTAL 0.7   BILIDIRECT --   ALB 3.5   ALKPHOS 70       No results found for this basename: CK:3,CKMB:3,CKMBINDEX:3,TROPI:3 in the last 168 hours      Lab 02/27/14 1306   INR 0.9   PT 12.4*   PTT --         RADIOLOGY     Radiological Procedure reviewed.    XR CHEST AP PORTABLE    Final Result:  No active disease seen in the chest.         Georgann Housekeeper, MD     02/27/2014 2:40 PM       ECHOCARDIOGRAM ADULT COMPLETE W CLR/ DOPP WAVEFORM    Final Result:       Normal LV size and function.        Aortic sclerosis.        Mild mitral regurgitation        Jodelle Gross, MD     02/28/2014 3:43 PM           Signed,  Lamar Benes MD  8:02 AM 03/01/2014

## 2014-03-01 NOTE — Plan of Care (Signed)
Problem: Moderate/High Fall Risk Score >/=15  Goal: Patient will remain free of falls  Outcome: Progressing  Enc to call for assist oob to  Bathroom.  Call button within reach.  Continue to monitor for needs

## 2014-03-02 LAB — BASIC METABOLIC PANEL
Anion Gap: 5 (ref 5.0–15.0)
BUN: 3 mg/dL — ABNORMAL LOW (ref 7–19)
CO2: 23 mEq/L (ref 22–29)
Calcium: 8.1 mg/dL (ref 7.9–10.6)
Chloride: 105 mEq/L (ref 98–107)
Creatinine: 0.6 mg/dL (ref 0.6–1.0)
Glucose: 90 mg/dL (ref 70–100)
Potassium: 3.8 mEq/L (ref 3.5–5.1)
Sodium: 133 mEq/L — ABNORMAL LOW (ref 136–145)

## 2014-03-02 LAB — CBC
Hematocrit: 26.1 % — ABNORMAL LOW (ref 37.0–47.0)
Hgb: 9.1 g/dL — ABNORMAL LOW (ref 12.0–16.0)
MCH: 29.9 pg (ref 28.0–32.0)
MCHC: 34.9 g/dL (ref 32.0–36.0)
MCV: 85.9 fL (ref 80.0–100.0)
MPV: 11.5 fL (ref 9.4–12.3)
Nucleated RBC: 0 /100 WBC (ref 0–1)
Platelets: 111 10*3/uL — ABNORMAL LOW (ref 140–400)
RBC: 3.04 10*6/uL — ABNORMAL LOW (ref 4.20–5.40)
RDW: 15 % (ref 12–15)
WBC: 4.31 10*3/uL (ref 3.50–10.80)

## 2014-03-02 LAB — ECG 12-LEAD
Atrial Rate: 105 {beats}/min
P Axis: 64 degrees
P-R Interval: 182 ms
Q-T Interval: 338 ms
QRS Duration: 74 ms
QTC Calculation (Bezet): 446 ms
R Axis: 31 degrees
T Axis: 46 degrees
Ventricular Rate: 105 {beats}/min

## 2014-03-02 LAB — TROPONIN I: Troponin I: 0.12 ng/mL — ABNORMAL HIGH (ref 0.00–0.09)

## 2014-03-02 LAB — T4, FREE: T4 Free: 0.96 ng/dL (ref 0.70–1.48)

## 2014-03-02 LAB — TSH: TSH: 2.57 u[IU]/mL (ref 0.35–4.94)

## 2014-03-02 LAB — GFR: EGFR: >60

## 2014-03-02 NOTE — Progress Notes (Signed)
PROGRESS NOTE    Date Time: 03/02/2014 10:57 AM  Patient Name: Maureen Vasquez, Maureen Vasquez    Assessment/ Plan:   KATHI DOHN is a 80 y.o. female with Hx of non-Hodgkin's lymphoma, currently on chemotherapy, presenting with 2 episodes of syncope.    1. Recurrent syncope - likely 2/2 to symptomatic anemia, dehydration and recent chemotherapy   - continue to monitor on telemetry   - TTE shows normal LV size and function, aortic sclerosis without stenosis, and mild mitral regurgitation   - Transfuse 1 unit of packed red blood cells for hemoglobin less than 8 and symptomatic anemia   - Maintain active type and screen    - gentle IVF - NS at 75 ml/hr   -stable Hb and improvement in WBC  2. Tachycardia/Generalized weakness - likely due to recent recent chemotherapy and dehydration   - PT/OT evaluation   -TSH and Free T4 ordered.   3. Non-Hodgkin's lymphoma on chemotherapy - recently received C4 of are R-chop 8 days ago   - Appreciate oncology consult Dr. Kandyce Rud   - may need to reduced dose of future chemotherapy given the patient was unable to tolerate this round of chemotherapy   - Dilaudid for pain control   4. Pancytopenia secondary to recent chemotherapy, neutropenia   - empirically on meropenem   - f/u blood cultures NGTD, urine culture no growth   - appreciate Oncology Dr. Kandyce Rud   - S/P Neupogen outpatient   5. Hyponatremia with dehydration   - trial of gentle IVF NS at 75 ml/hr   6. Chest Pain: atypical and resolved.  -negative markers.                DVT Proph: Lovenox   Code Status: Full Code    Disposition: Home in 1-2 days, when cleared by Oncologist.   Discussed with nurse.    Subjective:   CC: mild tachycardia. No CP. Mild dizziness upon standing.     Medications:     Current Facility-Administered Medications   Medication Dose Route Frequency   . [COMPLETED] calcium GLUConate IVPB 1g  1 g Intravenous Once   . enoxaparin  40 mg Subcutaneous Daily at 1800   . meropenem  500 mg Intravenous Q6H SCH   . [COMPLETED]  potassium chloride  40 mEq Oral Q4H            . sodium chloride 75 mL/hr at 03/01/14 2123       Review of Systems:   A comprehensive review of systems was: Negative except for  General ROS: positive for  - malaise  negative for - fever  Psychological ROS: positive for - anxiety  Respiratory ROS: no cough, shortness of breath, or wheezing  Cardiovascular ROS: no chest pain or dyspnea on exertion  Gastrointestinal ROS: no abdominal pain, change in bowel habits, or black or bloody stools  Genito-Urinary ROS: no dysuria, trouble voiding, or hematuria  Musculoskeletal ROS: positive for - joint pain  Neurological ROS: no TIA or stroke symptoms  Dermatological ROS: negative    Physical Exam:     Filed Vitals:    03/01/14 2013 03/01/14 2237 03/02/14 0333 03/02/14 0823   BP: 148/72 132/54 125/50 130/62   Pulse: 112 124 123 104   Temp: 100.9 F (38.3 C) 100.4 F (38 C) 100.4 F (38 C) 97.7 F (36.5 C)   TempSrc: Oral Oral     Resp: 19 19 20 14    Height:       Weight:  SpO2: 95% 95% 96% 97%         Lab 03/02/14 0412 03/01/14 0527 02/28/14 0635 02/27/14 1306   GLU 90 89 95 114*       General appearance - alert, well appearing, and in no distress  Mental status - alert, oriented to person, place, and time  Eyes - pupils equal and reactive, extraocular eye movements intact  Lymphatics - no palpable lymphadenopathy, no hepatosplenomegaly  Chest - clear to auscultation, no wheezes, rales or rhonchi, symmetric air entry  Heart - normal rate, regular rhythm, normal S1, S2, no murmurs, rubs, clicks or gallops  Abdomen - soft, nontender, nondistended, no masses or organomegaly  Neurological - alert, oriented, normal speech, no focal findings or movement disorder noted  Musculoskeletal - no joint tenderness, deformity or swelling  Extremities - peripheral pulses normal, no pedal edema, no clubbing or cyanosis  Skin - normal coloration and turgor, no rashes, no suspicious skin lesions noted  pale        Labs:     Lab 03/02/14  0412 03/01/14 0527 02/28/14 0635   WBC 4.31 1.66* 0.38*   HGB 9.1* 8.8* 7.6*   HCT 26.1* 25.2* 22.3*   MCV 85.9 85.4 86.1   RDW 15 14 14    PLT 111* 72* 68*         Lab 03/02/14 0412 03/01/14 0527 02/28/14 0635 02/27/14 1306   NA 133* 134* 135* 129*   K 3.8 3.4* 3.7 3.7   CL 105 105 108* 97*   CO2 23 20* 20* 22   BUN 3* 4* 4* 12   CREAT 0.6 0.5* 0.6 0.7   GLU 90 89 95 114*   CA 8.1 7.8* 8.1 8.5         Lab 02/27/14 1306   AST 13   ALT 7   ALKPHOS 70   BILITOTAL 0.7   BILIDIRECT --   PROT 5.7*       blood culture: negative and urine culture: negative      Rads:   Radiological Procedure reviewed.  Please see above.    Chest AP Portable [[id number]] Collected:02/27/14 1439 Order Status:Completed Updated:02/27/14 1444 Narrative: History: Syncope.    FINDINGS: Portable AP view of the chest was obtained and compared to  prior study dated 08/19/2013. There is a normal cardiomediastinal  silhouette. The lungs are clear without focal consolidation. No  pneumothorax or pleural effusion is seen. There is a right chest wall  venous port with its tip at the cavoatrial junction.  Impression: No active disease seen in the chest.    Georgann Housekeeper, MD         Signed by: Clydell Hakim, MD

## 2014-03-02 NOTE — Plan of Care (Signed)
Problem: Safety  Goal: Patient will be free from injury during hospitalization  Outcome: Progressing  Reviewed safety precautions with pt, call light, hourly rounding, bed alarm, bathroom buddy, belongings within reach. Demonstrated and verbalized use of call light. Will cont to monitor.      Pt awake and alert, oriented x4. Pt in no apparent distress. Pt denies pain. PIV CDI and infusing. No BM. Urinated well Appetite fair, small meals. Bathed and linen changed. Son and husband visited today. Pt c/o dry cough, but refuses meds. MD AWare  Pt bed lowest and locked fall alarm on, call light within reach. All needs met. Will continue to monitor.

## 2014-03-02 NOTE — Progress Notes (Signed)
Dr. Mariane Masters notified of pt's 2nd troponin 0.12, 1st was 0.10.  No others were ordered, only x2.  This RN did not receive call that pt had abnormal labs.  Pt w/o complaints of chest pain.  Also notified of pt's low grade fever throughout the night.  Will repeat a troponin now.

## 2014-03-02 NOTE — Plan of Care (Signed)
Problem: Safety  Goal: Patient will be free from injury during hospitalization  Outcome: Progressing  OOB w/ standby assist.  Denies dizziness.  Pt's appetite is slightly better.  Hourly rounding.  Bed alarms on.  Call light at bedside.  Will continue to monitor for safety.    Problem: Hemodynamic Status: Immune Suppressed-Hematologic  Goal: Stable vital signs and fluid balance  Outcome: Progressing  Pt with low grade fever and tachy.  No tylenol warranted for low grade.  Will continue to monitor to see if pt spikes higher.  Pt w/o complaints.  Continue neutropenic precautions.

## 2014-03-03 ENCOUNTER — Inpatient Hospital Stay: Payer: Medicare Other

## 2014-03-03 LAB — URINALYSIS, REFLEX TO MICROSCOPIC EXAM IF INDICATED
Bilirubin, UA: NEGATIVE
Blood, UA: NEGATIVE
Glucose, UA: NEGATIVE
Leukocyte Esterase, UA: NEGATIVE
Nitrite, UA: NEGATIVE
Protein, UR: NEGATIVE
Specific Gravity UA: 1.007 (ref 1.001–1.035)
Urine pH: 6.5 (ref 5.0–8.0)
Urobilinogen, UA: NORMAL mg/dL

## 2014-03-03 LAB — CBC
Hematocrit: 25.8 % — ABNORMAL LOW (ref 37.0–47.0)
Hgb: 9 g/dL — ABNORMAL LOW (ref 12.0–16.0)
MCH: 29.9 pg (ref 28.0–32.0)
MCHC: 34.9 g/dL (ref 32.0–36.0)
MCV: 85.7 fL (ref 80.0–100.0)
MPV: 11.1 fL (ref 9.4–12.3)
Nucleated RBC: 0 /100 WBC (ref 0–1)
Platelets: 160 10*3/uL (ref 140–400)
RBC: 3.01 10*6/uL — ABNORMAL LOW (ref 4.20–5.40)
RDW: 15 % (ref 12–15)
WBC: 5.96 10*3/uL (ref 3.50–10.80)

## 2014-03-03 LAB — MAGNESIUM: Magnesium: 1.6 mg/dL (ref 1.6–2.6)

## 2014-03-03 LAB — GFR: EGFR: >60

## 2014-03-03 LAB — BASIC METABOLIC PANEL
Anion Gap: 8 (ref 5.0–15.0)
BUN: 3 mg/dL — ABNORMAL LOW (ref 7–19)
CO2: 21 mEq/L — ABNORMAL LOW (ref 22–29)
Calcium: 7.9 mg/dL (ref 7.9–10.6)
Chloride: 105 mEq/L (ref 98–107)
Creatinine: 0.5 mg/dL — ABNORMAL LOW (ref 0.6–1.0)
Glucose: 101 mg/dL — ABNORMAL HIGH (ref 70–100)
Potassium: 3.4 mEq/L — ABNORMAL LOW (ref 3.5–5.1)
Sodium: 134 mEq/L — ABNORMAL LOW (ref 136–145)

## 2014-03-03 MED ORDER — PIPERACILLIN-TAZOBACTAM 4.5 GM IN NS 100 ML IVPB (CNR)
4.5000 g | Freq: Three times a day (TID) | INTRAVENOUS | Status: DC
Start: 2014-03-03 — End: 2014-03-05
  Administered 2014-03-03 – 2014-03-05 (×6): 4.5 g via INTRAVENOUS
  Filled 2014-03-03 (×7): qty 100

## 2014-03-03 MED ORDER — VANCOMYCIN HCL 1000 MG IV SOLR
1000.0000 mg | INTRAVENOUS | Status: DC
Start: 2014-03-03 — End: 2014-03-05
  Administered 2014-03-03 – 2014-03-04 (×2): 1000 mg via INTRAVENOUS
  Filled 2014-03-03 (×3): qty 1000

## 2014-03-03 MED ORDER — POTASSIUM CHLORIDE CRYS ER 20 MEQ PO TBCR
40.0000 meq | EXTENDED_RELEASE_TABLET | ORAL | Status: AC
Start: 2014-03-03 — End: 2014-03-03
  Administered 2014-03-03 (×2): 40 meq via ORAL
  Filled 2014-03-03 (×2): qty 2

## 2014-03-03 MED ORDER — HYDROCODONE-ACETAMINOPHEN 10-325 MG PO TABS
1.0000 | ORAL_TABLET | Freq: Four times a day (QID) | ORAL | Status: DC | PRN
Start: 2014-03-03 — End: 2014-03-05

## 2014-03-03 NOTE — Consults (Signed)
Service Date: 03/03/2014     Patient Type: I     CONSULTING PHYSICIAN: Threasa Beards MD     REFERRING PHYSICIAN: Elsie Ra MD     REASON FOR CONSULTATION:  Antibiotic recommendation.  The patient is with possible pneumonia, fevers,   and initially neutropenic as a result of status post recent chemotherapy  for broad-spectrum antibiotic recommendations.     HISTORY OF PRESENT ILLNESS:  Maureen Vasquez is a very pleasant 80 year old female with a known past  medical history significant for non-Hodgkin's lymphoma status currently on  chemotherapy, last chemotherapy approximately 2 weeks ago now, who  presented initially for syncope.  She currently just had her fourth round  of chemo on February 19, 2014, but, because of witnessed syncope, she was sent  in for further evaluation.  Since admission, she was neutropenic and  developed persistent low-grade temperatures throughout.  She was  empirically started on meropenem for this.  Initial cultures and workup for  fever all returned back negative.   Within the last 24 hours, her white  count has recovered.  She is no longer neutropenic, however, her fevers,  which is a T-max of only 100.8, had been persistent despite being on the  meropenem to date.  As a result, Dr. Antony Madura pan cultured her this morning  as well as obtained a repeat urinalysis and chest x-ray.  Chest x-ray now  confirms the presence of bibasilar infiltrates.  The patient is also  actively coughing nonproductively at this point in time.  I am seeing her  officially for the first time today on March 03, 2014.  The patient is  otherwise resting comfortably in no apparent distress and appears nontoxic.   Again, several episodes of coughing while in my presence during my  interview with her today.     PAST MEDICAL HISTORY:  Again, as listed as above, including the non-Hodgkin's lymphoma, currently  on chemotherapy.  She also has status post appendectomy, squamous cell  carcinoma excision of the right  hand, and placement of a MediPort.  She has  a history of acute pancreatitis back in the 1990s.       SOCIAL HISTORY:    She has never smoked.  Social  drinker.  Non-IV drug abuser.  No recent  travel outside of [place].     FAMILY HISTORY:  Significant for coronary artery disease, parkinsonism, and hyperlipidemia.     CURRENT MEDICATIONS:  As per  [facility] including the meropenem.     REVIEW OF SYSTEMS:  At this time, significant just for worsening coughing, but nonproductive,  and persistent low-grade temperatures during this admission; otherwise, all  other systems have been reviewed and otherwise unremarkable.     PHYSICAL EXAMINATION:  GENERAL:  She is hemodynamically stable.  T-max 100.8.  HEENT:  Unremarkable.  NECK:  Supple.  LUNGS:  There are some crackles appreciated at the bases bilaterally.  HEART:  S1 and S2.  ABDOMEN:  Soft.  EXTREMITIES:  Nonedematous.  SKIN:  No rash.  No pruritus.  NEUROLOGIC:  No focal deficits.  Mood:  Normal affect.     LABORATORY DATA:  Once again, her neutropenia has resolved.  She has a white count now of  5.9, H and H of 9 and 26, and platelet count of 160,000.  When she first  came in on April 16, her white count was down to 0.24.  BUN and creatinine  of 3 and 0.5.  Remaining electrolytes appear to be  within normal limits.   Urinalysis today was cloudy, but no leukocyte esterase or nitrites.  Trace  ketones only.  A corresponding urine culture is pending.  Blood culture is  pending, however, the initial blood culture on admission was negative.     RADIOLOGICAL STUDIES:  Earlier today, the chest x-ray shows an interval development of bibasilar  opacities.  Initial chest x-ray showed no active disease seen.     ASSESSMENT:    In conclusion, this is a 80 year old female with NHL, status post  chemotherapy just 2 weeks ago, who comes in with altered mental status and  syncope. Clinically, mental status is stable and at baseline, but,  throughout this admission to date, she has been  having persistent low-grade  temperatures,  initially with neutropenia.  The neutropenia has fully  recovered.  She has a white count now of 5.9, but still with persistent  temperature despite being on IV meropenem since admission.  Workup to date  now also shows developing pneumonia on chest x-ray.  Again, on physical  exam, she does have bibasilar crackles.  In addition to that, she has  nonproductive cough during my examination.       PLAN:    At this time, we will treat the patient for hospital-acquired pneumonia.   As a result, I will modify her antibiotics to as follows:  I will  discontinue meropenem and start the patient both on vancomycin 1 gram IV  q.24 hours and check the trough level at the third dose, and Zosyn 4.5  grams IV q.8 hours. This combination should cover for healthcare-associated  pneumonia.  I have also ordered a sputum culture just in case the patient  is able to produce some productive sputum.  We will follow up on the  cultures and will closely follow the patient from this point on.     Thanks again for this very interesting consultation.  Further  recommendations to follow.       Overall time of consultation took 60 minutes.           D:  03/03/2014 13:15 PM by Dr. Renae Fickle B. Ankur Snowdon, MD ([id number])  T:  03/03/2014 16:47 PM by       Everlean Cherry: [id number]) (Doc ID: [id number])

## 2014-03-03 NOTE — Progress Notes (Signed)
She is a 80 year old woman followed by Dr. Allena Katz with a history of  non-Hodgkin's lymphoma, recently receiving R-CHOP, who presented with  syncope and pancytopenia with fever.  The patient now is complaining of a  dry cough and continued weakness.  She is eating, but not particularly  well.     PHYSICAL EXAMINATION:  GENERAL:  She looks weak.  VITAL SIGNS:  Temperature 98.6, pulse 94, respiratory rate 18, blood  pressure 121/56.  T-max is 100.6.  LUNGS:  Clear.  HEART:  Regular rate and rhythm.  ABDOMEN:  Soft.     Chest x-ray shows interval development of minimal bibasilar opacities.     LABORATORY DATA:  White blood cell count now 5.96, hematocrit 26, platelet count 160.   Creatinine 0.5.     MEDICATIONS:  Include Lovenox, Zosyn, potassium, and vancomycin.     IMPRESSION AND PLAN:  1.  Febrile neutropenia.  The patient's white blood cell count has  recovered.  She is developing some lung findings possibly secondary to  recovery of her white blood cell count into a new pneumonia.  The patient  on broad-spectrum antibiotics.  Agree with recommendation for possible CT  scan.  2.  Weakness.  The patient has been seen by physical therapy and  occupational therapy.    3.  Discharge planning.  The patient will follow up with Dr. Allena Katz at the  time of discharge.  Given fever this morning and continued respiratory  problems, would not recommend discharge today.

## 2014-03-03 NOTE — Progress Notes (Addendum)
[facility]  HOSPITALIST  PROGRESS NOTE      Patient: Maureen Vasquez  Date: 03/03/2014   LOS: 4 Days  Admission Date: 02/27/2014   MRN: [id number]  Attending: Lamar Benes MD     ASSESSMENT/PLAN     Maureen Vasquez is a 80 y.o. female history of non-Hodgkin's lymphoma, currently on chemotherapy, presenting with 2 episodes of syncope.    1.  Recurrent syncope - likely 2/2 to symptomatic anemia, dehydration and recent chemotherapy  - continue to monitor on telemetry   - TTE shows normal LV size and function, aortic sclerosis without stenosis, and mild mitral regurgitation  - s/p transfusion of 1 unit of packed red blood cells for hemoglobin less than 8 and symptomatic anemia. Hgb responded appropriately, Hgb now stable around 9  - Maintain active type and screen  - Recheck complete blood count in a.m.    2. Pneumonia - patient has fevers - low grade temps of 100.9, 100.4. Recently neutrapenic. Counts appear to be improving now   Plan: - appreciate ID consult Dr. Leodis Sias   - CXR shows bilateral infiltrates possible pneumonia   - antibiotics per ID - she is on Vancomycin and Cefepime   - urinalysis is negative, f/u blood cultures sent today  - check legionella and strep pneumonia     3.  Generalized weakness - likely due to recent recent chemotherapy and dehydration  - PT recommends home with supervision, home health PT, pending re-evaluation (TBD)    4.  Non-Hodgkin's lymphoma on chemotherapy - recently received C4 of are R-chop 8 days ago  - Appreciate oncology consult Dr. Kandyce Rud   - may need to reduced dose of future chemotherapy given the patient was unable to tolerate this round of chemotherapy  -  D/c Dilaudid, down grade to Norco PRN    5.  Pancytopenia secondary to recent chemotherapy, neutropenia - improving slightly   - empirically on meropenem  - f/u blood cultures NGTD, urine culture no growth   - appreciate Oncology Dr. Kandyce Rud   - S/P Neupogen outpatient    6. Hyponatremia with dehydration - Na is stable  around 135 - 134  --> 134 stable   - discontinue IVF at this time  - check urine lytes - urine sodium, urine cr, urine osms, serum osms     7. Tachycardia -110s - 130s. EKG shows sinus tachycardia. Reviewed tele with tech. TTE shows normal LV function. TSH wnl. Possibly related to fever and infection, will monitor  - check D-dimer to rule out PE as cause of persistent tachycardia and chest discomfort, if positive will need CT angio for further evaluation - will also be able to eval bilateral pna seen on CXR     8. Hypokalemia - replete as needed  9. Atypical chest discomfort - resolved. Troponins mildly elevated at 0.12 -->0.12. Suspect demand ischemia in the setting of possible PNA (bilateral infiltrates)  Plan: - not on aspirin because of allergy  - given suspect demand ischemia, no metoprolol or full strength lovenox   - MUGA scan from January 2015 showed normal scan with an LVEF of 56%, wall motion are grossly normal     DVT PPX: lovenox   Diet: neutrapenic diet   Code: Full code   Dispo: pending medical improvement    SUBJECTIVE     Patient is feeling better, but she is now having low grade temps with fevers. No further chest pain but some SOB with deep breaths. No other complaints  or events overnight     MEDICATIONS     Current Facility-Administered Medications   Medication Dose Route Frequency   . enoxaparin  40 mg Subcutaneous Daily at 1800   . piperacillin-tazobactam  4.5 g Intravenous Q8H SCH   . [COMPLETED] potassium chloride  40 mEq Oral Q4H   . vancomycin  1,000 mg Intravenous Q24H   . [DISCONTINUED] meropenem  500 mg Intravenous Q6H SCH       ROS     Remainder of 10 point ROS as above or otherwise negative    PHYSICAL EXAM     Filed Vitals:    03/03/14 1557   BP: 142/62   Pulse: 127   Temp: 100.7 F (38.2 C)   Resp: 20   SpO2: 94%       Temperature: Temp  Min: 95.8 F (35.4 C)  Max: 100.7 F (38.2 C)  Pulse: Pulse  Min: 92   Max: 127   Respiratory: Resp  Min: 18   Max: 20   Non-Invasive BP: BP  Min:  106/39  Max: 142/62  Pulse Oximetry SpO2  Min: 92 %  Max: 96 %    Intake and Output Summary (Last 24 hours) at Date Time    Intake/Output Summary (Last 24 hours) at 03/03/14 1625  Last data filed at 03/03/14 0600   Gross per 24 hour   Intake   1436 ml   Output      0 ml   Net   1436 ml       GEN APPEARANCE: Normal; pale, frail appearing female   HEENT: PERLA; EOMI; Conjunctiva Clear  NECK: Supple; No bruits  CVS: RRR, S1, S2; No M/G/R  LUNGS: CTAB; No Wheezes; No Rhonchi: No rales  ABD: Soft; No TTP; + Normoactive BS  EXT: No edema; Pulses 2+ and intact  SKIN: No rash or Lesions  NEURO: CN 2-12 intact; No Focal neurological deficits      LABS       Lab 03/03/14 0615 03/02/14 0412 03/01/14 0527   WBC 5.96 4.31 1.66*   RBC 3.01* 3.04* 2.95*   HGB 9.0* 9.1* 8.8*   HCT 25.8* 26.1* 25.2*   MCV 85.7 85.9 85.4   PLT 160 111* 72*         Lab 03/03/14 0615 03/02/14 0412 03/01/14 0527 02/28/14 0635 02/27/14 1306   NA 134* 133* 134* 135* 129*   K 3.4* 3.8 3.4* 3.7 3.7   CL 105 105 105 108* 97*   CO2 21* 23 20* 20* 22   BUN 3* 3* 4* 4* 12   CREAT 0.5* 0.6 0.5* 0.6 0.7   GLU 101* 90 89 95 114*   CA 7.9 8.1 7.8* 8.1 8.5   MG 1.6 -- -- -- 1.8         Lab 02/27/14 1306   ALT 7   AST 13   GGT --   BILITOTAL 0.7   BILIDIRECT --   ALB 3.5   ALKPHOS 70         Lab 03/02/14 0412 03/01/14 2002 03/01/14 1510   CK -- -- --   CKMB -- -- --   CKMBINDEX -- -- --   TROPI 0.12* 0.12* 0.10*         Lab 02/27/14 1306   INR 0.9   PT 12.4*   PTT --         RADIOLOGY     Radiological Procedure reviewed.    XR CHEST AP  PORTABLE    Final Result:  No active disease seen in the chest.         Georgann Housekeeper, MD     02/27/2014 2:40 PM       XR CHEST 2 VIEWS    Final Result:      Interval development of minimal bibasilar opacities. Continued follow-up    advised.        Wilmon Pali, MD     03/03/2014 10:23 AM       ECHOCARDIOGRAM ADULT COMPLETE W CLR/ DOPP WAVEFORM    Final Result:       Normal LV size and function.        Aortic sclerosis.        Mild  mitral regurgitation        Jodelle Gross, MD     02/28/2014 3:43 PM           Signed,  Lamar Benes MD  4:25 PM 03/03/2014

## 2014-03-03 NOTE — Progress Notes (Addendum)
Case Management Initial Discharge Planning Assessment    PLAN OF CARE:         Psychosocial/Demographic Information   Name of interviewee: Patient   Healthcare Decision Maker (HDM) (if other than the patient) self   HDM - Relationship to Patient Not applicable   HDM - Contact Information Not applicable   Pt lives with Husband Maureen Vasquez in single family home   Type of residence where patient lives Living Arrangements: Spouse/significant other]  Type of Home: House]  Home Layout: Stairs to enter with rails (add number in comment);Able to live on main level with bedroom/bathroom]     Prior level of functioning (ambulation & ADLs)  ]   ]Independent but recent recurrent syncope     Correct Insurance listed on face sheet - verified with the patient/HDM Yes      Any additional emergency contacts? Extended Emergency Contact Information  Primary Emergency Contact: Goetze,Allen  Address: [address] [facility] [address] [address]           [place], [place] [address] [place]  Home Phone: [phone]  Mobile Phone: [phone]  Relation: Spouse   Does the patient have an Advance Directive? Not Received  Advance Directive: Patient has advance directive, copy in chart;Patient has advance directive, copy not in chart]     Is the POA/Guardianship documentation in shadow chart? (if applicable)  Healthcare Agent Appointed: No]  Healthcare Agent's Name: NA]  Healthcare Agent's Phone Number: NA]   Source of Income (SSDI. SSI. Social Security, pension, employment, Catering manager) Social security; pension     Economist in Place  Name of Primary Care Physician verified in patient banner (update in patient banner if not listed).  If no PCP, call [phone] with patient in room to get them connected with a PCP. Mack Hook, MD  [phone]   What DME does the patient currently own/have at home? (rolling walker, hospital bed, home O2, BiPAP/CPAP, bedside commode, cane, hoyer lift)  ]  Assistive Devices: Eyeglasses]   ]   Has the  patient been to an Acute Rehab or SNF in the past?  If so, where? No   Does the patient currently have home health or hospice/palliative services in place?  If so, list agency name. No   Would the patient benefit from a PT/OT order? If so, is it ordered?    Does the patient already have community dialysis set up?  If so, where? No     Readmission Assessment  Current LACE Score Reviewed? Yes/No 10   Is this patient an inpatient to inpatient 30 day readmission? No   Does the patient have difficulty obtaining his/her medications? No   Follow-up appt made with: [facility], [facility] or private pcp? Appointment will be made prior to discharge   Does the patient have difficulty getting to his/her appointment? No       Anticipated Discharge Plan  Discussed Anticipated Discharge Date and Discharge Disposition Possibilities with: _x__Patient   ___Healthcare Decision Maker  ___Other   Anticipated Disposition: Option A Home pending PT/OT assessment   Anticipated Disposition: Option B Not applicable   Who will transport the patient when ready for discharge? (offer wheelchair Zenaida Niece service if patient/family cannot identify transport plan) Husband   If applicable, were SNF or Hospice choices provided? No   Palliative Care Consult needed? (if yes, contact attending MD)  No   Geriatrics Consult needed? (if yes, contact attending MD) No   Elderlink Referral needed? (if yes, refer  through [facility]) No   TCM Referral needed? (if yes, refer through [facility]) No   PACE Referral needed? (if yes, refer through [facility]) No   Are there any potential barriers to discharge identified?      ___Lack of Insurance  ___Lack of Health Literacy  ___Undocumented  ___No resources for meds or medical care  ___Transportation issues  ___Language/Cultural/Spiritual  ___Cognitive level / capacity  ___Psychiatric or substance abuse issues  _x__Co-morbidities  ___Potential abuse or neglect  ___Safety issues in the home  ___Potential placement  issues  ___Pt / family disagreement with d/c plan  ___Lack of family support  ___Lack of extended family / friend support  ___Home Estate agent (multi-level home/access          issues)   ___ NONE     Inpatient Medicare/Medicare HMO Patients Only  Was an initial IMM signed within 24 hours of admission?  (Look in Media Tab, Documents Table or Shadow Chart)  ]  Yes     Uninsured Patients Only  If patient has a spouse, does your spouse have insurance under his/her place of employment? No   Did the patient sign up for insurance through the Affordable Care Act? No       COMMENTS: Pt declined referral to life with cancer

## 2014-03-03 NOTE — Plan of Care (Signed)
Problem: Moderate/High Fall Risk Score >/=15  Goal: Patient will remain free of falls  Outcome: Progressing  A&O x3, good at calling for assistance. Bed is locked and in lowest position. Bed alarm is on at all time. Call bell and phone within reach. Hourly rounding is on going. Patient safety is maintained.     Problem: Hemodynamic Status: Immune Suppressed-Hematologic  Goal: Stable vital signs and fluid balance  Outcome: Progressing  VSS except low grade temp at 100.7. Tylenol 650mg  given with good result.    Problem: Infection/Potential for Infection  Goal: Free from infection  Outcome: Not Progressing  PNA showing on X-ray, on vaco and zosyn IV antibiotics.    Problem: Activity Intolerance /Fatigue  Goal: Fatigue Management  Outcome: Progressing  OOB to BR with standby help.

## 2014-03-03 NOTE — Plan of Care (Signed)
Problem: Moderate/High Fall Risk Score >/=15  Goal: Patient will remain free of falls  Outcome: Progressing  Fall prevention plan has been reviewed with patient, patient verbalized understanding of  all information.  Call bell  within reach, patient understands to call when help is needed.     Problem: Safety  Goal: Patient will be free from injury during hospitalization  Outcome: Progressing  Neutropenic precautions maintained, assisted to the bathroom several times this shift, patient understands to call for help when help is needed.     Problem: Pain  Goal: Patient's pain/discomfort is manageable  Outcome: Progressing  Medicated X1 with 650 mg Tylenol for a headache, will continue to monitor.     Problem: Infection/Potential for Infection  Goal: Free from infection  Outcome: Progressing  Neutropenic precaution maintained.

## 2014-03-04 ENCOUNTER — Inpatient Hospital Stay: Payer: Medicare Other

## 2014-03-04 LAB — CBC
Hematocrit: 25.7 % — ABNORMAL LOW (ref 37.0–47.0)
Hgb: 9.2 g/dL — ABNORMAL LOW (ref 12.0–16.0)
MCH: 30.9 pg (ref 28.0–32.0)
MCHC: 35.8 g/dL (ref 32.0–36.0)
MCV: 86.2 fL (ref 80.0–100.0)
MPV: 10.4 fL (ref 9.4–12.3)
Nucleated RBC: 0 /100 WBC (ref 0–1)
Platelets: 230 10*3/uL (ref 140–400)
RBC: 2.98 10*6/uL — ABNORMAL LOW (ref 4.20–5.40)
RDW: 15 % (ref 12–15)
WBC: 7.24 10*3/uL (ref 3.50–10.80)

## 2014-03-04 LAB — BASIC METABOLIC PANEL
Anion Gap: 9 (ref 5.0–15.0)
BUN: 4 mg/dL — ABNORMAL LOW (ref 7–19)
CO2: 20 mEq/L — ABNORMAL LOW (ref 22–29)
Calcium: 8.1 mg/dL (ref 7.9–10.2)
Chloride: 104 mEq/L (ref 100–111)
Creatinine: 0.6 mg/dL (ref 0.6–1.0)
Glucose: 95 mg/dL (ref 70–100)
Potassium: 4 mEq/L (ref 3.5–5.1)
Sodium: 133 mEq/L — ABNORMAL LOW (ref 136–145)

## 2014-03-04 LAB — MAGNESIUM: Magnesium: 1.6 mg/dL (ref 1.6–2.6)

## 2014-03-04 LAB — GFR: EGFR: >60

## 2014-03-04 LAB — IHS D-DIMER: D-Dimer: 1.16 ug/mL FEU — ABNORMAL HIGH (ref 0.00–0.51)

## 2014-03-04 LAB — PHOSPHORUS: Phosphorus: 2.5 mg/dL (ref 2.3–4.7)

## 2014-03-04 MED ORDER — IOHEXOL 350 MG/ML IV SOLN
80.0000 mL | Freq: Once | INTRAVENOUS | Status: AC | PRN
Start: 2014-03-04 — End: 2014-03-04
  Administered 2014-03-04: 80 mL via INTRAVENOUS

## 2014-03-04 MED ORDER — FUROSEMIDE 10 MG/ML IJ SOLN
20.0000 mg | Freq: Once | INTRAMUSCULAR | Status: DC
Start: 2014-03-04 — End: 2014-03-04

## 2014-03-04 MED ORDER — FUROSEMIDE 20 MG PO TABS
20.0000 mg | ORAL_TABLET | Freq: Once | ORAL | Status: AC
Start: 2014-03-04 — End: 2014-03-04
  Administered 2014-03-04: 20 mg via ORAL
  Filled 2014-03-04: qty 1

## 2014-03-04 NOTE — Progress Notes (Addendum)
[facility]  HOSPITALIST  PROGRESS NOTE      Patient: Maureen Vasquez  Date: 03/04/2014   LOS: 5 Days  Admission Date: 02/27/2014   MRN: [id number]  Attending: Lamar Benes MD     ASSESSMENT/PLAN     Maureen Vasquez is a 80 y.o. female history of non-Hodgkin's lymphoma, currently on chemotherapy, presenting with 2 episodes of syncope.    1.  Recurrent syncope - likely 2/2 to symptomatic anemia, dehydration and recent chemotherapy  - continue to monitor on telemetry - has episodes of tachycardia but no arrhythmia   - TTE shows normal LV size and function, aortic sclerosis without stenosis, and mild mitral regurgitation  - s/p transfusion of 1 unit of packed red blood cells on admission for hemoglobin less than 8 and symptomatic anemia. Hgb responded appropriately, Hgb stable around 9    2. Pneumonia - patient has fevers - low grade temps of 100.7, Recently neutrapenic. Counts appear to be improving now   Plan: - appreciate ID consult Dr. Leander Rams  - CXR shows bilateral infiltrates possible pneumonia   - sent for CT angio which shows ground glass opacity in the left upper lobe, minimal basilar opacities probably from atelectasis with trace effusions.   - antibiotics per ID - she is on Vancomycin and Cefepime, check vanco level   - urinalysis is negative, f/u blood cultures NGTD  - check legionella negative and strep pneumonia negative    3.  Generalized weakness - likely due to recent recent chemotherapy and dehydration  - PT recommends home with supervision, home health PT,  PT recommend home health PT    4.  Non-Hodgkin's lymphoma on chemotherapy - recently received C4 of are R-chop 8 days ago  - Appreciate oncology consult Dr. Kandyce Rud   - may need to reduced dose of future chemotherapy given the patient was unable to tolerate this round of chemotherapy  - c/w Norco PRN    5.  Pancytopenia secondary to recent chemotherapy, neutropenia - improving slightly   - empirically on meropenem  - f/u blood cultures NGTD, urine  culture no growth   - appreciate Oncology Dr. Kandyce Rud   - S/P Neupogen outpatient    6. Hyponatremia with dehydration - Na is stable around 135 - 134  --> 134 stable     7. Tachycardia -110s - 130s. EKG shows sinus tachycardia. Reviewed tele with tech. TTE shows normal LV function. CT angio negative for PE. TSH wnl. Possibly related to fever and infection, will monitor     8. Hypokalemia - replete as needed  9. Atypical chest discomfort - resolved. Troponins mildly elevated at 0.12 -->0.12. Suspect demand ischemia in the setting of possible PNA (bilateral infiltrates)  Plan: - not on aspirin because of allergy  - given suspect demand ischemia, no metoprolol or full strength lovenox   - MUGA scan from January 2015 showed normal scan with an LVEF of 56%, wall motion are grossly normal     DVT PPX: lovenox   Diet: neutrapenic diet   Code: Full code   Dispo: pending medical improvement - still spiking fevers    SUBJECTIVE     Patient doing well this morning.  States that her shortness of breath is improving.  However, she continues to spike low-grade fevers overnight.  No chills. She does report cough but no sputum production.    MEDICATIONS     Current Facility-Administered Medications   Medication Dose Route Frequency   . enoxaparin  40 mg  Subcutaneous Daily at 1800   . [COMPLETED] furosemide  20 mg Oral Once   . piperacillin-tazobactam  4.5 g Intravenous Q8H SCH   . vancomycin  1,000 mg Intravenous Q24H   . [DISCONTINUED] furosemide  20 mg Intravenous Once       ROS     Remainder of 10 point ROS as above or otherwise negative    PHYSICAL EXAM     Filed Vitals:    03/04/14 1558   BP: 132/61   Pulse: 115   Temp: 95.9 F (35.5 C)   Resp: 20   SpO2: 95%       Temperature: Temp  Min: 95.4 F (35.2 C)  Max: 97.9 F (36.6 C)  Pulse: Pulse  Min: 107   Max: 133   Respiratory: Resp  Min: 19   Max: 20   Non-Invasive BP: BP  Min: 96/66  Max: 140/66  Pulse Oximetry SpO2  Min: 91 %  Max: 98 %    Intake and Output Summary (Last  24 hours) at Date Time  No intake or output data in the 24 hours ending 03/04/14 1617    GEN APPEARANCE: Normal; pale, frail appearing female   HEENT: PERLA; EOMI; Conjunctiva Clear  NECK: Supple; No bruits  CVS: RRR, S1, S2; No M/G/R  LUNGS: CTAB; No Wheezes; No Rhonchi: No rales  ABD: Soft; No TTP; + Normoactive BS  EXT: No edema; Pulses 2+ and intact  SKIN: No rash or Lesions  NEURO: CN 2-12 intact; No Focal neurological deficits      LABS       Lab 03/04/14 0710 03/03/14 0615 03/02/14 0412   WBC 7.24 5.96 4.31   RBC 2.98* 3.01* 3.04*   HGB 9.2* 9.0* 9.1*   HCT 25.7* 25.8* 26.1*   MCV 86.2 85.7 85.9   PLT 230 160 111*         Lab 03/04/14 0709 03/03/14 0615 03/02/14 0412 03/01/14 0527 02/28/14 0635 02/27/14 1306   NA 133* 134* 133* 134* 135* --   K 4.0 3.4* 3.8 3.4* 3.7 --   CL 104 105 105 105 108* --   CO2 20* 21* 23 20* 20* --   BUN 4* 3* 3* 4* 4* --   CREAT 0.6 0.5* 0.6 0.5* 0.6 --   GLU 95 101* 90 89 95 --   CA 8.1 7.9 8.1 7.8* 8.1 --   MG 1.6 1.6 -- -- -- 1.8         Lab 02/27/14 1306   ALT 7   AST 13   GGT --   BILITOTAL 0.7   BILIDIRECT --   ALB 3.5   ALKPHOS 70         Lab 03/02/14 0412 03/01/14 2002 03/01/14 1510   CK -- -- --   CKMB -- -- --   CKMBINDEX -- -- --   TROPI 0.12* 0.12* 0.10*         Lab 02/27/14 1306   INR 0.9   PT 12.4*   PTT --         RADIOLOGY     Radiological Procedure reviewed.    XR CHEST AP PORTABLE    Final Result:  No active disease seen in the chest.         Georgann Housekeeper, MD     02/27/2014 2:40 PM       XR CHEST 2 VIEWS    Final Result:      Interval development of minimal  bibasilar opacities. Continued follow-up    advised.        Wilmon Pali, MD     03/03/2014 10:23 AM       CT ANGIOGRAM CHEST    Final Result:      1. No CT scan evidence for detectable pulmonary embolism.    2. Minimal groundglass opacity consolidation left upper lobe.    3. Minimal basilar opacities probably from atelectasis, with adjacent    trace pleural effusions.    4. Remainder as above.        Wilmon Pali, MD     03/04/2014 9:36 AM       ECHOCARDIOGRAM ADULT COMPLETE W CLR/ DOPP WAVEFORM    Final Result:       Normal LV size and function.        Aortic sclerosis.        Mild mitral regurgitation        Jodelle Gross, MD     02/28/2014 3:43 PM           Signed,  Lamar Benes MD  4:17 PM 03/04/2014

## 2014-03-04 NOTE — Progress Notes (Signed)
PROGRESS NOTE    Date Time: 03/04/2014 12:40 PM  Patient Name: Maureen Vasquez, Maureen Vasquez      Assessment:   Pneumonia  Non Hodgkin's Lymphoma  S/P chemotherapy  Pancytopenia    Plan:   CT chest reveals ground glass opacity Left upper Lobe and basilar opacities lower lobes  Symptomatically improving  Continue vancomycin and zosyn for now  Continue to observe  Cultures remain negative so far  Subjective:   Patient with nonproductive cough  Denies SOB or chest pain  States that she feels better  Able to take PO      Past Medical History   Diagnosis Date   . H/O acute pancreatitis 1990's   . Back pain    . Seasonal allergic rhinitis    . NHL (non-Hodgkin's lymphoma) October 2013     w/ Splenic Nodules         Principal Problem:   *Syncope and collapse  Active Problems:   Neutropenia   NHL (non-Hodgkin's lymphoma)   Generalized weakness        Allergies as of 02/27/2014 Loraine Leriche as Reviewed 02/27/2014   Allergen Reaction Noted   . Aspirin Other (See Comments) 11/16/2011   . Codeine Nausea Only 11/16/2011   . Salicylates  12/23/2013       Medications:     Current Facility-Administered Medications   Medication Dose Route Frequency   . enoxaparin  40 mg Subcutaneous Daily at 1800   . furosemide  20 mg Oral Once   . piperacillin-tazobactam  4.5 g Intravenous Q8H SCH   . [COMPLETED] potassium chloride  40 mEq Oral Q4H   . vancomycin  1,000 mg Intravenous Q24H   . [DISCONTINUED] furosemide  20 mg Intravenous Once   . [DISCONTINUED] meropenem  500 mg Intravenous Q6H SCH       Review of Systems:   Negative for sinus symptoms, sore throat,  shortness of breath, nausea, vomiting, diarrhea, dysuria, lower extremity edema.    Physical Exam:     Filed Vitals:    03/04/14 1132   BP: 112/53   Pulse: 107   Temp: 95.4 F (35.2 C)   Resp: 19   SpO2: 92%       Intake and Output Summary (Last 24 hours) at Date Time    Intake/Output Summary (Last 24 hours) at 03/04/14 1240  Last data filed at 03/03/14 1424   Gross per 24 hour   Intake    800 ml   Output       0 ml   Net    800 ml       No Acute Distress  Eyes Anicteric  Throat Clear  Neck Supple  Lungs Bibasilar crackles  Heart Regular Rate and Rhythm  Abdomen Soft, Nontender, Normal Bowel Sounds  No Lower Extremity Edema      Labs:     Results     Procedure Component Value Units Date/Time    Basic Metabolic Panel [[id number]]  (Abnormal) Collected:03/04/14 0709    Specimen Information:Blood Updated:03/04/14 0813     Glucose 95 mg/dL      BUN 4 (L) mg/dL      Creatinine 0.6 mg/dL      CALCIUM 8.1 mg/dL      Sodium 409 (L) mEq/L      Potassium 4.0 mEq/L      Chloride 104 mEq/L      CO2 20 (L) mEq/L      Anion Gap 9.0     Magnesium [[id number]]  Collected:03/04/14 0709    Specimen Information:Blood Updated:03/04/14 0813     Magnesium 1.6 mg/dL     Phosphorus [[id number]] Collected:03/04/14 0709    Specimen Information:Blood Updated:03/04/14 0813     Phosphorus 2.5 mg/dL     GFR [[id number]] NWGNFAOZH:08/65/78 0709     EGFR >60.0 Updated:03/04/14 0813    D-Dimer [[id number]]  (Abnormal) Collected:03/04/14 0709     D-Dimer 1.16 (H) ug/mL FEU Updated:03/04/14 0730    CBC [[id number]]  (Abnormal) Collected:03/04/14 0710    Specimen Information:Blood / Blood Updated:03/04/14 0723     WBC 7.24 x10 3/uL      RBC 2.98 (L) x10 6/uL      Hgb 9.2 (L) g/dL      Hematocrit 27.2 (L) %      MCV 86.2 fL      MCH 30.9 pg      MCHC 35.8 g/dL      RDW 15 %      Platelets 230 x10 3/uL      MPV 10.4 fL      Nucleated RBC 0 /100 WBC     Legionella antigen, urine [[id number]] Collected:03/03/14 1402    Specimen Information:Urine / Urine, Clean Catch Updated:03/04/14 0044    Narrative:    ORDER#: [id number]                                    ORDERED BY: RAMILO, PAUL  SOURCE: Urine, Clean Catch                           COLLECTED:  03/03/14 14:02  ANTIBIOTICS AT COLL.:                                RECEIVED :  03/03/14 16:54  Legionella, Rapid Urinary Antigen          FINAL       03/04/14 00:44  03/04/14   Negative for Legionella pneumophila  Serogroup 1 Antigen             Limitations of Test:             1. Negative results do not exclude infection with Legionella                pneumophila Serogroup 1.             2. Does not detect other serogroups of L. pneumophila                or other Legionella species.             Test Reference Range: Negative      STREP PNEUMONIA, RAPID URINARY ANTIGEN [[id number]] Collected:03/03/14 1402    Specimen Information:Urine, Clean Catch Updated:03/04/14 0043    Narrative:    ORDER#: [id number]                                    ORDERED BY: Sissy Hoff  SOURCE: Urine, Clean Catch                           COLLECTED:  03/03/14 14:02  ANTIBIOTICS AT COLL.:  RECEIVED :  03/03/14 16:54  S. pneumoniae, Rapid Urinary Antigen       FINAL       03/04/14 00:43  03/04/14   Negative for Streptococcus pneumoniae Urinary Antigen             Note:             This is a presumptive test for the direct qualitative             detection of bacterial antigen. This test is not intended as             a substitute for a gram stain and bacterial culture. Samples             with extremely low levels of antigen may yield negative             results.             Reference Range: Negative      Blood Culture Aerobic/Anaerobic #1 [[id number]] Collected:02/27/14 1505    Specimen Information:Blood Updated:03/03/14 1703    Narrative:    ORDER#: [id number]                                    ORDERED BY: GENTRY, DAVID  SOURCE: Blood Arm                                    COLLECTED:  02/27/14 15:05  ANTIBIOTICS AT COLL.:                                RECEIVED :  02/27/14 16:52  Culture Blood Aerobic and Anaerobic        PRELIM      03/03/14 17:03  02/28/14   No Growth after 1 day/s of incubation.  03/01/14   No Growth after 2 day/s of incubation.  03/02/14   No Growth after 3 day/s of incubation.  03/03/14   No Growth after 4 day/s of incubation.      Blood Culture Aerobic/Anaerobic #2 [[id number]] Collected:02/27/14  1456    Specimen Information:Blood Updated:03/03/14 1703    Narrative:    ORDER#: [id number]                                    ORDERED BY: GENTRY, DAVID  SOURCE: Blood mediport                               COLLECTED:  02/27/14 14:56  ANTIBIOTICS AT COLL.:                                RECEIVED :  02/27/14 16:52  Culture Blood Aerobic and Anaerobic        PRELIM      03/03/14 17:03  02/28/14   No Growth after 1 day/s of incubation.  03/01/14   No Growth after 2 day/s of incubation.  03/02/14   No Growth after 3 day/s of incubation.  03/03/14   No Growth after 4 day/s of incubation.      Culture Staph aureus and MRSA [place] [  [id number]] Collected:03/03/14 1402    Specimen Information:Nares Updated:03/03/14 1654    CULTURE BLOOD AEROBIC AND ANAEROBIC [[id number]] Collected:03/03/14 1116    Specimen Information:Blood, Venipuncture Updated:03/03/14 1350    Narrative:    1 BLUE+1 PURPLE              Rads:     Radiology Results (24 Hour)     Procedure Component Value Units Date/Time    CT Angiogram Chest [[id number]] Collected:03/04/14 0928    Order Status:Completed  Updated:03/04/14 0102    Narrative:    Reason for exam: 80 year old female with tachycardia and chest pain.  Evaluate for pulmonary embolism.    TECHNIQUE: Spiral CT angiography after IV contrast. 80 cc Omnipaque 350  administered. MIPS generated.    FINDINGS:  There are no detectable pulmonary emboli. The pulmonary arteries are  normal caliber. Heart is normal size. Aorta reveals no focal aneurysm or  dissection.    The lungs reveal minimal groundglass opacity consolidation in the left  upper lobe, and there are minimal basilar opacities probably from  atelectasis. There are adjacent trace pleural effusions. There is no  pneumothorax. In the mediastinum, there is no significant  lymphadenopathy or pericardial effusion. There is a small hiatal hernia.    Mild arthritis is seen throughout the spine.      Impression:      1. No CT scan evidence for detectable  pulmonary embolism.  2. Minimal groundglass opacity consolidation left upper lobe.  3. Minimal basilar opacities probably from atelectasis, with adjacent  trace pleural effusions.  4. Remainder as above.    Wilmon Pali, MD   03/04/2014 9:36 AM            Signed by: Kearney Hard

## 2014-03-04 NOTE — PT Progress Note (Signed)
[place] [facility]  Physical Therapy Treatment    Patient:  Maureen Vasquez MRN#:  [id number]  Unit:  [facility] Room/Bed:  V409/W119-14    Assessment:   After today's treatment patient able to increase ambulation distance however demonstrates deconditioning with increased SOB during ambulation.  Assessment: Decreased endurance/activity tolerance;Decreased balance, resulting in continued difficulty with endurance.  Prognosis: Good    Interdisciplinary Communication:   Patient left in bed, husband at bedside and call bell within reach. Communicated with RN regarding patient's medical status.    Education:   Further education provided to patient/caregiver on importance of frequent mobilization and pacing.    Plan:   Goals  Goal Formulation: With patient  Time for Goal Acheivement: 3 visits  Pt Will Perform Sit to Stand: Independently;to maximize functional mobility and independence;Partly met  Pt Will Ambulate: 101-150 feet;Independently;to maximize functional mobility and independence;Partly met  Pt Will Go Up / Down Stairs: 3-5 stairs;to maximize functional mobility and independence;With stand by assist;Goal met  Plan  Risks/Benefits/POC Discussed with Pt/Family: With patient  Patient Goal: home  Treatment/Interventions: Gait training;LE strengthening/ROM  PT Frequency: 3-4x/wk    Continue plan of care.    Recommendation  Discharge Recommendation: Home with supervision;Home with home health PT  PT - Next Visit Recommendation: 03/06/14  PT Frequency: 3-4x/wk    Treatment:     Time of treatment: Start Time: 1440 Stop Time: 1504  Time Calculation (min): 24 min  PT Received On: 03/04/14    Treatment # 2    Precautions  Weight Bearing Status: no restrictions  Other Precautions: falls    Patient's medical condition is appropriate for Physical Therapy intervention at this time.     Subjective: Patient reports it feels good to walk around.  Patient is agreeable to participation in the therapy session.  Patient Goal:  home   Pain Assessment  Pain Assessment: No/denies pain          Objective:  Patient is in bed with peripheral IV in place.    Cognition  Arousal/Alertness: Appropriate responses to stimuli  Orientation Level: Oriented X4  Following Commands: Follows all commands and directions without difficulty  Safety Awareness: independent  Insights: Fully aware of deficits    Functional Mobility  Rolling: Independent  Supine to Sit: Independent  Scooting to EOB: Independent  Sit to Supine: Independent  Sit to Stand: Independent  Stand to Sit: Independent    Transfers  Bed to Chair: Independent  Device Used for Functional Transfer:  (none)    Locomotion  Ambulation: stand by assistance  Ambulation Distance (Feet): 200 Feet  Pattern: decreased cadence  Stair Management: independently;two rails;alternating pattern  Number of Stairs: 4      Endurance  Patient able to ambulate 200 feet without rest breaks however demonstrates increased SOB (without complaint) and tachycardia (HR 130's).          Signature: Marcell Barlow MsPT, CCS  03/04/2014 3:14 PM

## 2014-03-04 NOTE — Progress Notes (Signed)
Maureen Vasquez is a 80 year old woman followed by Dr. Allena Katz with a history of  non-Hodgkin's lymphoma, who recently received R-CHOP chemotherapy.  She  presented with syncope, pancytopenia and fever.  She also has had a dry  cough.  She has been followed by infectious disease.  CT scan done this  morning shows ground-glass opacity in the left upper lung, but she is  symptomatically improving.  She does claim that she feels better today than  yesterday.     PHYSICAL EXAMINATION:  GENERAL:  She is a somewhat weak-appearing woman in no acute distress.  VITAL SIGNS:  Temperature max 100.7, current temperature 95.4, pulse 107,  respiratory rate 19, blood pressure 112/53.  LUNGS:  Clear.  HEART:  Regular rate and rhythm.  ABDOMEN:  Soft.  EXTREMITIES:  No peripheral edema.     LABORATORY DATA:  White count 7.24, hematocrit 25.7, platelet count 230.  CT scan as  described above.     MEDICATIONS:  Zosyn, vancomycin, Lovenox.     IMPRESSION AND PLAN:  1.  Non-Hodgkin lymphoma status post R-CHOP with evidence of count  recovery.  The patient has a followup appointment with Dr. Allena Katz next  Tuesday, at which time decisions regarding advisability of additional  therapy versus dose reduction will be discussed.  2.  Continued fever with findings on CT scan, infectious disease following.   They will determine length of intravenous antibiotics versus oral  antibiotics.  3.  Mild weakness.  The patient has been seen by physical therapy.

## 2014-03-05 ENCOUNTER — Other Ambulatory Visit: Payer: Self-pay

## 2014-03-05 LAB — BASIC METABOLIC PANEL
Anion Gap: 10 (ref 5.0–15.0)
BUN: 4 mg/dL — ABNORMAL LOW (ref 7–19)
CO2: 23 mEq/L (ref 22–29)
Calcium: 8.3 mg/dL (ref 7.9–10.2)
Chloride: 103 mEq/L (ref 100–111)
Creatinine: 0.6 mg/dL (ref 0.6–1.0)
Glucose: 87 mg/dL (ref 70–100)
Potassium: 3.6 mEq/L (ref 3.5–5.1)
Sodium: 136 mEq/L (ref 136–145)

## 2014-03-05 LAB — CBC AND DIFFERENTIAL
Hematocrit: 27.4 % — ABNORMAL LOW (ref 37.0–47.0)
Hgb: 9.3 g/dL — ABNORMAL LOW (ref 12.0–16.0)
MCH: 30.1 pg (ref 28.0–32.0)
MCHC: 33.9 g/dL (ref 32.0–36.0)
MCV: 88.7 fL (ref 80.0–100.0)
MPV: 10.7 fL (ref 9.4–12.3)
Platelets: 281 10*3/uL (ref 140–400)
RBC: 3.09 10*6/uL — ABNORMAL LOW (ref 4.20–5.40)
RDW: 15 % (ref 12–15)
WBC: 7.32 10*3/uL (ref 3.50–10.80)

## 2014-03-05 LAB — MAN DIFF ONLY
Band Neutrophils Absolute: 2.78 10*3/uL — ABNORMAL HIGH (ref 0.00–1.00)
Band Neutrophils: 38 %
Basophils Absolute Manual: 0.07 10*3/uL (ref 0.00–0.20)
Basophils Manual: 1 %
Eosinophils Absolute Manual: 0 10*3/uL (ref 0.00–0.70)
Eosinophils Manual: 0 %
Lymphocytes Absolute Manual: 0.22 10*3/uL — ABNORMAL LOW (ref 0.50–4.40)
Lymphocytes Manual: 3 %
Metamyelocytes Absolute: 0.07 10*3/uL — ABNORMAL HIGH
Metamyelocytes: 1 %
Monocytes Absolute: 0.81 10*3/uL (ref 0.00–1.20)
Monocytes Manual: 11 %
Myelocytes Absolute: 0.07 10*3/uL — ABNORMAL HIGH
Myelocytes: 1 %
Neutrophils Absolute Manual: 3.29 10*3/uL (ref 1.80–8.10)
Nucleated RBC: 0 /100 WBC (ref 0–1)
Segmented Neutrophils: 45 %

## 2014-03-05 LAB — CELL MORPHOLOGY
Cell Morphology: NORMAL
Platelet Estimate: NORMAL

## 2014-03-05 LAB — GFR: EGFR: >60

## 2014-03-05 LAB — PHOSPHORUS: Phosphorus: 3.8 mg/dL (ref 2.3–4.7)

## 2014-03-05 LAB — MAGNESIUM: Magnesium: 1.6 mg/dL (ref 1.6–2.6)

## 2014-03-05 MED ORDER — METOPROLOL SUCCINATE ER 25 MG PO TB24
25.0000 mg | ORAL_TABLET | Freq: Every day | ORAL | 0 refills | Status: DC
Start: 2014-03-05 — End: 2014-04-04
  Filled 2014-03-05: qty 30, 30d supply, fill #0

## 2014-03-05 MED ORDER — BENZONATATE 100 MG PO CAPS
100.0000 mg | ORAL_CAPSULE | Freq: Three times a day (TID) | ORAL | Status: DC | PRN
Start: 2014-03-05 — End: 2014-03-05
  Administered 2014-03-05: 100 mg via ORAL
  Filled 2014-03-05: qty 1

## 2014-03-05 MED ORDER — AMOXICILLIN-POT CLAVULANATE 875-125 MG PO TABS
1.0000 | ORAL_TABLET | Freq: Two times a day (BID) | ORAL | 0 refills | Status: AC
Start: 2014-03-05 — End: 2014-03-12
  Filled 2014-03-05: qty 14, 7d supply, fill #0

## 2014-03-05 MED ORDER — AMOXICILLIN-POT CLAVULANATE 875-125 MG PO TABS
1.0000 | ORAL_TABLET | Freq: Two times a day (BID) | ORAL | Status: DC
Start: 2014-03-05 — End: 2014-03-05
  Administered 2014-03-05: 1 via ORAL
  Filled 2014-03-05 (×2): qty 1

## 2014-03-05 MED ORDER — METOPROLOL TARTRATE 25 MG PO TABS
12.5000 mg | ORAL_TABLET | Freq: Two times a day (BID) | ORAL | Status: DC
Start: 2014-03-05 — End: 2014-03-05
  Administered 2014-03-05: 12.5 mg via ORAL
  Filled 2014-03-05: qty 1

## 2014-03-05 MED ORDER — BENZONATATE 100 MG PO CAPS
100.0000 mg | ORAL_CAPSULE | Freq: Three times a day (TID) | ORAL | 0 refills | Status: AC | PRN
Start: 2014-03-05 — End: 2014-04-04
  Filled 2014-03-05: qty 20, 7d supply, fill #0

## 2014-03-05 MED ORDER — HYDROCODONE-ACETAMINOPHEN 10-325 MG PO TABS
1.0000 | ORAL_TABLET | Freq: Four times a day (QID) | ORAL | Status: DC | PRN
Start: 2014-03-05 — End: 2014-03-05

## 2014-03-05 NOTE — Plan of Care (Signed)
Problem: Safety  Goal: Patient will be free from injury during hospitalization  Outcome: Progressing  Reviewed safety plan with patient. Understanding verbalized.  Call bell at bedside.  Bed alarm on. Hourly rounding maintained per unit protocol

## 2014-03-05 NOTE — Discharge Summary (Signed)
[facility]      Patient: Maureen Vasquez  Admission Date: 02/27/2014   DOB: 07/23/1934  Discharge Date: 03/05/2014    MRN: [id number]  Discharge Attending: Lamar Benes MD   Referring Physician: Mack Hook, MD  PCP: Mack Hook, MD       DISCHARGE SUMMARY     Discharge Information   Admission Diagnosis:   Recurrent syncope secondary to symptomatic anemia  Pneumonia     Discharge Diagnosis:   Patient Active Problem List    Diagnosis Date Noted   . Neutropenia 02/27/2014   . Syncope and collapse 02/27/2014   . NHL (non-Hodgkin's lymphoma) 02/27/2014   . Generalized weakness 02/27/2014   . Low back ache 11/16/2011        Admission Condition: poor   Discharge Condition: good  Functional Status: Patient is independent with mobility/ambulation, transfers, ADL's, IADL's.    Discharge Medications:     Medication List       As of 03/05/2014  5:00 PM      START taking these medications           amoxicillin-clavulanate 875-125 MG per tablet    Commonly known as: AUGMENTIN    Take 1 tablet by mouth every 12 (twelve) hours.        benzonatate 100 MG capsule    Commonly known as: TESSALON    Take 1 capsule (100 mg total) by mouth 3 (three) times daily as needed for Cough.        metoprolol XL 25 MG 24 hr tablet    Commonly known as: TOPROL-XL    Take 1 tablet (25 mg total) by mouth daily.         STOP taking these medications           acetaminophen 325 MG tablet    Commonly known as: TYLENOL        Hydrocodone-Acetaminophen 5-300 MG Tabs        HYDROcodone-acetaminophen 5-325 MG per tablet    Commonly known as: NORCO        HYDROmorphone 2 MG tablet    Commonly known as: DILAUDID               Where to get your medications     These are the prescriptions that you need to pick up.    You may get these medications from any pharmacy.           amoxicillin-clavulanate 875-125 MG per tablet    benzonatate 100 MG capsule    metoprolol XL 25 MG 24 hr tablet                       Hospital Course   Presentation  History   This is a 80 year old female with history of non-Hodgkin's lymphoma on chemotherapy, presenting with recurrent syncope.  The patient underwent her 4th round of chemotherapy on April 8. After chemo, she reported feeling generalized weakness and decreased appetite.  She states she tried to get up to the kitchen, but fell and lost consciousness.  She woke up on the floor and states that she think she syncopized a second time. She denies any limb shaking of bowel or bladder incontinence.  She has no history of seizures.  She denies any chest pain, palpitations, nausea, vomiting or any other complaints.    See HPI for details.    Hospital Course (6 Days)   The patient was admitted for  recurrent syncope.  This was thought to be secondary to symptomatic anemia. Her pancytopenia was 2/2 to her recent chemotherapy and she was also found to be dehydrated on admission.  She was monitored on telemetry; she did have episodes of sinus tachycardia but no arrhythmias.  She was sent for echo which showed normal LV size and function, aortic sclerosis without stenosis and mild mitral regurg.  The day after admission, she was transfused 1 unit of packed red blood cells for a hemoglobin of less than 8 with symptomatic anemia.  Posttransfusion her hemoglobin stabilized around 9 prior to discharge. Clinically, she felt well after the blood transfusion and also after treatment with IV fluids. Dr. Kandyce Rud from Oncology was consulted. The patient to follow-up with Dr. Allena Katz on discharge. The patient was also seen and examined by PT, they recommended home health PT    The patient was also noted to have low-grade temperatures as high as 100.9 during this admission.  ID Dr. Leodis Sias was consulted.  Urinalysis was negative.  She had a chest x-ray which showed bilateral infiltrates consistent with possible pneumonia.  She was initially started on meropenem on admission because of her recent chemotherapy and that she was neutropenic.  This  is changed to vancomycin and meropenem.  However, blood cultures showed no growth to date and prior to discharge, the patient was downgraded to Augmentin for an additional 7 days.     The patient did have an episode of atypical chest discomfort which has since resolved.  Serial troponins were mildly elevated at 0.12 but were stable.  Demand ischemia was strongly suspected in the setting of pneumonia.  The patient was not treated with aspirin because of an ALLERGY.  She had recently gone for a MUGA scan in January 2015 which was normal.  She was noted to have an ejection fraction of 56 percent and wall motions were grossly normal.  Since the chest pain resolved and was thought to be due to demand ischemia in the setting of pneumonia,  no further inpatient workup was indicated. The patient should follow closely with her primary care doctor and also her oncologist on discharge.    The patient was also noticed to be persistently tachycardia with heart rates in the 110s to 130s.  An EKG was checked, which showed sinus tachycardia.  Telemetry did not show any abnormal arrhythmias.  She was sent for echo which showed normal LV function.  She was also sent for CT angio, which was negative for PE.  Her thyroid was also within normal limits.  She was started on a low-dose beta blocker, both for pulse and blood pressure control.  The patient was clinically improving and is stable and medically ready for discharge home today with close outpatient follow-up. PT recommended home PT on discharge.    Procedures/Imaging:   XR CHEST AP PORTABLE    Final Result:  No active disease seen in the chest.         Georgann Housekeeper, MD     02/27/2014 2:40 PM       XR CHEST 2 VIEWS    Final Result:      Interval development of minimal bibasilar opacities. Continued follow-up    advised.        Wilmon Pali, MD     03/03/2014 10:23 AM       CT ANGIOGRAM CHEST    Final Result:      1. No CT scan evidence for detectable pulmonary embolism.  2.  Minimal groundglass opacity consolidation left upper lobe.    3. Minimal basilar opacities probably from atelectasis, with adjacent    trace pleural effusions.    4. Remainder as above.        Wilmon Pali, MD     03/04/2014 9:36 AM       ECHOCARDIOGRAM ADULT COMPLETE W CLR/ DOPP WAVEFORM    Final Result:       Normal LV size and function.        Aortic sclerosis.        Mild mitral regurgitation        Jodelle Gross, MD     02/28/2014 3:43 PM           Treatment Team:   Consulting Physician: Daylene Katayama, MD  Consulting Physician: Threasa Beards, MD  Consulting Physician: Shelle Iron, MD       Best Practices   Was the patient admitted with either a CHF Exacerbation or Pneumonia? No     Progress Note/Physical Exam at Discharge     Subjective: Patient is seen and examined this morning.  Reviewed patient's blood work and also her CT scan.  Patient states her cough is getting better.  States her shortness of breath is getting better.  She is ready for [place] home today    Filed Vitals:    03/04/14 2300 03/05/14 0433 03/05/14 0815 03/05/14 1153   BP: 127/61 132/67 125/55 113/56   Pulse: 124 125 101 90   Temp: 99.3 F (37.4 C) 98.2 F (36.8 C) 95.9 F (35.5 C) 97.9 F (36.6 C)   TempSrc: Oral      Resp: 19 20 16 16    Height:       Weight:       SpO2: 95% 94% 92% 94%       General: NAD, AAOx3  HEENT: perrla, eomi, sclera anicteric, OP: Clear, MMM  Neck: supple, FROM, no LAD  Cardiovascular: RRR, no m/r/g  Lungs: CTAB, no w/r/r  Abdomen: soft, +BS, NT/ND, no masses, no g/r  Extremities: no C/C/E  Skin: no rashes or lesions noted       Diagnostics     Labs/Studies Pending at Discharge: No    Last Labs     Lab 03/05/14 0631 03/04/14 0710 03/03/14 0615   WBC 7.32 7.24 5.96   RBC 3.09* 2.98* 3.01*   HGB 9.3* 9.2* 9.0*   HCT 27.4* 25.7* 25.8*   MCV 88.7 86.2 85.7   PLT 281 230 160         Lab 03/05/14 0631 03/04/14 0709 03/03/14 0615 03/02/14 0412 03/01/14 0527 02/27/14 1306   NA 136 133* 134* 133* 134* --   K 3.6 4.0  3.4* 3.8 3.4* --   CL 103 104 105 105 105 --   CO2 23 20* 21* 23 20* --   BUN 4* 4* 3* 3* 4* --   CREAT 0.6 0.6 0.5* 0.6 0.5* --   GLU 87 95 101* 90 89 --   CA 8.3 8.1 7.9 8.1 7.8* --   MG 1.6 1.6 1.6 -- -- 1.8        Patient Instructions   Discharge Diet: cardiac diet  Discharge Activity:  activity as tolerated    Follow Up Appointment:  Follow-up Information     Follow up with Mack Hook, MD. In 1 week.    Specialty: Family Medicine    Contact information:    [phone]  [address]  [address]  [place] [address]  [phone]          Follow up with Threasa Beards, MD. In 1 week.    Specialties: Infectious Disease, Internal Medicine    Contact information:    [address]  [address] [place] [address]  [phone]          Follow up with Shelle Iron, MD. In 1 week.    Specialties: Medical Oncology, Internal Medicine    Contact information:    [address]  [address]  [place] [address]  [phone]                 Time spent examining patient, discussing with patient/family regarding hospital course, chart review, reconciling medications and discharge planning: 40 minutes.    Signed,  Lamar Benes, MD  4:59 PM 03/05/2014

## 2014-03-05 NOTE — Progress Notes (Signed)
HEMATOLOGY ONCOLOGY PROGRESS NOTE    Date Time: 03/05/2014 1:39 PM  Patient Name: Maureen Vasquez, Maureen Vasquez    Principal Problem:   *Syncope and collapse  Active Problems:   Neutropenia   NHL (non-Hodgkin's lymphoma)   Generalized weakness      HISTORY:    Patient with NHL admitted with febrile neutropenia and pneumonia s/p RCHOP. Improving, cultures negative, now on PO augmentin. Still with cough and weakness.  Afebrile, WBC now 7.3        PHYSICAL EXAM:     Filed Vitals:    03/05/14 1153   BP: 113/56   Pulse: 90   Temp: 97.9 F (36.6 C)   Resp: 16   SpO2: 94%       Intake and Output Summary (Last 24 hours) at Date Time    Intake/Output Summary (Last 24 hours) at 03/05/14 1339  Last data filed at 03/05/14 0815   Gross per 24 hour   Intake      0 ml   Output    200 ml   Net   -200 ml         Appearance: in no acute distress  Skin: no rash  HEENT: no icterus, no oral moniliasis or mucositis  Lungs: clear to percussion and ausculation  Heart: regular rate and rhythm  Abdomen: no tenderness, no organomegaly or mass  Extremities: no edema  Neuro: alert, strength grossly intact        ASSESSMENT/PLAN:       1) Pneumonia: continue course of augmentin. Incentive spirometry  2)  Febrile neutropenia: resolved  3) NHL: to f/u with Dr. Allena Katz for additional treatment planning     Valda Lamb, MD  [facility]  [phone]    MEDS:   Scheduled Meds:  Current Facility-Administered Medications   Medication Dose Route Frequency   . amoxicillin-clavulanate  1 tablet Oral Q12H SCH   . enoxaparin  40 mg Subcutaneous Daily at 1800   . metoprolol  12.5 mg Oral Q12H SCH   . [DISCONTINUED] piperacillin-tazobactam  4.5 g Intravenous Q8H SCH   . [DISCONTINUED] vancomycin  1,000 mg Intravenous Q24H     Continuous Infusions:   PRN Meds:.acetaminophen, alum & mag hydroxide-simethicone, benzonatate, docusate sodium, HYDROcodone-acetaminophen, ondansetron, polyethylene glycol     LABS:     Results     Procedure Component Value  Units Date/Time    Manual Differential [[id number]]  (Abnormal) Collected:03/05/14 0631     Segmented Neutrophils 45 % Updated:03/05/14 0828     Band Neutrophils 38 %      Lymphocytes Manual 3 %      Monocytes Manual 11 %      Eosinophils Manual 0 %      Basophils Manual 1 %      Metamyelocytes 1 %      Myelocytes 1 %      Nucleated RBC 0 /100 WBC      Abs Seg Manual 3.29 x10 3/uL      Bands Absolute 2.78 (H) x10 3/uL      Absolute Lymph Manual 0.22 (L) x10 3/uL      Monocytes Absolute 0.81 x10 3/uL      Absolute Eos Manual 0.00 x10 3/uL      Absolute Baso Manual 0.07 x10 3/uL      Metamyelocytes Absolute 0.07 (H) x10 3/uL      Absolute Myelocyte 0.07 (H) x10 3/uL     Cell MorpHology [[id number]]  (Abnormal) Collected:03/05/14 0631  Cell Morphology: Normal Updated:03/05/14 0828     Toxic Granulation =1+ (A)      Platelet Estimate Normal     CBC and differential [[id number]]  (Abnormal) Collected:03/05/14 0631    Specimen Information:Blood / Blood Updated:03/05/14 0828     WBC 7.32 x10 3/uL      RBC 3.09 (L) x10 6/uL      Hgb 9.3 (L) g/dL      Hematocrit 40.9 (L) %      MCV 88.7 fL      MCH 30.1 pg      MCHC 33.9 g/dL      RDW 15 %      Platelets 281 x10 3/uL      MPV 10.7 fL     Basic Metabolic Panel [[id number]]  (Abnormal) Collected:03/05/14 0631    Specimen Information:Blood Updated:03/05/14 0728     Glucose 87 mg/dL      BUN 4 (L) mg/dL      Creatinine 0.6 mg/dL      CALCIUM 8.3 mg/dL      Sodium 956 mEq/L      Potassium 3.6 mEq/L      Chloride 103 mEq/L      CO2 23 mEq/L      Anion Gap 10.0     Magnesium [[id number]] Collected:03/05/14 0631    Specimen Information:Blood Updated:03/05/14 0728     Magnesium 1.6 mg/dL     Phosphorus [[id number]] Collected:03/05/14 0631    Specimen Information:Blood Updated:03/05/14 0728     Phosphorus 3.8 mg/dL     GFR [[id number]] [id number]:03/47/42 0631     EGFR >60.0 Updated:03/05/14 0728    Blood Culture Aerobic/Anaerobic #1 [[id number]] Collected:02/27/14 1505    Specimen  Information:Blood Updated:03/04/14 1907    Narrative:    ORDER#: [id number]                                    ORDERED BY: GENTRY, DAVID  SOURCE: Blood Arm                                    COLLECTED:  02/27/14 15:05  ANTIBIOTICS AT COLL.:                                RECEIVED :  02/27/14 16:52  Culture Blood Aerobic and Anaerobic        FINAL       03/04/14 19:03  03/04/14   No growth after 5 days of incubation.      Blood Culture Aerobic/Anaerobic #2 [[id number]] Collected:02/27/14 1456    Specimen Information:Blood Updated:03/04/14 1907    Narrative:    ORDER#: [id number]                                    ORDERED BY: GENTRY, DAVID  SOURCE: Blood mediport                               COLLECTED:  02/27/14 14:56  ANTIBIOTICS AT COLL.:  RECEIVED :  02/27/14 16:52  Culture Blood Aerobic and Anaerobic        FINAL       03/04/14 19:03  03/04/14   No growth after 5 days of incubation.      Culture Staph aureus and MRSA [place]an [[id number]] Collected:03/03/14 1402    Specimen Information:Nasal Swab Updated:03/04/14 1655    Narrative:    ORDER#: [id number]                                    ORDERED BY: Sissy Hoff  SOURCE: Nares                                        COLLECTED:  03/03/14 14:02  ANTIBIOTICS AT COLL.:                                RECEIVED :  03/03/14 16:54  Culture Staph aureus and MRSA Surveillance FINAL       03/04/14 16:55  03/04/14   No Staph aureus isolated and             No Methicillin Resistant Staph aureus isolated      CULTURE BLOOD AEROBIC AND ANAEROBIC [[id number]] Collected:03/03/14 1116    Specimen Information:Blood, Venipuncture Updated:03/04/14 1403    Narrative:    ORDER#: [id number]                                    ORDERED BY: RUTLEDGE, KATHE  SOURCE: Blood, Venipuncture peripheral               COLLECTED:  03/03/14 11:16  ANTIBIOTICS AT COLL.:                                RECEIVED :  03/03/14 13:50  Culture Blood Aerobic and Anaerobic        PRELIM       03/04/14 14:03  03/04/14   No Growth after 1 day/s of incubation.            IMAGING DATA:  Radiology Results (24 Hour)     ** No Results found for the last 24 hours. **

## 2014-03-05 NOTE — Discharge Instructions (Signed)
Home Health Discharge Information     Your doctor has ordered Skilled Nursing and Physical Therapy in-home service(s) for you while you recuperate at home, to assist you in the transition from hospital to home.      The agency that you or your representative chose to provide the service:  Name of Home Health Agency: [facility] [phone]      [employer]:  Name of DME Company: [address] [employer] [place] [phone]]  Equipment Ordered:  Dan Humphreys- delivered to bedside           The above services were set up by:  Florentina Jenny  [facility] Liaison)   Phone [phone]

## 2014-03-05 NOTE — Progress Notes (Signed)
[facility] ID PROGRESS NOTE    Date Time: 03/05/2014 7:48 AM  Patient Name: Maureen Vasquez, Maureen Vasquez      Assessment:   Pneumonia   Non Hodgkin's Lymphoma   S/P chemotherapy   Pancytopenia   Plan:    CT chest reveals ground glass opacity Left upper Lobe and basilar opacities lower lobes   Symptomatically improving   WBC now recovered  Afebrile  Cultures neg to date  mrsa screen neg  Urine ag's neg  Transition to po augmentin now; vanco and zosyn d/c'd  If tolerates regimen, may complete a 7 day course of augmentin as outpt empirically for pneumonia  Ok ID standpoint for d/c home later today  Continue excellent nursing supportative       Subjective:   Comfortable  No new complaints  Dry cough persists    Medications:     Current Facility-Administered Medications   Medication Dose Route Frequency   . amoxicillin-clavulanate  1 tablet Oral Q12H SCH   . enoxaparin  40 mg Subcutaneous Daily at 1800   . [COMPLETED] furosemide  20 mg Oral Once   . [DISCONTINUED] furosemide  20 mg Intravenous Once   . [DISCONTINUED] piperacillin-tazobactam  4.5 g Intravenous Q8H SCH   . [DISCONTINUED] vancomycin  1,000 mg Intravenous Q24H       Review of Systems:   A comprehensive review of systems was: Negative except cough; fever resolved    Physical Exam:     Filed Vitals:    03/05/14 0433   BP: 132/67   Pulse: 125   Temp: 98.2 F (36.8 C)   Resp: 20   SpO2: 94%       Intake and Output Summary (Last 24 hours) at Date Time  No intake or output data in the 24 hours ending 03/05/14 0748    General appearance - alert, well appearing, and in no distress  Mental status - alert, oriented to person, place, and time  Eyes - pupils equal and reactive, extraocular eye movements intact  Ears - bilateral TM's and external ear canals normal  Nose - normal and patent, no erythema, discharge or polyps  Mouth - mucous membranes moist, pharynx normal without lesions  Neck - supple, no significant adenopathy  Lymphatics - no palpable lymphadenopathy  Chest - clear  anteriorly  Heart - normal rate, regular rhythm, normal S1, S2, no murmurs, rubs, clicks or gallops  Abdomen - soft, nontender, nondistended, no masses or organomegaly  Rectal - deferred, not clinically indicated  Back exam - full range of motion, no tenderness, palpable spasm or pain on motion  Neurological - alert, oriented, normal speech, no focal findings or movement disorder noted  Musculoskeletal - no joint tenderness, deformity or swelling  Extremities - peripheral pulses normal, no pedal edema, no clubbing or cyanosis  Skin - normal coloration and turgor, no rashes, no suspicious skin lesions noted      Labs:     Results     Procedure Component Value Units Date/Time    Basic Metabolic Panel [[id number]]  (Abnormal) Collected:03/05/14 0631    Specimen Information:Blood Updated:03/05/14 0728     Glucose 87 mg/dL      BUN 4 (L) mg/dL      Creatinine 0.6 mg/dL      CALCIUM 8.3 mg/dL      Sodium 409 mEq/L      Potassium 3.6 mEq/L      Chloride 103 mEq/L      CO2 23 mEq/L  Anion Gap 10.0     Magnesium [[id number]] Collected:03/05/14 0631    Specimen Information:Blood Updated:03/05/14 0728     Magnesium 1.6 mg/dL     Phosphorus [[id number]] Collected:03/05/14 0631    Specimen Information:Blood Updated:03/05/14 0728     Phosphorus 3.8 mg/dL     GFR [[id number]] YQMVHQION:62/95/28 0631     EGFR >60.0 Updated:03/05/14 0728    CBC and differential [[id number]]  (Abnormal) Collected:03/05/14 0631    Specimen Information:Blood / Blood Updated:03/05/14 0715     WBC 7.32 x10 3/uL      RBC 3.09 (L) x10 6/uL      Hgb 9.3 (L) g/dL      Hematocrit 27.2 (L) %      MCV 88.7 fL      MCH 30.1 pg      MCHC 33.9 g/dL      RDW 15 %      Platelets 281 x10 3/uL      MPV 10.7 fL     Blood Culture Aerobic/Anaerobic #1 [[id number]] Collected:02/27/14 1505    Specimen Information:Blood Updated:03/04/14 1907    Narrative:    ORDER#: [id number]                                    ORDERED BY: GENTRY, DAVID  SOURCE: Blood Arm                                     COLLECTED:  02/27/14 15:05  ANTIBIOTICS AT COLL.:                                RECEIVED :  02/27/14 16:52  Culture Blood Aerobic and Anaerobic        FINAL       03/04/14 19:03  03/04/14   No growth after 5 days of incubation.      Blood Culture Aerobic/Anaerobic #2 [[id number]] Collected:02/27/14 1456    Specimen Information:Blood Updated:03/04/14 1907    Narrative:    ORDER#: [id number]                                    ORDERED BY: GENTRY, DAVID  SOURCE: Blood mediport                               COLLECTED:  02/27/14 14:56  ANTIBIOTICS AT COLL.:                                RECEIVED :  02/27/14 16:52  Culture Blood Aerobic and Anaerobic        FINAL       03/04/14 19:03  03/04/14   No growth after 5 days of incubation.      Culture Staph aureus and MRSA [place]an [[id number]] Collected:03/03/14 1402    Specimen Information:Nasal Swab Updated:03/04/14 1655    Narrative:    ORDER#: [id number]                                    ORDERED BY: Sissy Hoff  SOURCE: Nares                                        COLLECTED:  03/03/14 14:02  ANTIBIOTICS AT COLL.:                                RECEIVED :  03/03/14 16:54  Culture Staph aureus and MRSA Surveillance FINAL       03/04/14 16:55  03/04/14   No Staph aureus isolated and             No Methicillin Resistant Staph aureus isolated      CULTURE BLOOD AEROBIC AND ANAEROBIC [[id number]] Collected:03/03/14 1116    Specimen Information:Blood, Venipuncture Updated:03/04/14 1403    Narrative:    ORDER#: [id number]                                    ORDERED BY: RUTLEDGE, KATHE  SOURCE: Blood, Venipuncture peripheral               COLLECTED:  03/03/14 11:16  ANTIBIOTICS AT COLL.:                                RECEIVED :  03/03/14 13:50  Culture Blood Aerobic and Anaerobic        PRELIM      03/04/14 14:03  03/04/14   No Growth after 1 day/s of incubation.      Basic Metabolic Panel [[id number]]  (Abnormal) Collected:03/04/14 0709    Specimen Information:Blood  Updated:03/04/14 0813     Glucose 95 mg/dL      BUN 4 (L) mg/dL      Creatinine 0.6 mg/dL      CALCIUM 8.1 mg/dL      Sodium 725 (L) mEq/L      Potassium 4.0 mEq/L      Chloride 104 mEq/L      CO2 20 (L) mEq/L      Anion Gap 9.0     Magnesium [[id number]] Collected:03/04/14 0709    Specimen Information:Blood Updated:03/04/14 0813     Magnesium 1.6 mg/dL     Phosphorus [[id number]] Collected:03/04/14 0709    Specimen Information:Blood Updated:03/04/14 0813     Phosphorus 2.5 mg/dL     GFR [[id number]] OACZYSAYT:01/60/10 0709     EGFR >60.0 Updated:03/04/14 0813          Recent CBC   Recent Labs   Basename 03/05/14 0631    RBC 3.09*    HGB 9.3*    HCT 27.4*    MCV 88.7    MCH 30.1    MCHC 33.9    RDW 15    MPV 10.7    LABPLAT --     Recent BMP   Recent Labs   Basename 03/05/14 0631    GLU 87    BUN 4*    CREAT 0.6    CA 8.3    NA 136    K 3.6    CL 103    CO2 23       Rads:   CT Angiogram Chest [[id number]] Collected:03/04/14 2025 Order Status:Completed Updated:03/04/14 4270 Narrative: Reason for exam: 80 year old female with tachycardia and chest pain.  Evaluate for  pulmonary embolism.    TECHNIQUE: Spiral CT angiography after IV contrast. 80 cc Omnipaque 350  administered. MIPS generated.    FINDINGS:  There are no detectable pulmonary emboli. The pulmonary arteries are  normal caliber. Heart is normal size. Aorta reveals no focal aneurysm or  dissection.    The lungs reveal minimal groundglass opacity consolidation in the left  upper lobe, and there are minimal basilar opacities probably from  atelectasis. There are adjacent trace pleural effusions. There is no  pneumothorax. In the mediastinum, there is no significant  lymphadenopathy or pericardial effusion. There is a small hiatal hernia.    Mild arthritis is seen throughout the spine.  Impression:   1. No CT scan evidence for detectable pulmonary embolism.  2. Minimal groundglass opacity consolidation left upper lobe.  3. Minimal basilar opacities probably from  atelectasis, with adjacent  trace pleural effusions.  4. Remainder as above.    Wilmon Pali, MD   03/04/2014 9:36 AM  XR Chest 2 Views [[id number]] Collected:03/03/14 1020 Order Status:Completed Updated:03/03/14 1028 Narrative: Reason for exam: 80 year old female with fever. Exam compared prior  study 02/27/2014.    FINDINGS:  2 views.  There has been interval development of minimal bibasilar opacities.  Heart remains normal size. Right venous catheter is unchanged with tip  in the SVC. There is no pneumothorax. Bony thorax reveals mild  osteopenia.  Impression:   Interval development of minimal bibasilar opacities. Continued follow-up  advised.    Wilmon Pali, MD   03/03/2014 10:23 AM  Chest AP Portable [[id number]] Collected:02/27/14 1439 Order Status:Completed Updated:02/27/14 1444 Narrative: History: Syncope.    FINDINGS: Portable AP view of the chest was obtained and compared to  prior study dated 08/19/2013. There is a normal cardiomediastinal  silhouette. The lungs are clear without focal consolidation. No  pneumothorax or pleural effusion is seen. There is a right chest wall  venous port with its tip at the cavoatrial junction.  Impression: No active disease seen in the chest.    Georgann Housekeeper, MD   02/27/2014 2:40 PM        Signed by: Threasa Beards

## 2014-03-05 NOTE — Progress Notes (Signed)
Pt d/c to home with walker and home health. IV's removed per protocol. VSS. Pt and husband educated and stated understanding. Awaiting medication from pharmacy.

## 2014-03-05 NOTE — PT Progress Note (Signed)
Physical Therapy Note    [facility]  Physical Therapy Treatment    Patient:  Maureen Vasquez MRN#:  [id number]  Unit:  [facility] Room/Bed:  W102/V253-66    Assessment:   After today's treatment patient able to ambulate with RW with modified independence.  Assessment: Decreased balance;Decreased LE strength;Decreased endurance/activity tolerance, resulting in continued difficulty with endurance and balance without AD.  Prognosis: Good    Interdisciplinary Communication:   Patient up in bed and call bell within reach. Communicated with RN    Education:   Further education provided to patient , safety with mobility and ADLs, home safety.  Demonstrated good understanding with the above.      Plan:   Goals  Pt Will Perform Sit to Stand: Goal met  Plan  Risks/Benefits/POC Discussed with Pt/Family: With patient  Treatment/Interventions: Gait training;Functional transfer training;LE strengthening/ROM  PT Frequency: 3-4x/wk    Continue plan of care.    Recommendation  Discharge Recommendation: Home with supervision  DME Recommended for Discharge: Front wheel walker  PT - Next Visit Recommendation: 03/06/14  PT Frequency: 3-4x/wk    Treatment:     Time of treatment: Start Time: 1100 Stop Time: 1135  Time Calculation (min): 35 min  PT Received On: 03/05/14    Treatment #      Precautions  Weight Bearing Status: no restrictions  Other Precautions: falls    Patient's medical condition is appropriate for Physical Therapy intervention at this time.     Subjective: "i am feeling a little weak."  Patient is agreeable to participation in the therapy session. Nursing clears patient for therapy.      Pain Assessment  Pain Assessment: No/denies pain          Objective:  Patient is in bed with telemetry in place.    Cognition  Arousal/Alertness: Appropriate responses to stimuli  Orientation Level: Oriented X4  Functional Mobility  Supine to Sit: Independent  Sit to Supine: Independent  Sit to Stand: Independent  Stand to  Sit: Independent     Locomotion  Ambulation: modified independent (device);with front-wheeled walker  Ambulation Distance (Feet): 300 Feet  Stair Management: independently  Number of Stairs: 4               Signature: Marinus Maw, PT

## 2014-03-05 NOTE — Progress Notes (Signed)
Home Health Referral          Referral from [facility] (Case Manager) for home health care upon discharge.    By [employer], the patient has the right to freely choose a home care provider.  Arrangements have been made with:     A company of the patients choosing. We have supplied the patient with a listing of providers in your area who asked to be included and participate in Medicare.   [facility], a home care agency that provides both adult home care services which is a wholly owned and operated by [employer] and participates in [employer]   The preferred provider of your insurance company. Choosing a home care provider other than your insurance company's preferred provider may affect your insurance coverage.    The Home Health Care Referral Form acknowledging the voluntary selection of the home care company has been completed, signed, and is on file.      Home Health Discharge Information     Your doctor has ordered Skilled Nursing and Physical Therapy in-home service(s) for you while you recuperate at home, to assist you in the transition from hospital to home.      The agency that you or your representative chose to provide the service:  Name of Home Health Agency: [facility] [phone]      [employer]:  Name of DME Company: [address] [employer] [place] [phone]]  Equipment Ordered:  Dan Humphreys- delivered to bedside           The above services were set up by:  Florentina Jenny  [facility] Liaison)   Phone [phone]                                            Additional comments: Pt agrees with [facility] arrangements      Signed by: Florentina Jenny  Date Time: 03/05/2014 2:27 PM

## 2014-03-25 ENCOUNTER — Emergency Department: Payer: Medicare Other

## 2014-03-25 ENCOUNTER — Inpatient Hospital Stay
Admission: EM | Admit: 2014-03-25 | Discharge: 2014-03-28 | DRG: 808 | Disposition: A | Discharge status: Home Health | Payer: Medicare Other | Attending: Internal Medicine | Admitting: Internal Medicine

## 2014-03-25 ENCOUNTER — Inpatient Hospital Stay: Payer: Medicare Other | Admitting: Internal Medicine

## 2014-03-25 DIAGNOSIS — R5081 Fever presenting with conditions classified elsewhere: Secondary | ICD-10-CM | POA: Diagnosis present

## 2014-03-25 DIAGNOSIS — C8589 Other specified types of non-Hodgkin lymphoma, extranodal and solid organ sites: Secondary | ICD-10-CM | POA: Diagnosis present

## 2014-03-25 DIAGNOSIS — IMO0002 Reserved for concepts with insufficient information to code with codable children: Secondary | ICD-10-CM

## 2014-03-25 DIAGNOSIS — E43 Unspecified severe protein-calorie malnutrition: Secondary | ICD-10-CM | POA: Diagnosis present

## 2014-03-25 DIAGNOSIS — D702 Other drug-induced agranulocytosis: Principal | ICD-10-CM | POA: Diagnosis present

## 2014-03-25 DIAGNOSIS — T451X5A Adverse effect of antineoplastic and immunosuppressive drugs, initial encounter: Secondary | ICD-10-CM | POA: Diagnosis present

## 2014-03-25 DIAGNOSIS — I10 Essential (primary) hypertension: Secondary | ICD-10-CM | POA: Diagnosis present

## 2014-03-25 DIAGNOSIS — E871 Hypo-osmolality and hyponatremia: Secondary | ICD-10-CM | POA: Diagnosis present

## 2014-03-25 DIAGNOSIS — J189 Pneumonia, unspecified organism: Secondary | ICD-10-CM | POA: Diagnosis present

## 2014-03-25 DIAGNOSIS — R630 Anorexia: Secondary | ICD-10-CM | POA: Diagnosis present

## 2014-03-25 DIAGNOSIS — K5900 Constipation, unspecified: Secondary | ICD-10-CM | POA: Diagnosis present

## 2014-03-25 HISTORY — DX: Mantle cell lymphoma, lymph nodes of inguinal region and lower limb: C83.15

## 2014-03-25 LAB — MAN DIFF ONLY
Band Neutrophils Absolute: 0.23 10*3/uL (ref 0.00–1.00)
Band Neutrophils: 22 %
Basophils Absolute Manual: 0 10*3/uL (ref 0.00–0.20)
Basophils Manual: 0 %
Eosinophils Absolute Manual: 0.03 10*3/uL (ref 0.00–0.70)
Eosinophils Manual: 3 %
Lymphocytes Absolute Manual: 0.16 10*3/uL — ABNORMAL LOW (ref 0.50–4.40)
Lymphocytes Manual: 15 %
Monocytes Absolute: 0.04 10*3/uL (ref 0.00–1.20)
Monocytes Manual: 4 %
Neutrophils Absolute Manual: 0.59 10*3/uL — ABNORMAL LOW (ref 1.80–8.10)
Nucleated RBC: 0 /100 WBC (ref 0–1)
Segmented Neutrophils: 56 %

## 2014-03-25 LAB — BASIC METABOLIC PANEL
Anion Gap: 12 (ref 5.0–15.0)
BUN: 11 mg/dL (ref 7–19)
CO2: 21 mEq/L — ABNORMAL LOW (ref 22–29)
Calcium: 8.9 mg/dL (ref 7.9–10.2)
Chloride: 95 mEq/L — ABNORMAL LOW (ref 100–111)
Creatinine: 0.7 mg/dL (ref 0.6–1.0)
Glucose: 128 mg/dL — ABNORMAL HIGH (ref 70–100)
Potassium: 3.8 mEq/L (ref 3.5–5.1)
Sodium: 128 mEq/L — ABNORMAL LOW (ref 136–145)

## 2014-03-25 LAB — CBC AND DIFFERENTIAL
Hematocrit: 27.3 % — ABNORMAL LOW (ref 37.0–47.0)
Hgb: 9.2 g/dL — ABNORMAL LOW (ref 12.0–16.0)
MCH: 29.6 pg (ref 28.0–32.0)
MCHC: 33.7 g/dL (ref 32.0–36.0)
MCV: 87.8 fL (ref 80.0–100.0)
MPV: 10.6 fL (ref 9.4–12.3)
Platelets: 126 10*3/uL — ABNORMAL LOW (ref 140–400)
RBC: 3.11 10*6/uL — ABNORMAL LOW (ref 4.20–5.40)
RDW: 15 % (ref 12–15)
WBC: 1.06 10*3/uL — ABNORMAL LOW (ref 3.50–10.80)

## 2014-03-25 LAB — CELL MORPHOLOGY
Cell Morphology: NORMAL
Platelet Estimate: DECREASED — AB

## 2014-03-25 LAB — LIPASE: Lipase: 10 U/L (ref 8–78)

## 2014-03-25 LAB — CK: Creatine Kinase (CK): 17 U/L — ABNORMAL LOW (ref 29–168)

## 2014-03-25 LAB — COMPREHENSIVE METABOLIC PANEL ADD ON
ALT: 8 U/L (ref 0–55)
AST (SGOT): 19 U/L (ref 5–34)
Albumin/Globulin Ratio: 1.5 (ref 0.9–2.2)
Albumin: 3.4 g/dL — ABNORMAL LOW (ref 3.5–5.0)
Alkaline Phosphatase: 82 U/L (ref 37–106)
Bilirubin, Total: 0.9 mg/dL (ref 0.2–1.2)
Globulin: 2.2 g/dL (ref 2.0–3.6)
Protein, Total: 5.6 g/dL — ABNORMAL LOW (ref 6.0–8.3)

## 2014-03-25 LAB — TROPONIN I: Troponin I: 0.11 ng/mL — ABNORMAL HIGH (ref 0.00–0.09)

## 2014-03-25 LAB — GFR: EGFR: >60

## 2014-03-25 MED ORDER — SODIUM CHLORIDE 0.9 % IV SOLN
4.5000 mg | Freq: Once | INTRAVENOUS | Status: AC
Start: 2014-03-25 — End: 2014-03-26
  Administered 2014-03-26: 4.5 mg via INTRAVENOUS
  Filled 2014-03-25: qty 0.02

## 2014-03-25 MED ORDER — SODIUM CHLORIDE 0.9 % IV BOLUS
500.0000 mL | Freq: Once | INTRAVENOUS | Status: AC
Start: 2014-03-25 — End: 2014-03-25
  Administered 2014-03-25: 500 mL via INTRAVENOUS

## 2014-03-25 MED ORDER — LEVOFLOXACIN IN D5W 750 MG/150ML IV SOLN
750.0000 mg | Freq: Once | INTRAVENOUS | Status: AC
Start: 2014-03-25 — End: 2014-03-26
  Administered 2014-03-25: 750 mg via INTRAVENOUS
  Filled 2014-03-25: qty 150

## 2014-03-25 NOTE — ED Notes (Signed)
Bed: A12  Expected date:   Expected time:   Means of arrival: [id number] [place]  Comments:  Fever/Near Syncope

## 2014-03-25 NOTE — ED Provider Notes (Signed)
Physician/Midlevel provider first contact with patient: 03/25/14 2037         History     Chief Complaint   Patient presents with   . Fever   . Near syncope     Maureen Vasquez is a 80 y.o. female with last chemo as 6 days ago , presents with fever and near syncopal episode while getting out of the shower.  Husband reports the patient has been fatigued since her last chemotherapy 6 days ago.  Her temperature has varied between 98.6 and 100 degrees Fahrenheit today.  In reports the patient was getting out of the shower and gradually sank to the floor.  There was a questionable loss of consciousness.  Patient currently with only some mild abdominal pain that she attributed to constipation.    Onc:  Dipti Patel      Past Medical History   Diagnosis Date   . H/O acute pancreatitis 1990's   . Back pain    . Seasonal allergic rhinitis    . NHL (non-Hodgkin's lymphoma) October 2013     w/ Splenic Nodules   . Lymphona, mantle cell, inguinal region/lower limb        Past Surgical History   Procedure Laterality Date   . Appendectomy       Age of 61   . Excision, squamous cell       Right hand/ 2000   . Placement, mediport         Family History   Problem Relation Age of Onset   . Heart attack Mother    . Heart disease Mother    . Parkinsonism Mother    . Hyperlipidemia Mother    . Heart disease Sister    . Heart disease Brother        Social  History   Substance Use Topics   . Smoking status: Never Smoker    . Smokeless tobacco: Not on file   . Alcohol Use: No       .     Allergies   Allergen Reactions   . Aspirin Other (See Comments)     Oral and stomach irritation if taken daily   . Codeine Nausea Only   . Salicylates        Current Discharge Medication List      CONTINUE these medications which have NOT CHANGED    Details   docusate sodium (COLACE) 100 MG capsule Take 100 mg by mouth 2 (two) times daily.      metoprolol XL (TOPROL-XL) 25 MG 24 hr tablet Take 1 tablet (25 mg total) by mouth daily.  Qty: 30 tablet, Refills:  0      benzonatate (TESSALON) 100 MG capsule Take 1 capsule (100 mg total) by mouth 3 (three) times daily as needed for Cough.  Qty: 20 capsule, Refills: 0              Review of Systems   Constitutional: Positive for fever and fatigue.   HENT: Negative.    Eyes: Negative.    Respiratory: Positive for cough.    Cardiovascular: Negative.    Endocrine: Negative.    Genitourinary: Negative.    Musculoskeletal: Negative.    Allergic/Immunologic: Negative.    Neurological: Negative.    Psychiatric/Behavioral: Negative.    All other systems reviewed and are negative.      Physical Exam    BP: 119/56 mmHg, Heart Rate: 91, Temp: 97.9 F (36.6 C), Resp Rate: 18, SpO2: 96 %,  Weight: 49.896 kg     Physical Exam    CONSTITUTIONAL: Vital signs reviewed, Well appearing, Alert and oriented X 3.  HEAD: Atraumatic, Normocephalic.  EYES: Eyes are normal to inspection, Sclera are normal, Conjunctiva are normal.  ENT: Ears normal to inspection, Nose examination normal.  NECK: Normal ROM.  RESPIRATORY CHEST: Breath sounds normal, No respiratory distress.  CARDIOVASCULAR: RRR, No murmurs, Normal S1 S2.  ABDOMEN: Abdomen is nontender, No distension.  UPPER EXTREMITY: Inspection normal, Normal range of motion.  NEURO: GCS Is 15, No focal motor deficits.  SKIN: Skin is warm, Skin Is dry.  PSYCHIATRIC: Oriented X 3, Normal affect.      MDM and ED Course     ED Medication Orders    Start     Status Ordering Provider    03/25/14 2303  piperacillin-tazobactam (ZOSYN) 4.5 mg in sodium chloride 0.9 % 100 mL IVPB   Once     Route: Intravenous  Ordered Dose: 4.5 mg     Last MAR action:  New Bag Ander Purpura R    03/25/14 2303  levofloxacin (LEVAQUIN) 750mg  in D5W IVPB (premix)   Once     Route: Intravenous  Ordered Dose: 750 mg     Last MAR action:  New [address] Ander Purpura R    03/25/14 2054  sodium chloride 0.9 % bolus 500 mL   Once     Route: Intravenous  Ordered Dose: 500 mL     Last MAR action:  Stopped Venancio Poisson            MDM      Procedures    Clinical Impression & Disposition     Clinical Impression  Final diagnoses:   Pneumonia        ED Disposition    Admit Bed Type: Telemetry [5]  Bed request comments: Pneumonia  Admitting Physician: Lenox Ponds [[id number]]  Patient Class: Inpatient [101]             Current Discharge Medication List                Data Review     Nursing records reviewed and agree: Yes    Laboratory results reviewed by ED provider (yes/no): Yes    Radiologic study results reviewed by ED provider (yes/no): Yes    Rendering Provider: Venancio Poisson    Diagnostic Study Results     Labs  Results    Procedure Component Value Units Date/Time    Folate [[id number]] Collected:  03/26/14 1145    Specimen Information:  Blood Updated:  03/26/14 1621     Folate 4.2 ng/mL     Vitamin B12 [[id number]]  (Abnormal) Collected:  03/26/14 1145    Specimen Information:  Blood Updated:  03/26/14 1615     Vitamin B-12 >2000 (H) pg/mL     Ferritin [[id number]]  (Abnormal) Collected:  03/26/14 1145    Specimen Information:  Blood Updated:  03/26/14 1607     Ferritin 1419.27 (H) ng/mL     IRON PROFILE [[id number]]  (Abnormal) Collected:  03/26/14 1145     Iron 64 ug/dL Updated:  74/25/95 6387     UIBC 142 ug/dL      TIBC 564 (L) ug/dL      Iron Saturation 31 %     Hemolysis index [[id number]] Collected:  03/26/14 1145     Hemolysis Index 5 Updated:  03/26/14 1546    Reticulocytes [[id number]]  (Abnormal) Collected:  03/26/14  1144    Specimen Information:  Blood Updated:  03/26/14 1208     Reticulocyte Count Automated 0.1 (L) %      Retic Ct Abs 0.0040 (L) x10 6/uL      Immature Retic Fract 0.0 (L) %      Immature Plt Fraction 4.6 %      Retic Hgb 30.2 pg     CBC without differential [[id number]]  (Abnormal) Collected:  03/26/14 0628    Specimen Information:  Blood / Blood Updated:  03/26/14 0836     WBC 0.72 (L) x10 3/uL      RBC 2.65 (L) x10 6/uL      Hgb 7.7 (L) g/dL      Hematocrit 40.9 (L) %      MCV 86.4 fL      MCH 29.1 pg      MCHC  33.6 g/dL      RDW 15 %      Platelets 94 (L) x10 3/uL      MPV 10.6 fL      Nucleated RBC 0 /100 WBC     Narrative:      Starting for 1 Occurrences    Comprehensive metabolic panel [[id number]]  (Abnormal) Collected:  03/26/14 0628    Specimen Information:  Blood Updated:  03/26/14 0721     Glucose 130 (H) mg/dL      BUN 7 mg/dL      Creatinine 0.5 (L) mg/dL      Sodium 956 (L) mEq/L      Potassium 4.0 mEq/L      Chloride 106 mEq/L      CO2 23 mEq/L      CALCIUM 8.2 mg/dL      Protein, Total 4.5 (L) g/dL      Albumin 2.7 (L) g/dL      AST (SGOT) 12 U/L      ALT <6 U/L      Alkaline Phosphatase 65 U/L      Bilirubin, Total 0.6 mg/dL      Globulin 1.8 (L) g/dL      Albumin/Globulin Ratio 1.5      Anion Gap 6.0     Narrative:      Starting for 1 Occurrences    GFR [[id number]] Collected:  03/26/14 0628     EGFR >60.0 Updated:  03/26/14 4696    Narrative:      Starting for 1 Occurrences    Urine culture [[id number]] Collected:  03/26/14 0311    Specimen Information:  Urine / Urine, Clean Catch Updated:  03/26/14 0537    UA, Reflex to Microscopic [[id number]] Collected:  03/26/14 0311    Specimen Information:  Urine Updated:  03/26/14 0323     Urine Type Clean Catch      Color, UA Yellow      Clarity, UA Clear      Specific Gravity UA 1.009      Urine pH 7.0      Leukocyte Esterase, UA Negative      Nitrite, UA Negative      Protein, UR Negative      Glucose, UA Negative      Ketones UA Negative      Urobilinogen, UA Normal mg/dL      Bilirubin, UA Negative      Blood, UA Negative         Labs Reviewed   BASIC METABOLIC PANEL - Abnormal; Notable for the following:     Glucose 128 (*)  Sodium 128 (*)     Chloride 95 (*)     CO2 21 (*)     All other components within normal limits   CBC AND DIFFERENTIAL - Abnormal; Notable for the following:     WBC 1.06 (*)     RBC 3.11 (*)     Hgb 9.2 (*)     Hematocrit 27.3 (*)     Platelets 126 (*)     All other components within normal limits   COMPREHENSIVE METABOLIC PANEL ADD ON -  Abnormal; Notable for the following:     Protein, Total 5.6 (*)     Albumin 3.4 (*)     All other components within normal limits   CK - Abnormal; Notable for the following:     Creatine Kinase (CK) 17 (*)     All other components within normal limits   TROPONIN I - Abnormal; Notable for the following:     Troponin I 0.11 (*)     All other components within normal limits   MANUAL DIFFERENTIAL - Abnormal; Notable for the following:     Abs Seg Manual 0.59 (*)     Absolute Lymph Manual 0.16 (*)     All other components within normal limits   CELL MORPHOLOGY - Abnormal; Notable for the following:     Hypersegmentation =1+ (*)     Toxic Granulation =1+ (*)     Platelet Estimate Decreased (*)     Giant Platelets Present (*)     All other components within normal limits   COMPREHENSIVE METABOLIC PANEL - Abnormal; Notable for the following:     Glucose 130 (*)     Creatinine 0.5 (*)     Sodium 135 (*)     Protein, Total 4.5 (*)     Albumin 2.7 (*)     Globulin 1.8 (*)     All other components within normal limits    Narrative:     Starting for 1 Occurrences   CBC - Abnormal; Notable for the following:     WBC 0.72 (*)     RBC 2.65 (*)     Hgb 7.7 (*)     Hematocrit 22.9 (*)     Platelets 94 (*)     All other components within normal limits    Narrative:     Starting for 1 Occurrences   IRON PROFILE - Abnormal; Notable for the following:     TIBC 206 (*)     All other components within normal limits   FERRITIN - Abnormal; Notable for the following:     Ferritin 1419.27 (*)     All other components within normal limits   VITAMIN B12 - Abnormal; Notable for the following:     Vitamin B-12 >2000 (*)     All other components within normal limits   RETICULOCYTES - Abnormal; Notable for the following:     Reticulocyte Count Automated 0.1 (*)     Retic Ct Abs 0.0040 (*)     Immature Retic Fract 0.0 (*)     All other components within normal limits   CULTURE BLOOD AEROBIC AND ANAEROBIC    Narrative:     1 BLUE+1 PURPLE   CULTURE  BLOOD AEROBIC AND ANAEROBIC    Narrative:     1 BLUE+1 PURPLE   URINE CULTURE   GFR   URINALYSIS, REFLEX TO MICROSCOPIC EXAM IF INDICATED   LIPASE   GFR    Narrative:  Starting for 1 Occurrences   FOLATE   HEMOLYSIS INDEX   GFR   COMPREHENSIVE METABOLIC PANEL   CBC   GFR         Radiologic Studies  Radiology Results (24 Hour)    ** No results found for the last 24 hours. **              Critical Care     Critical Care Time: No Critical Care Time  Critical Care Justification (Severity of Illness): None  Critical Care Justification (Intervention): None  Excluding procedure time: yes    Clinical Course / MDM     Working Differential (not completely inclusive):   GOLDIE TREGONING is a 80 y.o. female who presents with near syncope and fever.  Will workup patient and most likely admit.    Notes:   2318:  D/w dr patel.  Will admit for fever, syncope and pneumonia.    Discussion of abnormal results/incidental findings:             Venancio Poisson, MD  03/27/14 [phone]

## 2014-03-26 DIAGNOSIS — J189 Pneumonia, unspecified organism: Secondary | ICD-10-CM | POA: Diagnosis present

## 2014-03-26 LAB — COMPREHENSIVE METABOLIC PANEL
ALT: <6 U/L (ref 0–55)
AST (SGOT): 12 U/L (ref 5–34)
Albumin/Globulin Ratio: 1.5 (ref 0.9–2.2)
Albumin: 2.7 g/dL — ABNORMAL LOW (ref 3.5–5.0)
Alkaline Phosphatase: 65 U/L (ref 37–106)
Anion Gap: 6 (ref 5.0–15.0)
BUN: 7 mg/dL (ref 7–19)
Bilirubin, Total: 0.6 mg/dL (ref 0.2–1.2)
CO2: 23 mEq/L (ref 22–29)
Calcium: 8.2 mg/dL (ref 7.9–10.2)
Chloride: 106 mEq/L (ref 100–111)
Creatinine: 0.5 mg/dL — ABNORMAL LOW (ref 0.6–1.0)
Globulin: 1.8 g/dL — ABNORMAL LOW (ref 2.0–3.6)
Glucose: 130 mg/dL — ABNORMAL HIGH (ref 70–100)
Potassium: 4 mEq/L (ref 3.5–5.1)
Protein, Total: 4.5 g/dL — ABNORMAL LOW (ref 6.0–8.3)
Sodium: 135 mEq/L — ABNORMAL LOW (ref 136–145)

## 2014-03-26 LAB — RETICULOCYTES
Immature Platelet Fraction: 4.6 % (ref 0.9–11.2)
Immature Retic Fract: 0 % — ABNORMAL LOW (ref 3.0–15.9)
Reticulocyte Count Absolute: 0.004 10*6/uL — ABNORMAL LOW (ref 0.0210–0.1350)
Reticulocyte Count Automated: 0.1 % — ABNORMAL LOW (ref 0.5–2.5)
Reticulocyte Hemoglobin: 30.2 pg (ref 28.2–36.6)

## 2014-03-26 LAB — HEMOLYSIS INDEX: Hemolysis Index: 5 (ref 0–18)

## 2014-03-26 LAB — CBC
Hematocrit: 22.9 % — ABNORMAL LOW (ref 37.0–47.0)
Hgb: 7.7 g/dL — ABNORMAL LOW (ref 12.0–16.0)
MCH: 29.1 pg (ref 28.0–32.0)
MCHC: 33.6 g/dL (ref 32.0–36.0)
MCV: 86.4 fL (ref 80.0–100.0)
MPV: 10.6 fL (ref 9.4–12.3)
Nucleated RBC: 0 /100 WBC (ref 0–1)
Platelets: 94 10*3/uL — ABNORMAL LOW (ref 140–400)
RBC: 2.65 10*6/uL — ABNORMAL LOW (ref 4.20–5.40)
RDW: 15 % (ref 12–15)
WBC: 0.72 10*3/uL — ABNORMAL LOW (ref 3.50–10.80)

## 2014-03-26 LAB — IRON PROFILE
Iron Saturation: 31 % (ref 15–50)
Iron: 64 ug/dL (ref 40–145)
TIBC: 206 ug/dL — ABNORMAL LOW (ref 265–497)
UIBC: 142 ug/dL (ref 126–382)

## 2014-03-26 LAB — URINALYSIS, REFLEX TO MICROSCOPIC EXAM IF INDICATED
Bilirubin, UA: NEGATIVE
Blood, UA: NEGATIVE
Glucose, UA: NEGATIVE
Ketones UA: NEGATIVE
Leukocyte Esterase, UA: NEGATIVE
Nitrite, UA: NEGATIVE
Protein, UR: NEGATIVE
Specific Gravity UA: 1.009 (ref 1.001–1.035)
Urine pH: 7 (ref 5.0–8.0)
Urobilinogen, UA: NORMAL mg/dL

## 2014-03-26 LAB — ECG 12-LEAD
Atrial Rate: 83 {beats}/min
P Axis: 42 degrees
P-R Interval: 160 ms
Q-T Interval: 374 ms
QRS Duration: 78 ms
QTC Calculation (Bezet): 439 ms
R Axis: 58 degrees
T Axis: 63 degrees
Ventricular Rate: 83 {beats}/min

## 2014-03-26 LAB — FERRITIN: Ferritin: 1419.27 ng/mL — ABNORMAL HIGH (ref 4.60–204.00)

## 2014-03-26 LAB — GFR: EGFR: >60

## 2014-03-26 LAB — FOLATE: Folate: 4.2 ng/mL

## 2014-03-26 LAB — VITAMIN B12: Vitamin B-12: >2000 pg/mL — ABNORMAL HIGH (ref 211–911)

## 2014-03-26 MED ORDER — SODIUM CHLORIDE 0.9 % IV SOLN
3.3750 g | Freq: Three times a day (TID) | INTRAVENOUS | Status: DC
Start: 2014-03-26 — End: 2014-03-26
  Filled 2014-03-26: qty 15

## 2014-03-26 MED ORDER — POLYETHYLENE GLYCOL 3350 17 G PO PACK
17.0000 g | PACK | Freq: Every day | ORAL | Status: DC | PRN
Start: 2014-03-26 — End: 2014-03-28

## 2014-03-26 MED ORDER — PIPERACILLIN-TAZOBACTAM 4.5 GM IN NS 100 ML IVPB (CNR)
4.5000 g | Freq: Four times a day (QID) | INTRAVENOUS | Status: DC
Start: 2014-03-26 — End: 2014-03-26
  Filled 2014-03-26 (×5): qty 100

## 2014-03-26 MED ORDER — BENZONATATE 100 MG PO CAPS
100.0000 mg | ORAL_CAPSULE | Freq: Three times a day (TID) | ORAL | Status: DC | PRN
Start: 2014-03-26 — End: 2014-03-28

## 2014-03-26 MED ORDER — ACETAMINOPHEN 325 MG PO TABS
650.0000 mg | ORAL_TABLET | ORAL | Status: DC | PRN
Start: 2014-03-26 — End: 2014-03-28
  Administered 2014-03-27: 650 mg via ORAL
  Filled 2014-03-26: qty 1

## 2014-03-26 MED ORDER — DOCUSATE SODIUM 100 MG PO CAPS
100.0000 mg | ORAL_CAPSULE | Freq: Two times a day (BID) | ORAL | Status: DC
Start: 2014-03-26 — End: 2014-03-28
  Administered 2014-03-26 – 2014-03-28 (×5): 100 mg via ORAL
  Filled 2014-03-26 (×5): qty 1

## 2014-03-26 MED ORDER — DEXTROSE-SODIUM CHLORIDE 5-0.45 % IV SOLN
INTRAVENOUS | Status: DC
Start: 2014-03-26 — End: 2014-03-26

## 2014-03-26 MED ORDER — MAGNESIUM HYDROXIDE 400 MG/5ML PO SUSP
30.0000 mL | Freq: Every day | ORAL | Status: DC | PRN
Start: 2014-03-26 — End: 2014-03-28

## 2014-03-26 MED ORDER — KCL IN DEXTROSE-NACL 20-5-0.45 MEQ/L-%-% IV SOLN
INTRAVENOUS | Status: DC
Start: 2014-03-26 — End: 2014-03-26

## 2014-03-26 MED ORDER — VANCOMYCIN HCL 1000 MG IV SOLR
750.0000 mg | INTRAVENOUS | Status: DC
Start: 2014-03-26 — End: 2014-03-28
  Administered 2014-03-26 – 2014-03-27 (×2): 750 mg via INTRAVENOUS
  Filled 2014-03-26 (×4): qty 750

## 2014-03-26 MED ORDER — SODIUM CHLORIDE 0.9 % IV MBP
2.2500 g | Freq: Four times a day (QID) | INTRAVENOUS | Status: DC
Start: 2014-03-26 — End: 2014-03-26
  Filled 2014-03-26 (×5): qty 2.25

## 2014-03-26 MED ORDER — PIPERACILLIN-TAZOBACTAM 4.5 GM IN NS 100 ML IVPB (CNR)
4.5000 g | Freq: Three times a day (TID) | INTRAVENOUS | Status: DC
Start: 2014-03-26 — End: 2014-03-28
  Administered 2014-03-26 – 2014-03-28 (×6): 4.5 g via INTRAVENOUS
  Filled 2014-03-26 (×10): qty 100

## 2014-03-26 MED ORDER — VANCOMYCIN HCL 1000 MG IV SOLR
1000.0000 mg | INTRAVENOUS | Status: DC
Start: 2014-03-26 — End: 2014-03-26
  Filled 2014-03-26: qty 1000

## 2014-03-26 MED ORDER — HYDROMORPHONE HCL PF 1 MG/ML IJ SOLN
0.5000 mg | INTRAMUSCULAR | Status: DC | PRN
Start: 2014-03-26 — End: 2014-03-28

## 2014-03-26 MED ORDER — KCL IN DEXTROSE-NACL 20-5-0.9 MEQ/L-%-% IV SOLN
INTRAVENOUS | Status: DC
Start: 2014-03-26 — End: 2014-03-28

## 2014-03-26 MED ORDER — METOPROLOL SUCCINATE ER 25 MG PO TB24
25.0000 mg | ORAL_TABLET | Freq: Every day | ORAL | Status: DC
Start: 2014-03-26 — End: 2014-03-28
  Administered 2014-03-26 – 2014-03-27 (×2): 25 mg via ORAL
  Filled 2014-03-26 (×3): qty 1

## 2014-03-26 MED ORDER — ONDANSETRON HCL 4 MG/2ML IJ SOLN
4.0000 mg | Freq: Every day | INTRAMUSCULAR | Status: DC | PRN
Start: 2014-03-26 — End: 2014-03-28

## 2014-03-26 MED ORDER — VANCOMYCIN HCL 1000 MG IV SOLR
1000.0000 mg | INTRAVENOUS | Status: DC
Start: 2014-03-26 — End: 2014-03-26
  Filled 2014-03-26 (×2): qty 1000

## 2014-03-26 MED ORDER — BENZONATATE 100 MG PO CAPS
100.0000 mg | ORAL_CAPSULE | Freq: Three times a day (TID) | ORAL | Status: DC | PRN
Start: 2014-03-26 — End: 2014-03-26

## 2014-03-26 MED ORDER — ZOLPIDEM TARTRATE 5 MG PO TABS
5.0000 mg | ORAL_TABLET | Freq: Every evening | ORAL | Status: DC | PRN
Start: 2014-03-26 — End: 2014-03-28

## 2014-03-26 MED ORDER — GUAIFENESIN 100 MG/5ML PO SOLN
200.0000 mg | ORAL | Status: DC | PRN
Start: 2014-03-26 — End: 2014-03-28

## 2014-03-26 NOTE — Plan of Care (Signed)
Problem: Potential for Compromised Skin Integrity  Goal: Skin integrity is maintained or improved  Assess and monitor skin integrity. Identify patients at risk for skin breakdown on admission and per policy. Collaborate with interdisciplinary team and initiate plans and interventions as needed.   Outcome: Progressing  PT was noted to have reddish to purle patch on her bilateral inner buttocks.  Pt is aware that she has blister in the past.  Skin on her sacrum is intact and Mepilex sacrum was applied.  HydraGuard applied on the red patch.  Will continue to monitor.

## 2014-03-26 NOTE — Progress Notes (Signed)
Maureen Vasquez is a 80 y.o. female patient.  Active Problems:    Pneumonia    Past Medical History   Diagnosis Date   . H/O acute pancreatitis 1990's   . Back pain    . Seasonal allergic rhinitis    . NHL (non-Hodgkin's lymphoma) October 2013     w/ Splenic Nodules   . Lymphona, mantle cell, inguinal region/lower limb      Current Facility-Administered Medications   Medication Dose Route Frequency Provider Last Rate Last Dose   . acetaminophen (TYLENOL) tablet 650 mg  650 mg Oral Q4H PRN Darryll Capers R, MD       . benzonatate (TESSALON) capsule 100 mg  100 mg Oral TID PRN Allena Katz, Kalab Camps R, MD       . dextrose 5 % and 0.9 % NaCl with KCl 20 mEq infusion   Intravenous Continuous Lenox Ponds, MD 125 mL/hr at 03/26/14 1018     . docusate sodium (COLACE) capsule 100 mg  100 mg Oral BID Darryll Capers R, MD   100 mg at 03/26/14 1018   . guaiFENesin (ROBITUSSIN) oral solution 200 mg  200 mg Oral Q4H PRN Allena Katz, Verdell Dykman R, MD       . HYDROmorphone (DILAUDID) injection 0.5 mg  0.5 mg Intravenous Q2H PRN Allena Katz, Aamori Mcmasters R, MD       . magnesium hydroxide (MILK OF MAGNESIA) 400 MG/5ML suspension 30 mL  30 mL Oral QD PRN Darryll Capers R, MD       . metoprolol XL (TOPROL-XL) 24 hr tablet 25 mg  25 mg Oral Daily Allena Katz, Telly Broberg R, MD   25 mg at 03/26/14 1018   . ondansetron (ZOFRAN) injection 4 mg  4 mg Intravenous QD PRN Clementine Soulliere R, MD       . piperacillin-tazobactam (ZOSYN) 2.25 g in sodium chloride 0.9 % 50 mL IVPB mini-bag plus  2.25 g Intravenous Q6H Ramilo, Paul B, MD       . polyethylene glycol (MIRALAX) packet 17 g  17 g Oral QD PRN Allena Katz, Jafar Poffenberger R, MD       . vancomycin (VANCOCIN) 750 mg in sodium chloride 0.9 % 150 mL IVPB  750 mg Intravenous Q24H Ramilo, Paul B, MD       . zolpidem (AMBIEN) tablet 5 mg  5 mg Oral QHS PRN Lenox Ponds, MD         Allergies   Allergen Reactions   . Aspirin Other (See Comments)     Oral and stomach irritation if taken daily   . Codeine Nausea Only   . Salicylates      Blood pressure  116/56, pulse 83, temperature 96.6 F (35.9 C), temperature source Oral, resp. rate 18, height 1.626 m (5\' 4" ), weight 50.939 kg (112 lb 4.8 oz), SpO2 95 %.    Subjective:  Symptoms:  She reports shortness of breath, malaise, cough and weakness.  No chest pain or diarrhea.    Diet:  Poor intake.  No nausea or vomiting.    Activity level: Impaired due to weakness.    Pain:  She complains of pain that is mild.  She reports pain is improving.  Pain is well controlled.    Review of Systems   Constitutional: Positive for fever, weight loss and malaise/fatigue. Negative for chills.   HENT: Negative for congestion, ear discharge and tinnitus.    Eyes: Negative for blurred vision, double vision, photophobia and pain.   Respiratory: Positive for cough,  sputum production and shortness of breath. Negative for wheezing.    Cardiovascular: Negative for chest pain, palpitations, orthopnea and leg swelling.   Gastrointestinal: Negative for nausea, vomiting, diarrhea and constipation.   Genitourinary: Negative for urgency, frequency and hematuria.   Musculoskeletal: Negative for back pain and neck pain.   Skin: Negative for itching and rash.   Neurological: Positive for dizziness and weakness. Negative for tingling, seizures and loss of consciousness.   Psychiatric/Behavioral: Negative for depression, suicidal ideas and memory loss. The patient does not have insomnia.          Objective:  General Appearance:  Comfortable, ill-appearing, in no acute distress and not in pain.    Vital signs: (most recent): Blood pressure 116/56, pulse 83, temperature 96.6 F (35.9 C), temperature source Oral, resp. rate 18, height 1.626 m (5\' 4" ), weight 50.939 kg (112 lb 4.8 oz), SpO2 95 %.  Vital signs are normal.  Fever.    Output: Producing urine and producing stool.    HEENT: Normal HEENT exam.    Lungs:  Increased effort.  Breath sounds clear to auscultation.  There are decreased breath sounds.  No wheezes or rhonchi.    Heart: Normal rate.   S1 normal.  No murmur, gallop or friction rub.   Extremities: Normal range of motion.  There is no deformity, effusion, local swelling or dependent edema.    Neurological: Patient is alert.  Patient has normal reflexes, normal muscle tone and normal coordination.    Skin:  Warm, dry and pale.  No rash, ecchymosis or cyanosis.   Abdomen: Abdomen is soft.  Bowel sounds are normal.   There is no abdominal tenderness tenderness.   There is no mass. There is splenomegaly. There is no hepatomegaly.   Pupils:  Pupils are equal, round, and reactive to light.    Pulses: Distal pulses are intact.    MDM  Number of Diagnoses or Management Options  Pneumonia: established, worsening     Amount and/or Complexity of Data Reviewed  Clinical lab tests: reviewed  Tests in the radiology section of CPT: reviewed  Tests in the medicine section of CPT: reviewed  Discussion of test results with the performing providers: yes  Decide to obtain previous medical records or to obtain history from someone other than the patient: yes  Obtain history from someone other than the patient: yes  Review and summarize past medical records: yes  Discuss the patient with other providers: yes  Independent visualization of images, tracings, or specimens: yes    Risk of Complications, Morbidity, and/or Mortality  Presenting problems: high  Diagnostic procedures: high  Management options: high    Critical Care  Total time providing critical care: 30-74 minutes    Patient Progress  Patient progress: stable        Assessment:  (S/P R -CHOP for NHL  Pancytopenia related to chemo s/p neulesta last week after chemo  Left pneumonia  ppresyncope  dehydration  ).     Plan:   Consults: pulmonology (ID,hem).  Regular diet.  Give fluids.   (IVF  Zosyn/vanc  Nutrition  Watch counts  D/W husband  Called consults  ).       Travoris Bushey R Allena Katz  03/26/2014

## 2014-03-26 NOTE — Plan of Care (Signed)
Problem: Safety  Goal: Patient will be free from injury during hospitalization  Outcome: Progressing  Reviewed safety precautions with pt, call light, hourly rounding, bed alarm, bathroom buddy, belongings within reach. Demonstrated and verbalized use of call light. Will cont to monitor.           Problem: Tissue integrity  Goal: Damaged tissue is healing and protected  Outcome: Progressing  Reviewed with pt about turning from side to side. Wound care seen pt about buttocks crack redness. Recommended pt not to wear depends, but pt refused removal. Educated on making sure they are dry to prevent rash and breakdown.  Pt verbalized understanding. Will cont to monitor.     Pt awake and alert, oriented x4. Pt in no apparent distress. Pt denies pain. Mediport CDI and infusing. Labs drawn. Pt reported 2 small BM and feels better, cont administration of stool softener to prevent constipation. Husband at bedside during lunch.  Pt bed lowest and locked fall alarm on, call light within reach. All needs met. Will continue to monitor.

## 2014-03-26 NOTE — H&P (Addendum)
[facility]      Patient: Maureen Vasquez  Date: 03/25/2014   DOB: 27-Nov-1933  Admission Date: 03/25/2014   MRN: [id number]  Attending:Mariselda Badalamenti Delia Chimes MD       Chief Complaint   Patient presents with   . Fever   . Near syncope    Rt pneumonia    History Gathered From: Self    HISTORY AND PHYSICAL     Maureen Vasquez is a 80 y.o. female with a PMHx of non-Hodgkin's lymphoma, mild hypertension who presented with   Low-grade fevers near syncopal episode.  She had felt dizzy, however, she had not had a bowel movement the until today when she took some Colace.  She felt weak as well and had not taken enough fluids.  She had some abdominal discomfort due to constipation and now better.  She denies any chest pain but because she had fever and felt dizzy.  She had just gotten out as a shower in the just lowered herself down to the floor when the husband said we shall go to the emergency room    Past Medical History   Diagnosis Date   . H/O acute pancreatitis 1990's   . Back pain    . Seasonal allergic rhinitis    . NHL (non-Hodgkin's lymphoma) October 2013     w/ Splenic Nodules   . Lymphona, mantle cell, inguinal region/lower limb        Past Surgical History   Procedure Laterality Date   . Appendectomy       Age of 33   . Excision, squamous cell       Right hand/ 2000   . Placement, mediport         Prior to Admission medications    Medication Sig Start Date End Date Taking? Authorizing Provider   docusate sodium (COLACE) 100 MG capsule Take 100 mg by mouth 2 (two) times daily.   Yes [provider]   metoprolol XL (TOPROL-XL) 25 MG 24 hr tablet Take 1 tablet (25 mg total) by mouth daily. 03/05/14 04/04/14 Yes Jana Half, MD   benzonatate (TESSALON) 100 MG capsule Take 1 capsule (100 mg total) by mouth 3 (three) times daily as needed for Cough. 03/05/14 04/04/14  Jana Half, MD       Allergies   Allergen Reactions   . Aspirin Other (See Comments)     Oral and stomach  irritation if taken daily   . Codeine Nausea Only   . Salicylates        CODE STATUS: full code    PRIMARY CARE MD: Mack Hook, MD    Family History   Problem Relation Age of Onset   . Heart attack Mother    . Heart disease Mother    . Parkinsonism Mother    . Hyperlipidemia Mother    . Heart disease Sister    . Heart disease Brother        History   Substance Use Topics   . Smoking status: Never Smoker    . Smokeless tobacco: Not on file   . Alcohol Use: No       REVIEW OF SYSTEMS     Constitutional:  Low-grade fever .  No chills   Eyes:  Denies change in visual acuity   HENT:  Denies nasal congestion or sore throat   Respiratory:  Has cough and shortness of breath   Cardiovascular:  Denies chest pain  or edema   GI: Mild abdominal pain, no nausea, vomiting, bloody stools or diarrhea   GU:  Denies dysuria   Musculoskeletal:  Denies back pain or joint pain   Integument:  Denies rash   Neurologic:  Denies headache, focal weakness or sensory changes   Endocrine:  Denies polyuria or polydipsia   Lymphatic:  Denies swollen glands   Psychiatric:  Denies depression or anxiety     PHYSICAL EXAM     Vital Signs (most recent): BP 130/60   Pulse 86   Temp(Src) 97.9 F (36.6 C)   Resp 18   Ht 1.626 m (5\' 4" )   Wt 49.896 kg (110 lb)   BMI 18.87 kg/m2     SpO2 96%   Constitutional:  Patient speaks freely in full sentences.   HEENT: NC/AT, PERRL, no scleral icterus .  Positive conjunctival pallor, no nasal discharge, MMM, oropharynx without erythema or exudate  Neck: trachea midline, supple, no cervical or supraclavicular lymphadenopathy or masses  Cardiovascular: RRR, normal S1 S2, no murmurs, gallops, palpable thrills, no JVD, Non-displaced PMI.  Respiratory: Normal rate. No retractions or increased work of breathing. Clear to auscultation and percussion bilaterally.  Gastrointestinal: +BS, non-distended, soft, non-tender, no rebound or guarding, no hepatosplenomegaly  Genitourinary: no suprapubic or costovertebral  angle tenderness  Musculoskeletal: ROM and motor strength grossly normal. No clubbing, edema, or cyanosis. DP and radial pulses 2+ and symmetric.  Skin: no rashes, jaundice or other lesions but fairly dehydrated  Neurologic: EOMI, CN 2-12 grossly intact. no gross motor or sensory deficits, patellar and bicep DTR 2+ and symmetric, downward plantar reflexes  Psychiatric: AAOx3, affect and mood appropriate. The patient is alert, interactive, appropriate.    LABS & IMAGING     Recent Results (from the past 24 hour(s))   BASIC METABOLIC PANEL    Collection Time     03/25/14  9:04 PM       Result Value Ref Range    Glucose 128 (*) 70 - 100 mg/dL    BUN 11  7 - 19 mg/dL    Creatinine 0.7  0.6 - 1.0 mg/dL    CALCIUM 8.9  7.9 - 76.2 mg/dL    Sodium 831 (*) 517 - 145 mEq/L    Potassium 3.8  3.5 - 5.1 mEq/L    Chloride 95 (*) 100 - 111 mEq/L    CO2 21 (*) 22 - 29 mEq/L    Anion Gap 12.0  5.0 - 15.0   CBC AND DIFFERENTIAL    Collection Time     03/25/14  9:04 PM       Result Value Ref Range    WBC 1.06 (*) 3.50 - 10.80 x10 3/uL    RBC 3.11 (*) 4.20 - 5.40 x10 6/uL    Hgb 9.2 (*) 12.0 - 16.0 g/dL    Hematocrit 61.6 (*) 37.0 - 47.0 %    MCV 87.8  80.0 - 100.0 fL    MCH 29.6  28.0 - 32.0 pg    MCHC 33.7  32.0 - 36.0 g/dL    RDW 15  12 - 15 %    Platelets 126 (*) 140 - 400 x10 3/uL    MPV 10.6  9.4 - 12.3 fL   GFR    Collection Time     03/25/14  9:04 PM       Result Value Ref Range    EGFR >60.0     COMPREHENSIVE METABOLIC PANEL ADD ON  Collection Time     03/25/14  9:04 PM       Result Value Ref Range    Protein, Total 5.6 (*) 6.0 - 8.3 g/dL    Albumin 3.4 (*) 3.5 - 5.0 g/dL    AST (SGOT) 19  5 - 34 U/L    ALT 8  0 - 55 U/L    Alkaline Phosphatase 82  37 - 106 U/L    Bilirubin, Total 0.9  0.2 - 1.2 mg/dL    Globulin 2.2  2.0 - 3.6 g/dL    Albumin/Globulin Ratio 1.5  0.9 - 2.2   CK    Collection Time     03/25/14  9:04 PM       Result Value Ref Range    Creatine Kinase (CK) 17 (*) 29 - 168 U/L   LIPASE    Collection Time      03/25/14  9:04 PM       Result Value Ref Range    Lipase 10  8 - 78 U/L   TROPONIN I    Collection Time     03/25/14  9:04 PM       Result Value Ref Range    Troponin I 0.11 (*) 0.00 - 0.09 ng/mL   MANUAL DIFFERENTIAL    Collection Time     03/25/14  9:04 PM       Result Value Ref Range    Segmented Neutrophils 56  None %    Band Neutrophils 22  None %    Lymphocytes Manual 15  None %    Monocytes Manual 4  None %    Eosinophils Manual 3  None %    Basophils Manual 0  None %    Nucleated RBC 0  0 - 1 /100 WBC    Abs Seg Manual 0.59 (*) 1.80 - 8.10 x10 3/uL    Bands Absolute 0.23  0.00 - 1.00 x10 3/uL    Absolute Lymph Manual 0.16 (*) 0.50 - 4.40 x10 3/uL    Monocytes Absolute 0.04  0.00 - 1.20 x10 3/uL    Absolute Eos Manual 0.03  0.00 - 0.70 x10 3/uL    Absolute Baso Manual 0.00  0.00 - 0.20 x10 3/uL   CELL MORPHOLOGY    Collection Time     03/25/14  9:04 PM       Result Value Ref Range    Cell Morphology: Normal      Hypersegmentation =1+ (*)     Toxic Granulation =1+ (*)     Platelet Estimate Decreased (*)     Giant Platelets Present (*)        MICROBIOLOGY:  Blood Culture: done  Urine Culture: done  Antibiotics Started: Zosyn and Levaquin    IMAGING:  Chest 2 Views   Final Result   Clinical history. Fever.      Portable chest demonstrates some basilar atelectasis on the left base   and in the left perihilar region. Central venous catheter is in good   position. The right lung is clear. The heart is not remarkable.      IMPRESSION:     Linear perihilar left lung base densities early infiltrate   cannot be excluded.      Bernarda Caffey, MD    03/25/2014 10:09 PM             CARDIAC:  EKG Interpretation:  sinus rhythm        EMERGENCY DEPARTMENT COURSE:  Orders Placed This Encounter   Procedures   . Blood Culture Aerobic/Anaerobic #1   . Blood Culture Aerobic/Anaerobic #2   . Urine culture   . Chest 2 Views   . Basic Metabolic Panel   . CBC and differential   . GFR   . UA, Reflex to Microscopic   . GFR   . CMP Add On    . Creatine Kinase (CK)   . Lipase   . Troponin I   . Manual Differential   . Cell MorpHology   . Comprehensive metabolic panel   . CBC without differential   . Diet regular   . Vital signs   . Pain Assessment   . Vital signs   . Bed rest with bathroom privileges   . Notify physician   . I/O   . Height   . Weight   . Skin assessment   . Nasal Cannula Low-Flow (5 LPM or less)   . Intermittent Pneumatic Compression   . Education: Activity   . Education: Disease Process & Condition   . Education: Pain Management   . Education: Falls Risk   . Nasal Cannula Low-Flow (5 lpm or Less)   . Full Code   . [facility] [employer]   . ED [employer]   . ECG 12 lead   . Admit to Inpatient   . [facility] ED Bed Request       ASSESSMENT & PLAN     DEMITRIA HAY is a 80 y.o. female admitted under INPATIENT with left pneumonia.  Dehydration.  Leukopenia.pancytopenia associated with chemotherapy  Status post R CHOP for NHL.  Hypertension.  Hyponatremia   Plan   IV fluids.   Zosyn, Levaquin.   Monitor WBC.  Hemoglobin   Hematology consult    2. GI Prophylaxis  add a PPI    3. Nutrition  Regular    4. DVT/VTE Prophylaxis  SCD's    Anticipated medical stability for discharge: May, 15 - Evening    Service status/Reason for ongoing hospitalizatio: Inpatient: risk of progressive disease  Anticipated Discharge Needs: None    Signed,  Lenox Ponds MD  03/26/2014 12:45 AM

## 2014-03-26 NOTE — Progress Notes (Signed)
Nutritional Support Services  Nutrition Assessment    Maureen Vasquez 80 y.o. female   MRN: [id number]      Reason for Assessment: Referral from Nutrition Screening on admission - unplanned wt loss >= 10 lb/one month      Nutrition Summary: 80 yo F adm with fever, pneumonia, low WBC count. Undergoing chemotx for non-hodgkins lymphoma. Pt reports wt loss from 127 down to a low of 110 lb. Notes decreased appetite, taste changes, constipation and resultant nausea. Has tried Ensure supplements and not tolerated these well. Admits that she does not drink enough fluids on many days.  Current intake is poor-fair. Ate soup at lunch and is agreeable to a milkshake at this time.    Nutrition Diagnosis:   Severe Protein Energy Malnutrition related to chronic illness as evidenced by % intake < 75% of needs for > 1 month, wt loss > 10 % in 6 months.    Intervention:  1. Milkshakes added daily for additional kcal, protein intake.  2. Food Services alerted to asisst pt with meal selections.  3.  Briefly discussed and provided written guidelines for neutropenic diet precautions.     Goal: Avoid further wt loss.           PO intake to meet >= 75% of estimated needs.      Assessment Data:  Adm dx:  Fever, pneumonia   Patient Active Problem List   Diagnosis   . Low back ache   . Neutropenia   . Syncope and collapse   . NHL (non-Hodgkin's lymphoma)   . Generalized weakness   . Pneumonia       PMH:  has a past medical history of H/O acute pancreatitis (1990's); Back pain; Seasonal allergic rhinitis; NHL (non-Hodgkin's lymphoma) (October 2013); and Lymphona, mantle cell, inguinal region/lower limb.    Pertinent labs:    Recent Labs  Lab 03/26/14  0628 03/25/14  2104   Sodium 135* 128*   Potassium 4.0 3.8   Chloride 106 95*   CO2 23 21*   BUN 7 11   Creatinine 0.5* 0.7   Glucose 130* 128*   CALCIUM 8.2 8.9       Pertinent meds:  Current Facility-Administered Medications   Medication Dose Route Frequency   . docusate sodium  100 mg Oral BID    . metoprolol XL  25 mg Oral Daily   . piperacillin-tazobactam  4.5 g Intravenous Q8H   . vancomycin  750 mg Intravenous Q24H     . dextrose 5 % and 0.9 % NaCl with KCl 20 mEq 125 mL/hr at 03/26/14 1018       Social History:  Married, supportive spouse    Diet History:  Notes decreased appetite, taste changes, constipation and resultant nausea. Has tried Ensure supplements and not tolerated these well. Admits that she does not drink enough fluids on many days.    Wt Readings from Last 30 Encounters:   03/26/14 50.939 kg (112 lb 4.8 oz)   02/27/14 53.071 kg (117 lb)   12/23/13 58.514 kg (129 lb)   08/19/13 57.607 kg (127 lb)   01/18/13 57.153 kg (126 lb)   10/24/12 57.153 kg (126 lb)   10/24/12 57.153 kg (126 lb)   06/01/12 56.7 kg (125 lb)   11/16/11 57.425 kg (126 lb 9.6 oz)         Current Diet Order  Diet regular    Food intake: poor - fair    GI symptoms: constipation  resolved at present    Skin: intact    Learning Needs: symptom management    Anthropometrics  Height: 162.6 cm (5\' 4" )  Weight: 50.939 kg (112 lb 4.8 oz)  Weight Change: 2.09  IBW/kg (Calculated) Female: 54.54 kg  BMI (calculated): 19.3      Estimated Energy Needs  Total Energy Estimated Needs: 1275 - 1780 kcal  Method for Estimating Needs: 25 - 35 kcal/kg       Estimated Protein Needs  Total Protein Estimated Needs: 51 - 61 gm pro  Method for Estimating Needs: 1.0 - 1.2 gm/kg            Fluid Needs  Total Fluid Estimated Needs: 1530 - 2035 mL  Method for Estimating Needs: 30 - 40 mL/kg         Monitoring/Evaluation:   1. PO intake  2. Weights  3.  GI symptoms        Fransisca Kaufmann, RD  Ext. [phone]

## 2014-03-26 NOTE — Consults (Signed)
Service Date: 03/26/2014     Patient Type: I     CONSULTING PHYSICIAN: Marisa Sprinkles MD     REFERRING PHYSICIAN: Lenox Ponds MD     PRIMARY CARE PHYSICIAN:    Ree Edman, MD     ONCOLOGIST:  Chana Bode, MD     INFECTIOUS DISEASE:    Sissy Hoff, MD     HISTORY OF PRESENT ILLNESS:  This is a 80 year old white female with a 6-day history of some fever and  near syncope for which she came to the emergency room.  A chest x-ray  showed a new left perihilar infiltrate.  The patient had just completed her  fifth cycle of CHOP.  She had a similar episode in April for which she was  hospitalized after completing her fourth cycle of CHOP.  Presently, the  patient was started on Zosyn and Levaquin with improvement in her general  clinical symptomatology.     ALLERGIES:  ASPIRIN and CODEINE.     MEDICATIONS:  Have included 5 out of 6 courses of CHOP since early January 2015.  Chest  x-ray showing left perihilar infiltrates.  White count is 0.7, hematocrit  is 22.9.  Cultures are pending.     PAST MEDICAL HISTORY:  Diagnosed in October 2013 was mantle cell non-Hodgkin lymphoma, history of  pancreatitis, history of rhinitis, chemotherapy was started in January of  2015 for the first time.     FAMILY HISTORY:  Mother died at 64 of heart disease.  Father died at 32 of pulmonary  embolism from a hernia surgery.     SOCIAL HISTORY:  Lifetime nonsmoker, married, moderate drinker.  High school education.   Homemaker.     REVIEW OF SYSTEMS:  CONSTITUTIONAL:  Fever and fatigue.  HEENT:  Wears glasses.  RESPIRATORY:  Some cough.  CARDIAC:  No angina, orthopnea, or PND.  GASTROINTESTINAL:  No nausea, vomiting, diarrhea.  GENITOURINARY:  No dysuria, frequency, urgency.    HEMATOLOGY/ONCOLOGY:  Mantle cell lymphoma under treatment with anemia.  PSYCHIATRIC:  No depression.  ENDOCRINE:  No diabetes or thyroid.     IMPRESSION:  1.  Left midlung pneumonia.  2.  History of mantle cell non-Hodgkin lymphoma.  3.  History of rhinitis.  4.   History of pancreatitis.  5.  Allergies to ASPIRIN and CODEINE.  6.  Pancytopenia from CHOP 5/6 cycles completed 03-19-2014     PLAN:  Continue Zosyn and Levaquin, await cultures.  If clinically improving,  continue therapy.  If the patient worsens a bronchoscopy and lavage can be  performed.  The risks and indications have been explained to the patient.           D:  03/26/2014 12:25 PM by Dr. Jonny Ruiz B. Tamera Punt, MD [phone])  T:  03/26/2014 12:58 PM by [id number]      Everlean Cherry: [id number]) (Doc ID: [id number])

## 2014-03-26 NOTE — Malnutrition Assessment (Signed)
Maureen Vasquez is a 80 y.o. female patient.   [id number]      Malnutrition Documentation    Severe Protein Energy Malnutrition related to chronic illness as evidenced by % intake < 75% of needs for > 1 month, wt loss > 10 % in 6 months.      Abner Greenspan, RD [phone]      If physician disagrees with this assessment see addendum.

## 2014-03-26 NOTE — Consults (Signed)
Consult received to evaluate  Skin integrity on buttocks on this 80 year old female admitted with pneumonia.  PMH includes non-Hodgkins lymphoma, HTN    Assessment: Pt in bed, alert and conversant. States she feels weak but better than yesterday. States the 5th day after chemo "really gets me". Pt states she wears Depends "in case of accident". Currently has depends on and does not want to remove. Pt has 3 small red areas L buttock and 1 small area R buttock  that blanche and do not appear related to pressure. Pt states the areas are not painful. Pt is independent with bed mobility      Recommendations: Encourage pt to remove Depends. Pt knowledgeable re importance of turning and repositioning self side to side. Use Hydraguard prn to red areas. Discussed care with direct care RN Alli.

## 2014-03-26 NOTE — Consults (Signed)
Service Date: 03/26/2014     Patient Type: I     CONSULTING PHYSICIAN: Threasa Beards MD     REFERRING PHYSICIAN: Lenox Ponds MD     CONSULTING SERVICE:   Infectious disease.     REASON FOR CONSULTATION:  Antibiotic recommendations.  The patient comes in with a developing fever,  neutropenia, possible developing pneumonia after having another round of  chemotherapy just last week.  She is well known by the infectious diseases  group secondary to previous admission.     HISTORY OF PRESENT ILLNESS:  Mrs. Maureen Vasquez is a very pleasant 80 year old female once again well known by  the infectious diseases group secondary to previous admission.  I actually  saw her on an initial consultation last month on April 20.  She has an  extensive history including non-Hodgkin's lymphoma status post  chemotherapy.  She was on chemotherapy the last time after which she  developed fevers and neutropenia and pneumonia to the point where she was  eventually sent home on oral antibiotics.  She just underwent another round  of chemotherapy last Wednesday.  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  Again, she  presents once again for fevers, some generalized weakness, dehydration  after completing another round of chemotherapy last Wednesday.  X-ray shows  that she is now also starting to develop left lower lobe pneumonia, early  stage.  She was appropriately admitted and started on antibiotics  empirically.  She was given a dose of Zosyn and Levaquin in the ER.  She is  now in the PCU telemetry area of [facility]  She is resting  comfortably in no apparent distress, appears nontoxic. Temperature-wise,  she is afebrile, but she is now neutropenic once again.     PAST MEDICAL HISTORY:  Again is listed above.     SOCIAL HISTORY:  She is a nonsmoker, nondrinker, non-IV drug abuser.  No recent  travel  outside of [place].     FAMILY HISTORY:  Noncontributory towards patient's current condition.     CURRENT MEDICATIONS:  As per [facility] including 1 dose of Zosyn and Levaquin in the ER.     ALLERGIES:  She has no known antibiotic allergies.     REVIEW OF SYSTEMS:  Again, significant for dehydration, generalized weakness, fatigue, fever,  and mild shortness of breath, all developing since her last round of  chemotherapy last Wednesday.     PHYSICAL EXAMINATION:  GENERAL:  She is hemodynamically stable and afebrile.  HEENT:  Unremarkable.  NECK:  Supple.  LUNGS:  Decreased breath sounds with crackles at the left base.  HEART:    ABDOMEN:  Soft, benign, positive bowel sounds.  EXTREMITIES:  1+ edema.  SKIN:  No rash, no pruritus.  NEUROLOGIC:  No focal deficits.  Mood, normal affect.     LABORATORY DATA:  Once again, she now is in absolute neutropenia with a white count 0.7, H  and H is 7.8/23, platelet count is also declining down to 94,000.  BUN and  creatinine of 7/0.5.  Remaining electrolytes appear to be within normal  limits.  LFTs appear to be  within normal limits,  lipase is  within normal limits at 10.  Urinalysis is negative for pyuria.  Chest  x-ray shows linear perihilar left lung base densities.  Early infiltrate  cannot be  excluded.  Additional cultures:  Again, blood x2 and urine have  been sent in the ER and are pending.     CONCLUSION:  This is a 80 year old female, chemotherapy, non-Hodgkin's lymphoma 1 week  after repeat round of chemotherapy, now with some fevers developing  neutropenia, early developing pneumonia on chest x-ray, generalized  weakness, dehydration, fatigue.  She has been appropriately admitted and  started on broad-spectrum antibiotics.  For now, we will continue her on a  combination of vancomycin 750 mg IV q.24 h. Check the trough level at the  third dose, and Zosyn 3.375 grams IV q.8 h.  This would be an appropriate  combination of antibiotics for the coverage of both  pneumonia as well as  for empiric coverage while the patient is in absolute neutropenia.  Will  follow up on the cultures and will closely follow the patient from this  point on.     Thanks again for this very interesting consultation.  Further  recommendations to follow.     Time of consultation took 60 minutes.           D:  03/26/2014 13:07 PM by Dr. Renae Fickle B. Shadee Rathod, MD ([id number])  T:  03/26/2014 13:58 PM by       Everlean Cherry: [id number]) (Doc ID: [id number])

## 2014-03-26 NOTE — Progress Notes (Signed)
HEMATOLOGY ONCOLOGY PROGRESS NOTE    Date Time: 03/26/2014 3:46 PM  Patient Name: Maureen Vasquez, Maureen Vasquez    Active Problems:    Pneumonia      HISTORY:    9 yt/o s/p cycle 5 of dose reduced R-CHOP for DLBCL with neulasta support, again admitted with febrile neutropenia, and with evidence of pneumonia. Currently afebrile, on levaquin and zosyn.        PHYSICAL EXAM:     Filed Vitals:    03/26/14 1207   BP: 116/56   Pulse: 83   Temp: 96.6 F (35.9 C)   Resp: 18   SpO2: 95%       Intake and Output Summary (Last 24 hours) at Date Time    Intake/Output Summary (Last 24 hours) at 03/26/14 1546  Last data filed at 03/26/14 1504   Gross per 24 hour   Intake    730 ml   Output   1600 ml   Net   -870 ml         Appearance: in no acute distress  Skin: no rash  HEENT: no icterus  Lungs: clear to percussion and ausculation  Heart: regular rate and rhythm  Abdomen: no tenderness, no organomegaly or mass  Extremities: no edema  Neuro: alert, strength grossly intact        ASSESSMENT/PLAN:       1) Febrile neutropenia with pneumonia: on antibiotics, follow cultures. Received neulasta in clinic  2) DLBCL: will f/u with Dr. Noralee Chars after discharge     Valda Lamb, MD  [facility]  [phone]    MEDS:   Scheduled Meds:  Current Facility-Administered Medications   Medication Dose Route Frequency   . docusate sodium  100 mg Oral BID   . metoprolol XL  25 mg Oral Daily   . piperacillin-tazobactam  4.5 g Intravenous Q8H   . vancomycin  750 mg Intravenous Q24H     Continuous Infusions:  . dextrose 5 % and 0.9 % NaCl with KCl 20 mEq 125 mL/hr at 03/26/14 1018     PRN Meds:.acetaminophen, benzonatate, guaiFENesin, HYDROmorphone, magnesium hydroxide, ondansetron, polyethylene glycol, zolpidem     LABS:     Results    Procedure Component Value Units Date/Time    IRON PROFILE [[id number]]  (Abnormal) Collected:  03/26/14 1145     Iron 64 ug/dL Updated:  82/95/62 1308     UIBC 142 ug/dL      TIBC 657 (L) ug/dL       Iron Saturation 31 %     Hemolysis index [[id number]] Collected:  03/26/14 1145     Hemolysis Index 5 Updated:  03/26/14 1546    Ferritin [[id number]] Collected:  03/26/14 1145    Specimen Information:  Blood Updated:  03/26/14 1532    Vitamin B12 [[id number]] Collected:  03/26/14 1145    Specimen Information:  Blood Updated:  03/26/14 1532    Folate [[id number]] Collected:  03/26/14 1145    Specimen Information:  Blood Updated:  03/26/14 1532    Reticulocytes [[id number]]  (Abnormal) Collected:  03/26/14 1144    Specimen Information:  Blood Updated:  03/26/14 1208     Reticulocyte Count Automated 0.1 (L) %      Retic Ct Abs 0.0040 (L) x10 6/uL      Immature Retic Fract 0.0 (L) %      Immature Plt Fraction 4.6 %      Retic Hgb 30.2 pg     CBC without differential [[id number]]  (  Abnormal) Collected:  03/26/14 0628    Specimen Information:  Blood / Blood Updated:  03/26/14 0836     WBC 0.72 (L) x10 3/uL      RBC 2.65 (L) x10 6/uL      Hgb 7.7 (L) g/dL      Hematocrit 98.1 (L) %      MCV 86.4 fL      MCH 29.1 pg      MCHC 33.6 g/dL      RDW 15 %      Platelets 94 (L) x10 3/uL      MPV 10.6 fL      Nucleated RBC 0 /100 WBC     Narrative:      Starting for 1 Occurrences    Comprehensive metabolic panel [[id number]]  (Abnormal) Collected:  03/26/14 0628    Specimen Information:  Blood Updated:  03/26/14 0721     Glucose 130 (H) mg/dL      BUN 7 mg/dL      Creatinine 0.5 (L) mg/dL      Sodium 621 (L) mEq/L      Potassium 4.0 mEq/L      Chloride 106 mEq/L      CO2 23 mEq/L      CALCIUM 8.2 mg/dL      Protein, Total 4.5 (L) g/dL      Albumin 2.7 (L) g/dL      AST (SGOT) 12 U/L      ALT <6 U/L      Alkaline Phosphatase 65 U/L      Bilirubin, Total 0.6 mg/dL      Globulin 1.8 (L) g/dL      Albumin/Globulin Ratio 1.5      Anion Gap 6.0     Narrative:      Starting for 1 Occurrences    GFR [[id number]] Collected:  03/26/14 0628     EGFR >60.0 Updated:  03/26/14 9629    Narrative:      Starting for 1 Occurrences    Urine culture  [[id number]] Collected:  03/26/14 0311    Specimen Information:  Urine / Urine, Clean Catch Updated:  03/26/14 0537    UA, Reflex to Microscopic [[id number]] Collected:  03/26/14 0311    Specimen Information:  Urine Updated:  03/26/14 0323     Urine Type Clean Catch      Color, UA Yellow      Clarity, UA Clear      Specific Gravity UA 1.009      Urine pH 7.0      Leukocyte Esterase, UA Negative      Nitrite, UA Negative      Protein, UR Negative      Glucose, UA Negative      Ketones UA Negative      Urobilinogen, UA Normal mg/dL      Bilirubin, UA Negative      Blood, UA Negative     Blood Culture Aerobic/Anaerobic #2 [[id number]] Collected:  03/25/14 2134    Specimen Information:  Blood Updated:  03/26/14 0036    Narrative:      1 BLUE+1 PURPLE    Blood Culture Aerobic/Anaerobic #1 [[id number]] Collected:  03/25/14 2109    Specimen Information:  Blood Updated:  03/26/14 0036    Narrative:      1 BLUE+1 PURPLE    CBC and differential [[id number]]  (Abnormal) Collected:  03/25/14 2104    Specimen Information:  Blood / Blood Updated:  03/26/14 0025  WBC 1.06 (L) x10 3/uL      RBC 3.11 (L) x10 6/uL      Hgb 9.2 (L) g/dL      Hematocrit 96.0 (L) %      MCV 87.8 fL      MCH 29.6 pg      MCHC 33.7 g/dL      RDW 15 %      Platelets 126 (L) x10 3/uL      MPV 10.6 fL     Troponin I [[id number]]  (Abnormal) Collected:  03/25/14 2104     Troponin I 0.11 (H) ng/mL Updated:  03/25/14 2146    Lipase [[id number]] Collected:  03/25/14 2104     Lipase 10 U/L Updated:  03/25/14 2140    CMP Add On [[id number]]  (Abnormal) Collected:  03/25/14 2104     Protein, Total 5.6 (L) g/dL Updated:  69/62/95 2841     Albumin 3.4 (L) g/dL      AST (SGOT) 19 U/L      ALT 8 U/L      Alkaline Phosphatase 82 U/L      Bilirubin, Total 0.9 mg/dL      Globulin 2.2 g/dL      Albumin/Globulin Ratio 1.5     Creatine Kinase (CK) [[id number]]  (Abnormal) Collected:  03/25/14 2104     Creatine Kinase (CK) 17 (L) U/L Updated:  03/25/14 2140    Cell MorpHology  [[id number]]  (Abnormal) Collected:  03/25/14 2104     Cell Morphology: Normal Updated:  03/25/14 2140     Hypersegmentation =1+ (A)      Toxic Granulation =1+ (A)      Platelet Estimate Decreased (A)      Giant Platelets Present (A)     Manual Differential [[id number]]  (Abnormal) Collected:  03/25/14 2104     Segmented Neutrophils 56 % Updated:  03/25/14 2140     Band Neutrophils 22 %      Lymphocytes Manual 15 %      Monocytes Manual 4 %      Eosinophils Manual 3 %      Basophils Manual 0 %      Nucleated RBC 0 /100 WBC      Abs Seg Manual 0.59 (L) x10 3/uL      Bands Absolute 0.23 x10 3/uL      Absolute Lymph Manual 0.16 (L) x10 3/uL      Monocytes Absolute 0.04 x10 3/uL      Absolute Eos Manual 0.03 x10 3/uL      Absolute Baso Manual 0.00 x10 3/uL     Basic Metabolic Panel [[id number]]  (Abnormal) Collected:  03/25/14 2104    Specimen Information:  Blood Updated:  03/25/14 2126     Glucose 128 (H) mg/dL      BUN 11 mg/dL      Creatinine 0.7 mg/dL      CALCIUM 8.9 mg/dL      Sodium 518 (L) mEq/L      Potassium 3.8 mEq/L      Chloride 95 (L) mEq/L      CO2 21 (L) mEq/L      Anion Gap 12.0     GFR [[id number]] Collected:  03/25/14 2104     EGFR >60.0 Updated:  03/25/14 2126          IMAGING DATA:  Radiology Results (24 Hour)    Procedure Component Value Units Date/Time    Chest 2 Views [[id number]] Collected:  03/25/14 2208  Order Status:  Completed  Updated:  03/25/14 2213    Narrative:      Clinical history. Fever.    Portable chest demonstrates some basilar atelectasis on the left base  and in the left perihilar region. Central venous catheter is in good  position. The right lung is clear. The heart is not remarkable.      Impression:       Linear perihilar left lung base densities early infiltrate  cannot be excluded.    Bernarda Caffey, MD   03/25/2014 10:09 PM

## 2014-03-27 ENCOUNTER — Inpatient Hospital Stay: Payer: Medicare Other

## 2014-03-27 LAB — CBC
Absolute NRBC: 0 10*3/uL
Hematocrit: 23.8 % — ABNORMAL LOW (ref 37.0–47.0)
Hgb: 8.1 g/dL — ABNORMAL LOW (ref 12.0–16.0)
MCH: 30 pg (ref 28.0–32.0)
MCHC: 34 g/dL (ref 32.0–36.0)
MCV: 88.1 fL (ref 80.0–100.0)
MPV: 11.1 fL (ref 9.4–12.3)
Nucleated RBC: 0 /100 WBC (ref 0–1)
Platelets: 79 10*3/uL — ABNORMAL LOW (ref 140–400)
RBC: 2.7 10*6/uL — ABNORMAL LOW (ref 4.20–5.40)
RDW: 15 % (ref 12–15)
WBC: 0.66 10*3/uL — ABNORMAL LOW (ref 3.50–10.80)

## 2014-03-27 LAB — COMPREHENSIVE METABOLIC PANEL
ALT: 12 U/L (ref 0–55)
AST (SGOT): 23 U/L (ref 5–34)
Albumin/Globulin Ratio: 1.5 (ref 0.9–2.2)
Albumin: 2.7 g/dL — ABNORMAL LOW (ref 3.5–5.0)
Alkaline Phosphatase: 72 U/L (ref 37–106)
Anion Gap: 7 (ref 5.0–15.0)
BUN: 2 mg/dL — ABNORMAL LOW (ref 7–19)
Bilirubin, Total: 0.5 mg/dL (ref 0.2–1.2)
CO2: 21 mEq/L — ABNORMAL LOW (ref 22–29)
Calcium: 8.3 mg/dL (ref 7.9–10.2)
Chloride: 105 mEq/L (ref 100–111)
Creatinine: 0.5 mg/dL — ABNORMAL LOW (ref 0.6–1.0)
Globulin: 1.8 g/dL — ABNORMAL LOW (ref 2.0–3.6)
Glucose: 87 mg/dL (ref 70–100)
Potassium: 3.9 mEq/L (ref 3.5–5.1)
Protein, Total: 4.5 g/dL — ABNORMAL LOW (ref 6.0–8.3)
Sodium: 133 mEq/L — ABNORMAL LOW (ref 136–145)

## 2014-03-27 LAB — GFR: EGFR: >60

## 2014-03-27 MED ORDER — ALBUTEROL SULFATE (2.5 MG/3ML) 0.083% IN NEBU
2.5000 mg | INHALATION_SOLUTION | Freq: Four times a day (QID) | RESPIRATORY_TRACT | Status: DC | PRN
Start: 2014-03-27 — End: 2014-03-28

## 2014-03-27 NOTE — Progress Notes (Signed)
PROGRESS NOTE    Date Time: 03/27/2014 6:27 AM  Patient Name: Maureen Vasquez, Maureen Vasquez      Assessment:   1. Non Hodgkins Lymphoma  2. Neutropenic Fever  3. LT. Lung pneumonia  4. Allergies  Asa/codeine  5. 5/6 cycles CHOP completed 03-19-2014    Plan:   1. Levaquin #3  2. Zosyn #3  3. Vanco #2  4. nebs  5. Chest xray  6. IV Fluids per attending    Subjective:   Feeling better since admission    Medications:     Current Facility-Administered Medications   Medication Dose Route Frequency   . docusate sodium  100 mg Oral BID   . metoprolol XL  25 mg Oral Daily   . piperacillin-tazobactam  4.5 g Intravenous Q8H   . vancomycin  750 mg Intravenous Q24H         Physical Exam:     Filed Vitals:    03/27/14 0419   BP: 131/61   Pulse: 71   Temp: 95.7 F (35.4 C)   Resp: 19   SpO2: 99%       General appearance - alert, chronically ill appearing, and in no distress, oriented to person, place, and time and acyanotic, in no respiratory distress  HEENT:  PERRLA  Mental status - alert, oriented to person, place, and time  Chest - diminished breath sounds, few rhonchi, symmetric air entry, no tachypnea, retractions or cyanosis  Heart - normal rate, regular rhythm, normal S1, S2, no murmurs, rubs, clicks or gallops  Abdomen - soft, nontender, nondistended, no masses or organomegaly  Extremities - peripheral pulses normal, no pedal edema, no clubbing or cyanosis  Intake and Output Summary (Last 24 hours) at Date Time        Intake/Output Summary (Last 24 hours) at 03/27/14 9562  Last data filed at 03/26/14 1800   Gross per 24 hour   Intake    720 ml   Output    300 ml   Net    420 ml           Labs:     Results    Procedure Component Value Units Date/Time    Blood Culture Aerobic/Anaerobic #1 [[id number]] Collected:  03/25/14 2109    Specimen Information:  Blood Updated:  03/27/14 0121    Narrative:      ORDER#: [id number]                                    ORDERED BY: Ander Purpura  SOURCE: Blood arm                                    COLLECTED:   03/25/14 21:09  ANTIBIOTICS AT COLL.:                                RECEIVED :  03/26/14 00:35  Culture Blood Aerobic and Anaerobic        PRELIM      03/27/14 01:21  03/27/14   No Growth after 1 day/s of incubation.      Blood Culture Aerobic/Anaerobic #2 [[id number]] Collected:  03/25/14 2134    Specimen Information:  Blood Updated:  03/27/14 0121    Narrative:      ORDER#: [id number]  ORDERED BY: Ander Purpura  SOURCE: Blood arm                                    COLLECTED:  03/25/14 21:34  ANTIBIOTICS AT COLL.:                                RECEIVED :  03/26/14 00:36  Culture Blood Aerobic and Anaerobic        PRELIM      03/27/14 01:21  03/27/14   No Growth after 1 day/s of incubation.      Folate [[id number]] Collected:  03/26/14 1145    Specimen Information:  Blood Updated:  03/26/14 1621     Folate 4.2 ng/mL     Vitamin B12 [[id number]]  (Abnormal) Collected:  03/26/14 1145    Specimen Information:  Blood Updated:  03/26/14 1615     Vitamin B-12 >2000 (H) pg/mL     Ferritin [[id number]]  (Abnormal) Collected:  03/26/14 1145    Specimen Information:  Blood Updated:  03/26/14 1607     Ferritin 1419.27 (H) ng/mL     IRON PROFILE [[id number]]  (Abnormal) Collected:  03/26/14 1145     Iron 64 ug/dL Updated:  72/53/66 4403     UIBC 142 ug/dL      TIBC 474 (L) ug/dL      Iron Saturation 31 %     Hemolysis index [[id number]] Collected:  03/26/14 1145     Hemolysis Index 5 Updated:  03/26/14 1546    Reticulocytes [[id number]]  (Abnormal) Collected:  03/26/14 1144    Specimen Information:  Blood Updated:  03/26/14 1208     Reticulocyte Count Automated 0.1 (L) %      Retic Ct Abs 0.0040 (L) x10 6/uL      Immature Retic Fract 0.0 (L) %      Immature Plt Fraction 4.6 %      Retic Hgb 30.2 pg     CBC without differential [[id number]]  (Abnormal) Collected:  03/26/14 0628    Specimen Information:  Blood / Blood Updated:  03/26/14 0836     WBC 0.72 (L) x10 3/uL      RBC 2.65 (L) x10 6/uL      Hgb  7.7 (L) g/dL      Hematocrit 16.0 (L) %      MCV 86.4 fL      MCH 29.1 pg      MCHC 33.6 g/dL      RDW 15 %      Platelets 94 (L) x10 3/uL      MPV 10.6 fL      Nucleated RBC 0 /100 WBC     Narrative:      Starting for 1 Occurrences    Comprehensive metabolic panel [[id number]]  (Abnormal) Collected:  03/26/14 0628    Specimen Information:  Blood Updated:  03/26/14 0721     Glucose 130 (H) mg/dL      BUN 7 mg/dL      Creatinine 0.5 (L) mg/dL      Sodium 322 (L) mEq/L      Potassium 4.0 mEq/L      Chloride 106 mEq/L      CO2 23 mEq/L      CALCIUM 8.2 mg/dL      Protein, Total 4.5 (L) g/dL  Albumin 2.7 (L) g/dL      AST (SGOT) 12 U/L      ALT <6 U/L      Alkaline Phosphatase 65 U/L      Bilirubin, Total 0.6 mg/dL      Globulin 1.8 (L) g/dL      Albumin/Globulin Ratio 1.5      Anion Gap 6.0     Narrative:      Starting for 1 Occurrences    GFR [[id number]] Collected:  03/26/14 0628     EGFR >60.0 Updated:  03/26/14 4098    Narrative:      Starting for 1 Occurrences            Rads:   Radiological Procedure reviewed.    Signed by: Marisa Sprinkles, MD    [id number] [phone]

## 2014-03-27 NOTE — Progress Notes (Signed)
Maureen Vasquez is a 80 y.o. female patient.  Active Problems:    Pneumonia    Past Medical History   Diagnosis Date   . H/O acute pancreatitis 1990's   . Back pain    . Seasonal allergic rhinitis    . NHL (non-Hodgkin's lymphoma) October 2013     w/ Splenic Nodules   . Lymphona, mantle cell, inguinal region/lower limb      Current Facility-Administered Medications   Medication Dose Route Frequency Provider Last Rate Last Dose   . acetaminophen (TYLENOL) tablet 650 mg  650 mg Oral Q4H PRN Maureen Vasquez, Maureen Perrell R, MD       . albuterol (PROVENTIL) nebulizer solution 2.5 mg  2.5 mg Nebulization Q6H PRN Maureen Sprinkles, MD       . benzonatate (TESSALON) capsule 100 mg  100 mg Oral TID PRN Maureen Vasquez, Shakeisha Horine R, MD       . dextrose 5 % and 0.9 % NaCl with KCl 20 mEq infusion   Intravenous Continuous Maureen Ponds, MD 125 mL/hr at 03/27/14 0650     . docusate sodium (COLACE) capsule 100 mg  100 mg Oral BID Maureen Capers R, MD   100 mg at 03/27/14 0931   . guaiFENesin (ROBITUSSIN) oral solution 200 mg  200 mg Oral Q4H PRN Maureen Vasquez, Lindzy Rupert R, MD       . HYDROmorphone (DILAUDID) injection 0.5 mg  0.5 mg Intravenous Q2H PRN Maureen Vasquez, Aloni Chuang R, MD       . magnesium hydroxide (MILK OF MAGNESIA) 400 MG/5ML suspension 30 mL  30 mL Oral QD PRN Maureen Capers R, MD       . metoprolol XL (TOPROL-XL) 24 hr tablet 25 mg  25 mg Oral Daily Maureen Capers R, MD   25 mg at 03/27/14 0931   . ondansetron (ZOFRAN) injection 4 mg  4 mg Intravenous QD PRN Maureen Vasquez, Maureen Smola R, MD       . piperacillin-tazobactam (ZOSYN) 4.5 g in sodium chloride 0.9 % 100 mL IVPB  4.5 g Intravenous Q8H Ramilo, Maureen B, MD   4.5 g at 03/27/14 0650   . polyethylene glycol (MIRALAX) packet 17 g  17 g Oral QD PRN Maureen Capers R, MD       . vancomycin (VANCOCIN) 750 mg in sodium chloride 0.9 % 150 mL IVPB  750 mg Intravenous Q24H Ramilo, Maureen B, MD 200 mL/hr at 03/26/14 1600 750 mg at 03/26/14 1600   . zolpidem (AMBIEN) tablet 5 mg  5 mg Oral QHS PRN Maureen Ponds, MD         Allergies    Allergen Reactions   . Aspirin Other (See Comments)     Oral and stomach irritation if taken daily   . Codeine Nausea Only   . Salicylates      Blood pressure 106/51, pulse 89, temperature 98.1 F (36.7 C), temperature source Oral, resp. rate 22, height 1.626 m (5\' 4" ), weight 50.939 kg (112 lb 4.8 oz), SpO2 94 %.    Subjective:  Symptoms:  She reports shortness of breath, malaise, cough and weakness.  No chest pain or diarrhea.    Diet:  Poor intake.  No nausea or vomiting.    Activity level: Impaired due to weakness.    Pain:  She complains of pain that is mild.  She reports pain is improving.  Pain is well controlled.    Review of Systems   Constitutional: Positive for fever, weight loss and malaise/fatigue. Negative for  chills.   HENT: Negative for congestion, ear discharge and tinnitus.    Eyes: Negative for blurred vision, double vision, photophobia and pain.   Respiratory: Positive for cough, sputum production and shortness of breath. Negative for wheezing.    Cardiovascular: Negative for chest pain, palpitations, orthopnea and leg swelling.   Gastrointestinal: Negative for nausea, vomiting, diarrhea and constipation.   Genitourinary: Negative for urgency, frequency and hematuria.   Musculoskeletal: Negative for back pain and neck pain.   Skin: Negative for itching and rash.   Neurological: Positive for dizziness and weakness. Negative for tingling, seizures and loss of consciousness.   Psychiatric/Behavioral: Negative for depression, suicidal ideas and memory loss. The patient does not have insomnia.          Objective:  General Appearance:  Comfortable, ill-appearing, in no acute distress and not in pain.    Vital signs: (most recent): Blood pressure 106/51, pulse 89, temperature 98.1 F (36.7 C), temperature source Oral, resp. rate 22, height 1.626 m (5\' 4" ), weight 50.939 kg (112 lb 4.8 oz), SpO2 94 %.  Vital signs are normal.  Fever.    Output: Producing urine and producing stool.    HEENT: Normal  HEENT exam.    Lungs:  Increased effort.  Breath sounds clear to auscultation.  There are decreased breath sounds.  No wheezes or rhonchi.    Heart: Normal rate.  S1 normal.  No murmur, gallop or friction rub.   Extremities: Normal range of motion.  There is no deformity, effusion, local swelling or dependent edema.    Neurological: Patient is alert.  Patient has normal reflexes, normal muscle tone and normal coordination.    Skin:  Warm, dry and pale.  No rash, ecchymosis or cyanosis.   Abdomen: Abdomen is soft.  Bowel sounds are normal.   There is no abdominal tenderness tenderness.   There is no mass. There is splenomegaly. There is no hepatomegaly.   Pupils:  Pupils are equal, round, and reactive to light.    Pulses: Distal pulses are intact.    MDM  Number of Diagnoses or Management Options  Pneumonia: established, improving     Amount and/or Complexity of Data Reviewed  Clinical lab tests: reviewed  Tests in the radiology section of CPT: reviewed  Tests in the medicine section of CPT: reviewed  Discussion of test results with the performing providers: yes  Decide to obtain previous medical records or to obtain history from someone other than the patient: yes  Obtain history from someone other than the patient: yes  Review and summarize past medical records: yes  Discuss the patient with other providers: yes  Independent visualization of images, tracings, or specimens: yes    Risk of Complications, Morbidity, and/or Mortality  Presenting problems: high  Diagnostic procedures: high  Management options: high    Critical Care  Total time providing critical care: 30-74 minutes    Patient Progress  Patient progress: improved        Assessment:  (S/P Vasquez -CHOP for NHL  Pancytopenia related to chemo s/p neulesta last week after chemo  Left pneumonia  ppresyncope  dehydration.  Anorexia  ).     Plan:   Consults: pulmonology (ID,hem).  Regular diet.  Give fluids.   (IVF  Zosyn/vanc  Nutrition  Watch counts  D/W  husband  Called consults  Mar 27, 2014 chest x-ray shows the pneumonia being a little bit worse, however.  Appetite is coming up.  Will put her on Remeron.  Nutrition has seen her already watch the counts  ).       Baltazar Pekala Vasquez Maureen Vasquez  03/27/2014

## 2014-03-27 NOTE — Progress Notes (Signed)
[facility] ID PROGRESS NOTE    Date Time: 03/27/2014 1:05 PM  Patient Name: Maureen Vasquez, Maureen Vasquez      Assessment:   Febrile neutropenia  Pneumonia on cxr  S/p CHOP last wk  Well known from previous admissions  Cultures neg to date  vanco/zosyn    Plan:    Continue vanco / zosyn   Cultures remain neg   Breathing comfortably on room air   Still neutropenic   Continue excellent nursing supportative care    Subjective:   Feeling better, less sob  Constipated  Poor appetite    Medications:     Current Facility-Administered Medications   Medication Dose Route Frequency   . docusate sodium  100 mg Oral BID   . metoprolol XL  25 mg Oral Daily   . piperacillin-tazobactam  4.5 g Intravenous Q8H   . vancomycin  750 mg Intravenous Q24H       Review of Systems:   A comprehensive review of systems was: Negative except contstipated    Physical Exam:     Filed Vitals:    03/27/14 1106   BP: 117/49   Pulse: 89   Temp: 96.1 F (35.6 C)   Resp: 18   SpO2: 100%       Intake and Output Summary (Last 24 hours) at Date Time    Intake/Output Summary (Last 24 hours) at 03/27/14 1305  Last data filed at 03/26/14 1800   Gross per 24 hour   Intake    480 ml   Output      0 ml   Net    480 ml       General appearance - alert, well appearing, and in no distress  Mental status - alert, oriented to person, place, and time  Eyes - pupils equal and reactive, extraocular eye movements intact  Ears - bilateral TM's and external ear canals normal  Nose - normal and patent, no erythema, discharge or polyps  Mouth - mucous membranes moist, pharynx normal without lesions  Neck - supple, no significant adenopathy  Lymphatics - no palpable lymphadenopathy  Chest - clear to auscultation, no wheezes, rales or rhonchi, symmetric air entry, decreased air entry noted bases  Heart - normal rate, regular rhythm, normal S1, S2, no murmurs, rubs, clicks or gallops  Abdomen - soft, nontender, nondistended, no masses or organomegaly  Rectal - deferred, not clinically  indicated  Back exam - not examined  Neurological - alert, oriented, normal speech, no focal findings or movement disorder noted  Musculoskeletal - no joint tenderness, deformity or swelling  Extremities - peripheral pulses normal, no pedal edema, no clubbing or cyanosis  Skin - normal coloration and turgor, no rashes, no suspicious skin lesions noted      Labs:     Results    Procedure Component Value Units Date/Time    CBC without differential [[id number]]  (Abnormal) Collected:  03/27/14 0656    Specimen Information:  Blood / Blood Updated:  03/27/14 1011     WBC 0.66 (L) x10 3/uL      RBC 2.70 (L) x10 6/uL      Hgb 8.1 (L) g/dL      Hematocrit 55.7 (L) %      MCV 88.1 fL      MCH 30.0 pg      MCHC 34.0 g/dL      RDW 15 %      Platelets 79 (L) x10 3/uL      MPV 11.1  fL      Nucleated RBC 0 /100 WBC      Absolute NRBC 0.00 x10 3/uL     Narrative:      Starting for 1 Occurrences    Urine culture [[id number]] Collected:  03/26/14 0311    Specimen Information:  Urine / Urine, Clean Catch Updated:  03/27/14 0926    Narrative:      ORDER#: [id number]                                    ORDERED BY: Ander Purpura  SOURCE: Urine, Clean Catch                           COLLECTED:  03/26/14 03:11  ANTIBIOTICS AT COLL.:                                RECEIVED :  03/26/14 05:37  Culture Urine                              FINAL       03/27/14 09:26  03/27/14   No growth of >1,000 CFU/ML, No further work      Comprehensive metabolic panel [[id number]]  (Abnormal) Collected:  03/27/14 0656    Specimen Information:  Blood Updated:  03/27/14 0906     Glucose 87 mg/dL      BUN 2 (L) mg/dL      Creatinine 0.5 (L) mg/dL      Sodium 956 (L) mEq/L      Potassium 3.9 mEq/L      Chloride 105 mEq/L      CO2 21 (L) mEq/L      CALCIUM 8.3 mg/dL      Protein, Total 4.5 (L) g/dL      Albumin 2.7 (L) g/dL      AST (SGOT) 23 U/L      ALT 12 U/L      Alkaline Phosphatase 72 U/L      Bilirubin, Total 0.5 mg/dL      Globulin 1.8 (L) g/dL       Albumin/Globulin Ratio 1.5      Anion Gap 7.0     Narrative:      Starting for 1 Occurrences    GFR [[id number]] Collected:  03/27/14 0656     EGFR >60.0 Updated:  03/27/14 9518    Narrative:      Starting for 1 Occurrences    Blood Culture Aerobic/Anaerobic #1 [[id number]] Collected:  03/25/14 2109    Specimen Information:  Blood Updated:  03/27/14 0121    Narrative:      ORDER#: [id number]                                    ORDERED BY: Ander Purpura  SOURCE: Blood arm                                    COLLECTED:  03/25/14 21:09  ANTIBIOTICS AT COLL.:  RECEIVED :  03/26/14 00:35  Culture Blood Aerobic and Anaerobic        PRELIM      03/27/14 01:21  03/27/14   No Growth after 1 day/s of incubation.      Blood Culture Aerobic/Anaerobic #2 [[id number]] Collected:  03/25/14 2134    Specimen Information:  Blood Updated:  03/27/14 0121    Narrative:      ORDER#: [id number]                                    ORDERED BY: Ander Purpura  SOURCE: Blood arm                                    COLLECTED:  03/25/14 21:34  ANTIBIOTICS AT COLL.:                                RECEIVED :  03/26/14 00:36  Culture Blood Aerobic and Anaerobic        PRELIM      03/27/14 01:21  03/27/14   No Growth after 1 day/s of incubation.      Folate [[id number]] Collected:  03/26/14 1145    Specimen Information:  Blood Updated:  03/26/14 1621     Folate 4.2 ng/mL     Vitamin B12 [[id number]]  (Abnormal) Collected:  03/26/14 1145    Specimen Information:  Blood Updated:  03/26/14 1615     Vitamin B-12 >2000 (H) pg/mL     Ferritin [[id number]]  (Abnormal) Collected:  03/26/14 1145    Specimen Information:  Blood Updated:  03/26/14 1607     Ferritin 1419.27 (H) ng/mL     IRON PROFILE [[id number]]  (Abnormal) Collected:  03/26/14 1145     Iron 64 ug/dL Updated:  18/84/16 6063     UIBC 142 ug/dL      TIBC 016 (L) ug/dL      Iron Saturation 31 %     Hemolysis index [[id number]] Collected:  03/26/14 1145     Hemolysis Index 5 Updated:   03/26/14 1546          Recent CBC   Recent Labs      03/27/14   0656   RBC  2.70*   Hgb  8.1*   Hematocrit  23.8*   MCV  88.1   MCH  30.0   MCHC  34.0   RDW  15   MPV  11.1     Recent BMP   Recent Labs      03/27/14   0656   Glucose  87   BUN  2*   Creatinine  0.5*   CALCIUM  8.3   Sodium  133*   Potassium  3.9   Chloride  105   CO2  21*       Rads:     XR Chest 2 Views [[id number]] Collected: 03/27/14 0824     Order Status: Completed  Updated: 03/27/14 0837    Narrative:      History: Pneumonia    Comparison 03/25/2014    FINDINGS: PA and lateral chest demonstrate right-sided Mediport with  catheter tip in the mid SVC are hyperinflated with flattening of the  diaphragm. Patchy opacities in the left hilum and left lower lobe,  laterally.  Left lower lobe opacities have progressed with poor  definition to the diaphragm. Stable cardiac silhouette and vasculature.  Calcifications in the aortic knob.      Impression:      Slight progression of left lower lobe pneumonia    Genelle Bal, MD   03/27/2014 8:33 AM      Chest 2 Views [[id number]] Collected: 03/25/14 2208    Order Status: Completed  Updated: 03/25/14 2213    Narrative:      Clinical history. Fever.    Portable chest demonstrates some basilar atelectasis on the left base  and in the left perihilar region. Central venous catheter is in good  position. The right lung is clear. The heart is not remarkable.      Impression:      Linear perihilar left lung base densities early infiltrate  cannot be excluded.    Bernarda Caffey, MD   03/25/2014 10:09 PM         Signed by: Threasa Beards

## 2014-03-27 NOTE — Progress Notes (Signed)
HEMATOLOGY ONCOLOGY PROGRESS NOTE    Date Time: 03/27/2014 9:56 AM  Patient Name: Maureen Vasquez, Maureen Vasquez    Active Problems:    Pneumonia      HISTORY:    85 yt/o s/p cycle 5 of dose reduced R-CHOP for DLBCL with neulasta support, again admitted with febrile neutropenia, and with evidence of pneumonia. Currently afebrile, on vancomycin and zosyn. Afebrile overnight, cultures negative.        PHYSICAL EXAM:     Filed Vitals:    03/27/14 0739   BP: 106/51   Pulse: 89   Temp: 98.1 F (36.7 C)   Resp: 22   SpO2: 94%       Intake and Output Summary (Last 24 hours) at Date Time    Intake/Output Summary (Last 24 hours) at 03/27/14 0956  Last data filed at 03/26/14 1800   Gross per 24 hour   Intake    480 ml   Output      0 ml   Net    480 ml         Appearance: in no acute distress  Skin: no rash  HEENT: no icterus,  Lungs: clear to percussion and ausculation  Heart: regular rate and rhythm  Abdomen: no tenderness, no organomegaly or mass  Extremities: no edema  Neuro: alert, strength grossly intact        ASSESSMENT/PLAN:       1) Febrile neutropenia with pneumonia: on antibiotics, follow cultures. Received neulasta in clinic. ID following. CBC today pending.  2) DLBCL: will f/u with Dr. Noralee Chars after discharge      Valda Lamb, MD  [facility]  [phone]    MEDS:   Scheduled Meds:  Current Facility-Administered Medications   Medication Dose Route Frequency   . docusate sodium  100 mg Oral BID   . metoprolol XL  25 mg Oral Daily   . piperacillin-tazobactam  4.5 g Intravenous Q8H   . vancomycin  750 mg Intravenous Q24H     Continuous Infusions:  . dextrose 5 % and 0.9 % NaCl with KCl 20 mEq 125 mL/hr at 03/27/14 0650     PRN Meds:.acetaminophen, albuterol, benzonatate, guaiFENesin, HYDROmorphone, magnesium hydroxide, ondansetron, polyethylene glycol, zolpidem     LABS:     Results    Procedure Component Value Units Date/Time    Urine culture [[id number]] Collected:  03/26/14 0311    Specimen  Information:  Urine / Urine, Clean Catch Updated:  03/27/14 0926    Narrative:      ORDER#: [id number]                                    ORDERED BY: Ander Purpura  SOURCE: Urine, Clean Catch                           COLLECTED:  03/26/14 03:11  ANTIBIOTICS AT COLL.:                                RECEIVED :  03/26/14 05:37  Culture Urine                              FINAL       03/27/14 09:26  03/27/14   No growth of >  1,000 CFU/ML, No further work      Comprehensive metabolic panel [[id number]]  (Abnormal) Collected:  03/27/14 0656    Specimen Information:  Blood Updated:  03/27/14 0906     Glucose 87 mg/dL      BUN 2 (L) mg/dL      Creatinine 0.5 (L) mg/dL      Sodium 409 (L) mEq/L      Potassium 3.9 mEq/L      Chloride 105 mEq/L      CO2 21 (L) mEq/L      CALCIUM 8.3 mg/dL      Protein, Total 4.5 (L) g/dL      Albumin 2.7 (L) g/dL      AST (SGOT) 23 U/L      ALT 12 U/L      Alkaline Phosphatase 72 U/L      Bilirubin, Total 0.5 mg/dL      Globulin 1.8 (L) g/dL      Albumin/Globulin Ratio 1.5      Anion Gap 7.0     Narrative:      Starting for 1 Occurrences    GFR [[id number]] Collected:  03/27/14 0656     EGFR >60.0 Updated:  03/27/14 9562    Narrative:      Starting for 1 Occurrences    CBC without differential [[id number]] Collected:  03/27/14 0656    Specimen Information:  Blood / Blood Updated:  03/27/14 0900    Narrative:      Starting for 1 Occurrences    Blood Culture Aerobic/Anaerobic #1 [[id number]] Collected:  03/25/14 2109    Specimen Information:  Blood Updated:  03/27/14 0121    Narrative:      ORDER#: [id number]                                    ORDERED BY: Ander Purpura  SOURCE: Blood arm                                    COLLECTED:  03/25/14 21:09  ANTIBIOTICS AT COLL.:                                RECEIVED :  03/26/14 00:35  Culture Blood Aerobic and Anaerobic        PRELIM      03/27/14 01:21  03/27/14   No Growth after 1 day/s of incubation.      Blood Culture Aerobic/Anaerobic #2 [[id number]]  Collected:  03/25/14 2134    Specimen Information:  Blood Updated:  03/27/14 0121    Narrative:      ORDER#: [id number]                                    ORDERED BY: Ander Purpura  SOURCE: Blood arm                                    COLLECTED:  03/25/14 21:34  ANTIBIOTICS AT COLL.:  RECEIVED :  03/26/14 00:36  Culture Blood Aerobic and Anaerobic        PRELIM      03/27/14 01:21  03/27/14   No Growth after 1 day/s of incubation.      Folate [[id number]] Collected:  03/26/14 1145    Specimen Information:  Blood Updated:  03/26/14 1621     Folate 4.2 ng/mL     Vitamin B12 [[id number]]  (Abnormal) Collected:  03/26/14 1145    Specimen Information:  Blood Updated:  03/26/14 1615     Vitamin B-12 >2000 (H) pg/mL     Ferritin [[id number]]  (Abnormal) Collected:  03/26/14 1145    Specimen Information:  Blood Updated:  03/26/14 1607     Ferritin 1419.27 (H) ng/mL     IRON PROFILE [[id number]]  (Abnormal) Collected:  03/26/14 1145     Iron 64 ug/dL Updated:  62/95/28 4132     UIBC 142 ug/dL      TIBC 440 (L) ug/dL      Iron Saturation 31 %     Hemolysis index [[id number]] Collected:  03/26/14 1145     Hemolysis Index 5 Updated:  03/26/14 1546    Reticulocytes [[id number]]  (Abnormal) Collected:  03/26/14 1144    Specimen Information:  Blood Updated:  03/26/14 1208     Reticulocyte Count Automated 0.1 (L) %      Retic Ct Abs 0.0040 (L) x10 6/uL      Immature Retic Fract 0.0 (L) %      Immature Plt Fraction 4.6 %      Retic Hgb 30.2 pg           IMAGING DATA:  Radiology Results (24 Hour)    Procedure Component Value Units Date/Time    XR Chest 2 Views [[id number]] Collected:  03/27/14 0824    Order Status:  Completed  Updated:  03/27/14 0837    Narrative:      History: Pneumonia    Comparison 03/25/2014    FINDINGS: PA and lateral chest demonstrate right-sided Mediport with  catheter tip in the mid SVC are hyperinflated with flattening of the  diaphragm. Patchy opacities in the left hilum and left lower  lobe,  laterally. Left lower lobe opacities have progressed with poor  definition to the diaphragm. Stable cardiac silhouette and vasculature.  Calcifications in the aortic knob.      Impression:       Slight progression of left lower lobe pneumonia    Genelle Bal, MD   03/27/2014 8:33 AM

## 2014-03-27 NOTE — Progress Notes (Signed)
[facility] Consult    Introduced myself to pt and explained the role of a nurse navigator in addition to [facility] services. Pt stated she has completed 5 rounds of chemo and only has one left. She expressed interest in yoga/exercise programs through [facility] and I informed her that our services remain available even after treatment is complete. Gave pt monthly calendar and my contact information. Encouraged pt to reach out at any time.    Maureen Vasquez  Oncology Nurse Navigator  [phone]

## 2014-03-27 NOTE — Plan of Care (Signed)
Problem: Moderate/High Fall Risk Score >/=15  Goal: Patient will remain free of falls  Pt has been calling for assistance to go to the bathroom. Gait steady. Stand by assist. Call bell within reach. Bed alarm on for safety.

## 2014-03-28 ENCOUNTER — Other Ambulatory Visit: Payer: Self-pay

## 2014-03-28 LAB — COMPREHENSIVE METABOLIC PANEL
ALT: 12 U/L (ref 0–55)
AST (SGOT): 23 U/L (ref 5–34)
Albumin/Globulin Ratio: 1.3 (ref 0.9–2.2)
Albumin: 2.5 g/dL — ABNORMAL LOW (ref 3.5–5.0)
Alkaline Phosphatase: 74 U/L (ref 37–106)
Anion Gap: 8 (ref 5.0–15.0)
BUN: 3 mg/dL — ABNORMAL LOW (ref 7–19)
Bilirubin, Total: 0.4 mg/dL (ref 0.2–1.2)
CO2: 21 mEq/L — ABNORMAL LOW (ref 22–29)
Calcium: 8.3 mg/dL (ref 7.9–10.2)
Chloride: 103 mEq/L (ref 100–111)
Creatinine: 0.6 mg/dL (ref 0.6–1.0)
Globulin: 1.9 g/dL — ABNORMAL LOW (ref 2.0–3.6)
Glucose: 105 mg/dL — ABNORMAL HIGH (ref 70–100)
Potassium: 4 mEq/L (ref 3.5–5.1)
Protein, Total: 4.4 g/dL — ABNORMAL LOW (ref 6.0–8.3)
Sodium: 132 mEq/L — ABNORMAL LOW (ref 136–145)

## 2014-03-28 LAB — CBC
Hematocrit: 22.8 % — ABNORMAL LOW (ref 37.0–47.0)
Hgb: 7.8 g/dL — ABNORMAL LOW (ref 12.0–16.0)
MCH: 30 pg (ref 28.0–32.0)
MCHC: 34.2 g/dL (ref 32.0–36.0)
MCV: 87.7 fL (ref 80.0–100.0)
MPV: 11.1 fL (ref 9.4–12.3)
Nucleated RBC: 0 /100 WBC (ref 0–1)
Platelets: 72 10*3/uL — ABNORMAL LOW (ref 140–400)
RBC: 2.6 10*6/uL — ABNORMAL LOW (ref 4.20–5.40)
RDW: 15 % (ref 12–15)
WBC: 1.72 10*3/uL — ABNORMAL LOW (ref 3.50–10.80)

## 2014-03-28 LAB — GFR: EGFR: >60

## 2014-03-28 MED ORDER — BENZONATATE 100 MG PO CAPS
100.0000 mg | ORAL_CAPSULE | Freq: Three times a day (TID) | ORAL | 0 refills | Status: DC | PRN
Start: 2014-03-28 — End: 2021-03-05
  Filled 2014-03-28: qty 20, 7d supply, fill #0

## 2014-03-28 MED ORDER — AMOXICILLIN-POT CLAVULANATE 875-125 MG PO TABS
1.0000 | ORAL_TABLET | Freq: Two times a day (BID) | ORAL | 0 refills | Status: DC
Start: 2014-03-28 — End: 2018-03-01
  Filled 2014-03-28: qty 20, 10d supply, fill #0

## 2014-03-28 MED ORDER — MIRTAZAPINE 15 MG PO TABS
15.0000 mg | ORAL_TABLET | Freq: Every evening | ORAL | 0 refills | Status: DC
Start: 2014-03-28 — End: 2021-03-05
  Filled 2014-03-28: qty 30, 30d supply, fill #0

## 2014-03-28 MED ORDER — RISAQUAD PO CAPS
1.0000 | ORAL_CAPSULE | Freq: Every day | ORAL | 0 refills | Status: DC
Start: 2014-03-28 — End: 2021-03-05
  Filled 2014-03-28: qty 30, 30d supply, fill #0

## 2014-03-28 MED ORDER — MIRTAZAPINE 15 MG PO TABS
15.0000 mg | ORAL_TABLET | Freq: Every evening | ORAL | Status: DC
Start: 2014-03-28 — End: 2014-03-28

## 2014-03-28 NOTE — Progress Notes (Signed)
Nutritional Support Services  Nutrition Follow Up    Maureen Vasquez 80 y.o. female   MRN: [id number]        Nutrition Summary: Pt reports improved po intake. Ate ~ 50% of dinner last night and breakfast this a.m.  Also drinking a milkshake once /day.    Nutrition Diagnosis:   Severe Protein Energy Malnutrition related to chronic illness as evidenced by % intake < 75% of needs for > 1 month, wt loss > 10 % in 6 months.    Intervention:  1. Continue current nutrition plan of care. No new interventions at this time.       Goal: Avoid further wt loss.  PO intake to meet >= 75% of estimated needs.      Assessment Data:  Adm dx:  Fever, PNA   Patient Active Problem List   Diagnosis   . Low back ache   . Neutropenia   . Syncope and collapse   . NHL (non-Hodgkin's lymphoma)   . Generalized weakness   . Pneumonia       :  Recent Labs  Lab 03/28/14  0334 03/27/14  0656 03/26/14  0628 03/25/14  2104   Sodium 132* 133* 135* 128*   Potassium 4.0 3.9 4.0 3.8   Chloride 103 105 106 95*   CO2 21* 21* 23 21*   BUN 3* 2* 7 11   Creatinine 0.6 0.5* 0.5* 0.7   Glucose 105* 87 130* 128*   CALCIUM 8.3 8.3 8.2 8.9       Pertinent meds: IVF @ 125 mL/hr      Current Diet Order  Diet regular        Anthropometrics  Height: 162.6 cm (5\' 4" )  Weight: 50.939 kg (112 lb 4.8 oz)  Weight Change: 2.09  IBW/kg (Calculated) Female: 54.54 kg  BMI (calculated): 19.3        Estimated Energy Needs  Total Energy Estimated Needs: 1275 - 1780 kcal  Method for Estimating Needs: 25 - 35 kcal/kg         Estimated Protein Needs  Total Protein Estimated Needs: 51 - 61 gm pro  Method for Estimating Needs: 1.0 - 1.2 gm/kg          Fluid Needs  Total Fluid Estimated Needs: 1530 - 2035 mL  Method for Estimating Needs: 30 - 40 mL/kg         Monitoring/Evaluation:   1. PO intake  2. Weights        Fransisca Kaufmann, [place]  Ext. [phone]

## 2014-03-28 NOTE — Progress Notes (Signed)
PROGRESS NOTE    Date Time: 03/28/2014 6:52 AM  Patient Name: Maureen Vasquez, Maureen Vasquez      Assessment:   1. Non Hodgkins Lymphoma  2. Neutropenic Fever  3. LT. Lung pneumonia  4. Allergies  Asa/codeine  5. 5/6 cycles CHOP completed 03-19-2014    Plan:   1. Levaquin #3 DCED  2. Zosyn #4  3. Vanco #3  4. nebs  5. Chest xray  6. IV Fluids per attending 125 cc/hr D5NS with 20 meq K+    Subjective:   Feeling better since admission. Feeling stronger after rehydration. Sl. Progression of LLL pneumonia on chest xray from 03-27-2014. NG on cultures so far    Medications:     Current Facility-Administered Medications   Medication Dose Route Frequency   . docusate sodium  100 mg Oral BID   . metoprolol XL  25 mg Oral Daily   . piperacillin-tazobactam  4.5 g Intravenous Q8H   . vancomycin  750 mg Intravenous Q24H         Physical Exam:     Filed Vitals:    03/28/14 0338   BP: 123/61   Pulse: 112   Temp: 99.9 F (37.7 C)   Resp: 19   SpO2: 96%       General appearance - alert, chronically ill appearing, and in no distress, oriented to person, place, and time and acyanotic, in no respiratory distress  HEENT:  PERRLA  Mental status - alert, oriented to person, place, and time  Chest - diminished breath sounds, few rhonchi, symmetric air entry, no tachypnea, retractions or cyanosis  Heart - normal rate, regular rhythm, normal S1, S2, no murmurs, rubs, clicks or gallops  Abdomen - soft, nontender, nondistended, no masses or organomegaly  Extremities - peripheral pulses normal, no pedal edema, no clubbing or cyanosis  Intake and Output Summary (Last 24 hours) at Date Time      No intake or output data in the 24 hours ending 03/28/14 0652        Labs:     Results    Procedure Component Value Units Date/Time    Comprehensive metabolic panel [[id number]]  (Abnormal) Collected:  03/28/14 0334    Specimen Information:  Blood Updated:  03/28/14 0411     Glucose 105 (H) mg/dL      BUN 3 (L) mg/dL      Creatinine 0.6 mg/dL      Sodium 409 (L) mEq/L       Potassium 4.0 mEq/L      Chloride 103 mEq/L      CO2 21 (L) mEq/L      CALCIUM 8.3 mg/dL      Protein, Total 4.4 (L) g/dL      Albumin 2.5 (L) g/dL      AST (SGOT) 23 U/L      ALT 12 U/L      Alkaline Phosphatase 74 U/L      Bilirubin, Total 0.4 mg/dL      Globulin 1.9 (L) g/dL      Albumin/Globulin Ratio 1.3      Anion Gap 8.0     Narrative:      Starting for 1 Occurrences    GFR [[id number]] Collected:  03/28/14 0334     EGFR >60.0 Updated:  03/28/14 0411    Narrative:      Starting for 1 Occurrences    CBC without differential [[id number]]  (Abnormal) Collected:  03/28/14 0334    Specimen Information:  Blood /  Blood Updated:  03/28/14 0354     WBC 1.72 (L) x10 3/uL      RBC 2.60 (L) x10 6/uL      Hgb 7.8 (L) g/dL      Hematocrit 36.6 (L) %      MCV 87.7 fL      MCH 30.0 pg      MCHC 34.2 g/dL      RDW 15 %      Platelets 72 (L) x10 3/uL      MPV 11.1 fL      Nucleated RBC 0 /100 WBC     Narrative:      Starting for 1 Occurrences    Blood Culture Aerobic/Anaerobic #1 [[id number]] Collected:  03/25/14 2109    Specimen Information:  Blood Updated:  03/28/14 0121    Narrative:      ORDER#: [id number]                                    ORDERED BY: Ander Purpura  SOURCE: Blood arm                                    COLLECTED:  03/25/14 21:09  ANTIBIOTICS AT COLL.:                                RECEIVED :  03/26/14 00:35  Culture Blood Aerobic and Anaerobic        PRELIM      03/28/14 01:21  03/27/14   No Growth after 1 day/s of incubation.  03/28/14   No Growth after 2 day/s of incubation.      Blood Culture Aerobic/Anaerobic #2 [[id number]] Collected:  03/25/14 2134    Specimen Information:  Blood Updated:  03/28/14 0121    Narrative:      ORDER#: [id number]                                    ORDERED BY: Ander Purpura  SOURCE: Blood arm                                    COLLECTED:  03/25/14 21:34  ANTIBIOTICS AT COLL.:                                RECEIVED :  03/26/14 00:36  Culture Blood Aerobic and Anaerobic        PRELIM       03/28/14 01:21  03/27/14   No Growth after 1 day/s of incubation.  03/28/14   No Growth after 2 day/s of incubation.      CBC without differential [[id number]]  (Abnormal) Collected:  03/27/14 0656    Specimen Information:  Blood / Blood Updated:  03/27/14 1011     WBC 0.66 (L) x10 3/uL      RBC 2.70 (L) x10 6/uL      Hgb 8.1 (L) g/dL      Hematocrit 73.2 (L) %      MCV 88.1 fL      MCH 30.0 pg  MCHC 34.0 g/dL      RDW 15 %      Platelets 79 (L) x10 3/uL      MPV 11.1 fL      Nucleated RBC 0 /100 WBC      Absolute NRBC 0.00 x10 3/uL     Narrative:      Starting for 1 Occurrences    Urine culture [[id number]] Collected:  03/26/14 0311    Specimen Information:  Urine / Urine, Clean Catch Updated:  03/27/14 0926    Narrative:      ORDER#: [id number]                                    ORDERED BY: Ander Purpura  SOURCE: Urine, Clean Catch                           COLLECTED:  03/26/14 03:11  ANTIBIOTICS AT COLL.:                                RECEIVED :  03/26/14 05:37  Culture Urine                              FINAL       03/27/14 09:26  03/27/14   No growth of >1,000 CFU/ML, No further work      Comprehensive metabolic panel [[id number]]  (Abnormal) Collected:  03/27/14 0656    Specimen Information:  Blood Updated:  03/27/14 0906     Glucose 87 mg/dL      BUN 2 (L) mg/dL      Creatinine 0.5 (L) mg/dL      Sodium 629 (L) mEq/L      Potassium 3.9 mEq/L      Chloride 105 mEq/L      CO2 21 (L) mEq/L      CALCIUM 8.3 mg/dL      Protein, Total 4.5 (L) g/dL      Albumin 2.7 (L) g/dL      AST (SGOT) 23 U/L      ALT 12 U/L      Alkaline Phosphatase 72 U/L      Bilirubin, Total 0.5 mg/dL      Globulin 1.8 (L) g/dL      Albumin/Globulin Ratio 1.5      Anion Gap 7.0     Narrative:      Starting for 1 Occurrences    GFR [[id number]] Collected:  03/27/14 0656     EGFR >60.0 Updated:  03/27/14 0102    Narrative:      Starting for 1 Occurrences            Rads:   Radiological Procedure reviewed.    Signed by: Marisa Sprinkles,  MD    [phone]

## 2014-03-28 NOTE — Progress Notes (Signed)
Pt given [place] instructions with verbalized understanding and dcd to home with husband in no distress

## 2014-03-28 NOTE — Progress Notes (Signed)
PROGRESS NOTE    Date Time: 03/28/2014 1:20 PM  Patient Name: Maureen Vasquez, Maureen Vasquez      Assessment:   Febrile neutropenia  Pneumonia on cxr  S/p CHOP last wk  Well known from previous admissions  Cultures neg to date  vanco/zosyn    Plan:     Spoke with Dr Allena Katz  Patient doing well  No fever  Cultures remain negative  Wbc increased to 1.7  Change to po augmentin for another 10 days    Subjective:   Patient feels much better  Only a mild nonproductive cough  No SOB      Past Medical History   Diagnosis Date   . H/O acute pancreatitis 1990's   . Back pain    . Seasonal allergic rhinitis    . NHL (non-Hodgkin's lymphoma) October 2013     w/ Splenic Nodules   . Lymphona, mantle cell, inguinal region/lower limb          Active Problems:    Pneumonia        Allergies as of 03/25/2014 Loraine Leriche as Reviewed 03/25/2014   Allergen Reaction Noted   . Aspirin Other (See Comments) 11/16/2011   . Codeine Nausea Only 11/16/2011   . Salicylates  12/23/2013       Medications:     Current Facility-Administered Medications   Medication Dose Route Frequency   . docusate sodium  100 mg Oral BID   . metoprolol XL  25 mg Oral Daily   . mirtazapine  15 mg Oral QHS   . piperacillin-tazobactam  4.5 g Intravenous Q8H   . vancomycin  750 mg Intravenous Q24H       Review of Systems:   Negative for fever, sinus symptoms, sore throat,shortness of breath, nausea, vomiting, diarrhea, dysuria, lower extremity edema.    Physical Exam:     Filed Vitals:    03/28/14 1156   BP: 102/45   Pulse: 91   Temp: 97.7 F (36.5 C)   Resp: 19   SpO2: 94%       Intake and Output Summary (Last 24 hours) at Date Time  No intake or output data in the 24 hours ending 03/28/14 1320    No Acute Distress  Eyes Anicteric  Throat Clear  Neck Supple  Lungs Clear to Auscultation  Heart Regular Rate and Rhythm  Abdomen Soft, Nontender, Normal Bowel Sounds  No Lower Extremity Edema      Labs:     Results    Procedure Component Value Units Date/Time    Comprehensive metabolic panel  [[id number]]  (Abnormal) Collected:  03/28/14 0334    Specimen Information:  Blood Updated:  03/28/14 0411     Glucose 105 (H) mg/dL      BUN 3 (L) mg/dL      Creatinine 0.6 mg/dL      Sodium 409 (L) mEq/L      Potassium 4.0 mEq/L      Chloride 103 mEq/L      CO2 21 (L) mEq/L      CALCIUM 8.3 mg/dL      Protein, Total 4.4 (L) g/dL      Albumin 2.5 (L) g/dL      AST (SGOT) 23 U/L      ALT 12 U/L      Alkaline Phosphatase 74 U/L      Bilirubin, Total 0.4 mg/dL      Globulin 1.9 (L) g/dL      Albumin/Globulin Ratio 1.3  Anion Gap 8.0     Narrative:      Starting for 1 Occurrences    GFR [[id number]] Collected:  03/28/14 0334     EGFR >60.0 Updated:  03/28/14 0411    Narrative:      Starting for 1 Occurrences    CBC without differential [[id number]]  (Abnormal) Collected:  03/28/14 0334    Specimen Information:  Blood / Blood Updated:  03/28/14 0354     WBC 1.72 (L) x10 3/uL      RBC 2.60 (L) x10 6/uL      Hgb 7.8 (L) g/dL      Hematocrit 78.2 (L) %      MCV 87.7 fL      MCH 30.0 pg      MCHC 34.2 g/dL      RDW 15 %      Platelets 72 (L) x10 3/uL      MPV 11.1 fL      Nucleated RBC 0 /100 WBC     Narrative:      Starting for 1 Occurrences    Blood Culture Aerobic/Anaerobic #1 [[id number]] Collected:  03/25/14 2109    Specimen Information:  Blood Updated:  03/28/14 0121    Narrative:      ORDER#: [id number]                                    ORDERED BY: Ander Purpura  SOURCE: Blood arm                                    COLLECTED:  03/25/14 21:09  ANTIBIOTICS AT COLL.:                                RECEIVED :  03/26/14 00:35  Culture Blood Aerobic and Anaerobic        PRELIM      03/28/14 01:21  03/27/14   No Growth after 1 day/s of incubation.  03/28/14   No Growth after 2 day/s of incubation.      Blood Culture Aerobic/Anaerobic #2 [[id number]] Collected:  03/25/14 2134    Specimen Information:  Blood Updated:  03/28/14 0121    Narrative:      ORDER#: [id number]                                    ORDERED BY: Ander Purpura  SOURCE: Blood arm                                    COLLECTED:  03/25/14 21:34  ANTIBIOTICS AT COLL.:                                RECEIVED :  03/26/14 00:36  Culture Blood Aerobic and Anaerobic        PRELIM      03/28/14 01:21  03/27/14   No Growth after 1 day/s of incubation.  03/28/14   No Growth after 2 day/s of incubation.  Rads:     Radiology Results (24 Hour)    ** No results found for the last 24 hours. **          Signed by: Kearney Hard

## 2014-03-28 NOTE — Plan of Care (Signed)
Problem: Urinary Incontinence  Goal: Perineal skin integrity is maintained or improved  Assess genitourinary system, perineal skin, labs (urinalysis), and history of incontinence to include past management, aggravating, and alleviating factors. Collaborate with interdisciplinary team and initiate plans and interventions as needed.   Outcome: Progressing  Pt up to bathroom and continent of urine. Skin care maintained

## 2014-03-28 NOTE — Discharge Instr - Appointments (Signed)
Follow-up appointment with Dr. Ree Edman is arranged for Monday, Mar 31, 2014 at 11:15 am.     Pls. Call the office if you need to reschedule or cancel the appointment at [phone]. Contact Person is Byrd Hesselbach.

## 2014-03-28 NOTE — Progress Notes (Signed)
Maureen Vasquez is a 80 y.o. female patient.  Active Problems:    Pneumonia    Past Medical History   Diagnosis Date   . H/O acute pancreatitis 1990's   . Back pain    . Seasonal allergic rhinitis    . NHL (non-Hodgkin's lymphoma) October 2013     w/ Splenic Nodules   . Lymphona, mantle cell, inguinal region/lower limb      Current Facility-Administered Medications   Medication Dose Route Frequency Provider Last Rate Last Dose   . acetaminophen (TYLENOL) tablet 650 mg  650 mg Oral Q4H PRN Lenox Ponds, MD   650 mg at 03/27/14 1257   . albuterol (PROVENTIL) nebulizer solution 2.5 mg  2.5 mg Nebulization Q6H PRN Marisa Sprinkles, MD       . benzonatate (TESSALON) capsule 100 mg  100 mg Oral TID PRN Darryll Capers R, MD       . dextrose 5 % and 0.9 % NaCl with KCl 20 mEq infusion   Intravenous Continuous Lenox Ponds, MD 125 mL/hr at 03/27/14 2151     . docusate sodium (COLACE) capsule 100 mg  100 mg Oral BID Darryll Capers R, MD   100 mg at 03/28/14 1010   . guaiFENesin (ROBITUSSIN) oral solution 200 mg  200 mg Oral Q4H PRN Allena Katz, Haleigh Desmith R, MD       . HYDROmorphone (DILAUDID) injection 0.5 mg  0.5 mg Intravenous Q2H PRN Allena Katz, Mina Carlisi R, MD       . magnesium hydroxide (MILK OF MAGNESIA) 400 MG/5ML suspension 30 mL  30 mL Oral QD PRN Darryll Capers R, MD       . metoprolol XL (TOPROL-XL) 24 hr tablet 25 mg  25 mg Oral Daily Darryll Capers R, MD   25 mg at 03/27/14 0931   . ondansetron (ZOFRAN) injection 4 mg  4 mg Intravenous QD PRN Allena Katz, Dylyn Mclaren R, MD       . piperacillin-tazobactam (ZOSYN) 4.5 g in sodium chloride 0.9 % 100 mL IVPB  4.5 g Intravenous Q8H Ramilo, Paul B, MD   4.5 g at 03/28/14 0518   . polyethylene glycol (MIRALAX) packet 17 g  17 g Oral QD PRN Darryll Capers R, MD       . vancomycin (VANCOCIN) 750 mg in sodium chloride 0.9 % 150 mL IVPB  750 mg Intravenous Q24H Ramilo, Paul B, MD 200 mL/hr at 03/27/14 1351 750 mg at 03/27/14 1351   . zolpidem (AMBIEN) tablet 5 mg  5 mg Oral QHS PRN Lenox Ponds, MD          Allergies   Allergen Reactions   . Aspirin Other (See Comments)     Oral and stomach irritation if taken daily   . Codeine Nausea Only   . Salicylates      Blood pressure 102/45, pulse 91, temperature 97.7 F (36.5 C), temperature source Oral, resp. rate 19, height 1.626 m (5\' 4" ), weight 50.939 kg (112 lb 4.8 oz), SpO2 94 %.    Subjective:  Symptoms:  She reports shortness of breath, malaise, cough and weakness.  No chest pain or diarrhea.    Diet:  Poor intake.  No nausea or vomiting.    Activity level: Impaired due to weakness.    Pain:  She complains of pain that is mild.  She reports pain is improving.  Pain is well controlled.    Review of Systems   Constitutional: Positive for weight loss and malaise/fatigue.  Negative for fever and chills.   HENT: Negative for congestion, ear discharge and tinnitus.    Eyes: Negative for blurred vision, double vision, photophobia and pain.   Respiratory: Positive for cough, sputum production and shortness of breath. Negative for wheezing.    Cardiovascular: Negative for chest pain, palpitations, orthopnea and leg swelling.   Gastrointestinal: Negative for nausea, vomiting, diarrhea and constipation.   Genitourinary: Negative for urgency, frequency and hematuria.   Musculoskeletal: Negative for back pain and neck pain.   Skin: Negative for itching and rash.   Neurological: Positive for dizziness and weakness. Negative for tingling, seizures and loss of consciousness.   Psychiatric/Behavioral: Negative for depression, suicidal ideas and memory loss. The patient does not have insomnia.          Objective:  General Appearance:  Comfortable, ill-appearing, in no acute distress and not in pain.    Vital signs: (most recent): Blood pressure 102/45, pulse 91, temperature 97.7 F (36.5 C), temperature source Oral, resp. rate 19, height 1.626 m (5\' 4" ), weight 50.939 kg (112 lb 4.8 oz), SpO2 94 %.  Vital signs are normal.  No fever.    Output: Producing urine and producing  stool.    HEENT: Normal HEENT exam.    Lungs:  Increased effort.  Breath sounds clear to auscultation.  There are decreased breath sounds.  No wheezes or rhonchi.    Heart: Normal rate.  S1 normal.  No murmur, gallop or friction rub.   Extremities: Normal range of motion.  There is no deformity, effusion, local swelling or dependent edema.    Neurological: Patient is alert.  Patient has normal reflexes, normal muscle tone and normal coordination.    Skin:  Warm, dry and pale.  No rash, ecchymosis or cyanosis.   Abdomen: Abdomen is soft.  Bowel sounds are normal.   There is no abdominal tenderness tenderness.   There is no mass. There is splenomegaly. There is no hepatomegaly.   Pupils:  Pupils are equal, round, and reactive to light.    Pulses: Distal pulses are intact.    MDM  Number of Diagnoses or Management Options  Pneumonia: established, improving     Amount and/or Complexity of Data Reviewed  Clinical lab tests: reviewed  Tests in the radiology section of CPT: reviewed  Tests in the medicine section of CPT: reviewed  Discussion of test results with the performing providers: yes  Decide to obtain previous medical records or to obtain history from someone other than the patient: yes  Obtain history from someone other than the patient: yes  Review and summarize past medical records: yes  Discuss the patient with other providers: yes  Independent visualization of images, tracings, or specimens: yes    Risk of Complications, Morbidity, and/or Mortality  Presenting problems: high  Diagnostic procedures: high  Management options: high    Critical Care  Total time providing critical care: 30-74 minutes    Patient Progress  Patient progress: improved        Assessment:  (S/P R -CHOP for NHL  Pancytopenia related to chemo s/p neulesta last week after chemo  Left pneumonia  ppresyncope  dehydration.  Anorexia  ).     Plan:   Consults: pulmonology (ID,hem).  Regular diet.  Give fluids.    (IVF  Zosyn/vanc  Nutrition  Watch counts  D/W husband  Called consults  Mar 27, 2014 chest x-ray shows the pneumonia being a little bit worse, however.  Appetite is coming up.  Will put her on Remeron.  Nutrition has seen her already watch the counts  Mar 28, 2014 patient feels better white cell count is 1.72.  Discussed with Dr. Salena Saner yesterday.  Discussed with Dr. Tamera Punt and Dr. Leodis Sias today.  Fertile home on 10 days of Augmentin  bacillus, Tessalon Perles, and Remeron.  Follow-up with Dr. Nadene Rubins Blease Capaldi-Donnelly and Dr. Ree Edman).       Lavaris Sexson R Allena Katz  03/28/2014

## 2014-03-28 NOTE — Consults (Cosign Needed)
HOME HEALTH    [facility]  [phone] will be resumed for nursing and PT. Agency was following pt prior to admission.

## 2014-03-29 NOTE — Progress Notes (Signed)
Late Note:   Case Management Initial Discharge Planning Assessment    PLAN OF CARE:   Pt is admitted for Neutropenic Fever having completed 5 rounds of Chemo with 1 round remaining. Pt is a very pleasant, up beat lady with a positive but realistic attitude toward her DX. Pt states her family is very supportive. Pt has a spouse & 2 sons with one son living locally with his family. HH Services were w/ [facility] PTA & Pt would like to continue with the same. Pt would like to continue with the same PT if possible.      Psychosocial/Demographic Information   Name of interviewee: Pt   Healthcare Decision Maker (HDM) (if other than the patient) Self, Maureen Vasquez if she is unable to make decisions   HDM - Relationship to Patient Spouse   HDM - Contact Information [phone]   Pt lives with Spouse   Type of residence where patient lives [facility]   Prior level of functioning (ambulation & ADLs) Independent   Correct Insurance listed on face sheet - verified with the patient/HDM MCR A/B; FEP BCBS      Any additional emergency contacts? Extended Emergency Contact Information  Primary Emergency Contact: Fennema,Allen  Address: [address]           [place], [place] [address] [place]  Home Phone: [phone]  Mobile Phone: [phone]  Relation: Spouse   Does the patient have an Advance Directive? Not Received  Advance Directive: Patient has advance directive, copy not in chart]     Is the POA/Guardianship documentation in shadow chart? (if applicable)  Healthcare Agent Appointed: No]  Healthcare Agent's Name: Maureen Vasquez   ]   Source of Income (SSDI. SSI. Social Security, pension, employment, Catering manager)      Economist in Place  Name of Primary Care Physician verified in patient banner (update in patient banner if not listed).  If no PCP, call [phone] with patient in room to get them connected with a PCP. Maureen Hook, MD  [phone]   What DME does the patient  currently own/have at home? (rolling walker, hospital bed, home O2, BiPAP/CPAP, bedside commode, cane, hoyer lift)  ]  Assistive Devices: Walker]   ]   Has the patient been to an Acute Rehab or SNF in the past?  If so, where? No   Does the patient currently have home health or hospice/palliative services in place?  If so, list agency name. Yes, [facility] with [facility]   Would the patient benefit from a PT/OT order? If so, is it ordered? Yes, Orders in place   Does the patient already have community dialysis set up?  If so, where? N/A     Readmission Assessment  Current LACE Score Reviewed? Yes/No 9   Is this patient an inpatient to inpatient 30 day readmission? Yes   Does the patient have difficulty obtaining his/her medications? No   Follow-up appt made with: [facility], [facility] or private pcp? TBD   Does the patient have difficulty getting to his/her appointment? No       Anticipated Discharge Plan  Discussed Anticipated Discharge Date and Discharge Disposition Possibilities with: _X__Patient   ___Healthcare Decision Maker  ___Other   Anticipated Disposition: Option A Home with HH   Anticipated Disposition: Option B    Who will transport the patient when ready for discharge? (offer wheelchair [place] service if patient/family cannot identify transport plan) Spouse  If applicable, were SNF or Hospice choices provided? N/A   Palliative Care Consult needed? (if yes, contact attending MD) [facility] Consult requested   Geriatrics Consult needed? (if yes, contact attending MD) N/A   [facility] Referral needed? (if yes, refer through [facility]) N/A   TCM Referral needed? (if yes, refer through [facility]) N/A   PACE Referral needed? (if yes, refer through [facility]) N/A   Are there any potential barriers to discharge identified?      ___Lack of Insurance  ___Lack of Health Literacy  ___Undocumented  ___No resources for meds or medical care  ___Transportation issues  ___Language/Cultural/Spiritual  ___Cognitive level /  capacity  ___Psychiatric or substance abuse issues  ___Co-morbidities  ___Potential abuse or neglect  ___Safety issues in the home  ___Potential placement issues  ___Pt / family disagreement with d/c plan  ___Lack of family support  ___Lack of extended family / friend support  ___Home Estate agent (multi-level home/access          issues)   _X NONE     Inpatient Medicare/Medicare HMO Patients Only  Was an initial IMM signed within 24 hours of admission?  (Look in Media Tab, Documents Table or Shadow Chart) Yes     Uninsured Patients Only  If patient has a spouse, does your spouse have insurance under his/her place of employment? N/A   Did the patient sign up for insurance through the Affordable Care Act? N/A       COMMENTS:

## 2014-03-29 NOTE — Discharge Summary (Signed)
Discharge Date: 03/28/2014     ATTENDING PHYSICIAN:  Darryll Capers, MD     PRINCIPAL DIAGNOSES:  Febrile neutropenia secondary to cycle 5 or 6 chemotherapy for  non-Hodgkin's mantel cell lymphoma, hypertension, severe anorexia,  malnutrition, actually pancytopenia secondary to chemotherapy.     HISTORY OF PRESENT ILLNESS:  Please see the H and P dated Mar 26, 2014.     HOSPITAL COURSE:  After patient was admitted, Dr. Salena Saner saw in consultation.  Her hemoglobin  is about 8.  Her  white cell count was 0.7, went up to 1.7 because she had  Neulasta in the office after chemotherapy.  Dr. Geralyn Corwin saw in consultation.   So did Dr. Tamera Punt.  Put on vancomycin and Zosyn.  She is much better.  The  shortness of breath is better, she feels better; however, she is weak and  she is not eating.  I discussed with Dr. Salena Saner and put her on Remeron.   So, now she will be discharged home with Augmentin 875 b.i.d. for 10 days.   I will put her on lactobacillus as well.  Tessalon Perles will be in order  and Remeron.  She will be followed with Dr. Ree Edman as well as Dr.  Nadene Rubins Shyquan Stallbaumer-Donnelly.           D:  03/28/2014 13:12 PM by Dr. Lenox Ponds, MD [phone])  T:  03/29/2014 10:32 AM by       Everlean Cherry: [id number]) (Doc ID: [id number])

## 2014-04-24 ENCOUNTER — Ambulatory Visit: Payer: Medicare Other | Admitting: Diagnostic Radiology

## 2014-04-24 ENCOUNTER — Encounter
Admission: RE | Disposition: A | Discharge status: Home / Self Care | Payer: Self-pay | Source: Ambulatory Visit | Attending: Diagnostic Radiology

## 2014-04-24 ENCOUNTER — Ambulatory Visit
Admission: RE | Admit: 2014-04-24 | Discharge: 2014-04-24 | Disposition: A | Discharge status: Home / Self Care | Payer: Medicare Other | Source: Ambulatory Visit | Attending: Diagnostic Radiology | Admitting: Diagnostic Radiology

## 2014-04-24 DIAGNOSIS — Z452 Encounter for adjustment and management of vascular access device: Secondary | ICD-10-CM | POA: Insufficient documentation

## 2014-04-24 DIAGNOSIS — C8589 Other specified types of non-Hodgkin lymphoma, extranodal and solid organ sites: Secondary | ICD-10-CM | POA: Insufficient documentation

## 2014-04-24 SURGERY — MEDIPORT REMOVAL
Anesthesia: Conscious Sedation

## 2014-04-24 MED ORDER — FENTANYL CITRATE 0.05 MG/ML IJ SOLN
INTRAMUSCULAR | Status: AC | PRN
Start: 2014-04-24 — End: 2014-04-24
  Administered 2014-04-24: 25 ug via INTRAVENOUS

## 2014-04-24 MED ORDER — MIDAZOLAM HCL 2 MG/2ML IJ SOLN
INTRAMUSCULAR | Status: AC
Start: 2014-04-24 — End: ?
  Filled 2014-04-24: qty 4

## 2014-04-24 MED ORDER — FENTANYL CITRATE 0.05 MG/ML IJ SOLN
INTRAMUSCULAR | Status: AC
Start: 2014-04-24 — End: ?
  Filled 2014-04-24: qty 4

## 2014-04-24 MED ORDER — ONDANSETRON HCL 4 MG/2ML IJ SOLN
INTRAMUSCULAR | Status: AC
Start: 2014-04-24 — End: ?
  Filled 2014-04-24: qty 4

## 2014-04-24 MED ORDER — LIDOCAINE 1% BUFFERED - CNR/OUTSOURCED
INTRAMUSCULAR | Status: AC
Start: 2014-04-24 — End: ?
  Filled 2014-04-24: qty 22

## 2014-04-24 MED ORDER — MIDAZOLAM HCL 2 MG/2ML IJ SOLN
INTRAMUSCULAR | Status: AC | PRN
Start: 2014-04-24 — End: 2014-04-24
  Administered 2014-04-24: 1 mg via INTRAVENOUS

## 2014-04-24 MED ORDER — MIDAZOLAM HCL 2 MG/2ML IJ SOLN
INTRAMUSCULAR | Status: AC | PRN
Start: 2014-04-24 — End: 2014-04-24
  Administered 2014-04-24: .5 mg via INTRAVENOUS

## 2014-04-24 MED ORDER — LIDOCAINE-EPINEPHRINE 1 %-1:100000 IJ SOLN
INTRAMUSCULAR | Status: AC
Start: 2014-04-24 — End: ?
  Filled 2014-04-24: qty 20

## 2014-04-24 MED ORDER — ONDANSETRON HCL 4 MG/2ML IJ SOLN
INTRAMUSCULAR | Status: AC | PRN
Start: 2014-04-24 — End: 2014-04-24
  Administered 2014-04-24: 8 mg via INTRAVENOUS

## 2014-04-24 NOTE — Brief Op Note (Signed)
PROCEDURE: Subcutaneous chest port removal.     History: End of treatment.     Technique: Informed written consent was obtained from the patient after  an explanation of the risks, benefits, and alternatives. The patient was  identified, brought to the procedure room and placed in the supine  position. Moderate sedation was provided with continuous cardiopulmonary  monitoring.       The right side of the neck and chest was prepped and draped in the usual  sterile fashion. The port pocket insertion site was anesthetized using  1% lidocaine and epinephrine. A #10 scalpel blade was used to incise the  dermis. Using a combination of blunt and sharp dissection techniques,  the port was freed from the surrounding tissues and was removed with the  infusion catheter. The pocket was flushed with sterile saline. The  pocket incision was closed in three layers. The fascia was closed with  interrupted 3-0 Vicryl stitches. The dermis was closed with a 5-0  Monocryl interrupted stitch. The closure was reinforced with Dermabond.       The patient tolerated the procedure well with no immediate  postprocedural complications.     IMPRESSION:    Impression:    Successful removal a right internal jugular vein chest port.       Tonna Boehringer, MD    04/24/2014 9:04 AM

## 2014-04-24 NOTE — Progress Notes (Addendum)
Pt received s/p Mediport removal. Pt awake and conversant. Mediport removal site intact. No drainage or oozing noted. No C/O's at this time. Tolerating clear liquid at this point. No c/o nausea.9.32 am Pt drowsy but easily awakened. No c/o's at the present time.9.43am pt remains drowsy but easily awakened. discharge instructions given to husband.9.56 pt discharged via wheel chair to main exit in stable condition.

## 2014-04-24 NOTE — H&P (Signed)
BRIEF IR H&P    Date Time: 04/24/2014 8:45 AM    PROCEDURALIST COMMENTS BELOW:         INDICATIONS:   Procedure(s):  Mediport Removal      PAST MEDICAL HISTORY:     Past Medical History   Diagnosis Date   . H/O acute pancreatitis 1990's   . Back pain    . Seasonal allergic rhinitis    . NHL (non-Hodgkin's lymphoma) October 2013     w/ Splenic Nodules   . Lymphona, mantle cell, inguinal region/lower limb        PAST SURGICAL HISTORY     Past Surgical History   Procedure Laterality Date   . Appendectomy       Age of 56   . Excision, squamous cell       Right hand/ 2000   . Placement, mediport           REVIEW OF SYSTEMS REVIEWED:   YES  (X  )      CURRENT MEDICATION REVIEWED   YES  (X  )        ALLERGIES:     Allergies   Allergen Reactions   . Aspirin Other (See Comments)     Oral and stomach irritation if taken daily   . Codeine Nausea Only   . Salicylates          PREVIOUS REACTION TO SEDATION MEDICATIONS     NO (X )   YES ( )      PHYSICAL EXAM     AIRWAY CLASSIFICATION:    CLASS I   (  )     CLASS II  (  )    CLASS III  (X  )     CLASS IV  (  )    INTUBATED (  )    CARDIAC :   (X  )  RRR  (  )  IRREG  (  )  MURMUR      LUNGS:   (X  )  CLEAR  (  )  DIMINISHED    (  ) LEFT   (  )  RIGHT  (  )  ABSENT          (  ) LEFT   (  )  RIGHT  (  )  TUBES            (  ) LEFT   (  )  RIGHT          ABDOMEN:   SOFT      NEURO:   AAOX3        LABS:     Lab Results   Component Value Date/Time    WBC 1.72* 03/28/2014  3:34 AM    WBC 6.86 06/01/2012  6:30 PM    HCT 22.8* 03/28/2014  3:34 AM    INR 0.9 02/27/2014  1:06 PM    PT 12.4* 02/27/2014  1:06 PM    BUN 3* 03/28/2014  3:34 AM    CREAT 0.6 03/28/2014  3:34 AM    GLU 105* 03/28/2014  3:34 AM    K 4.0 03/28/2014  3:34 AM             ASA PHYSICAL STATUS   (  )  ASA 1   HEALTHY PATIENT  (  )  ASA 2   MILD SYSTEMIC ILLNESS  (X  )  ASA 3   SYSTEMIC DISEASE, NOT  INCAPACITATING  (  )  ASA 4   SEVERE SYSTEMIC DISEASE, DISEASE IS CONSTANT THREAT TO                         LIFE  (  )  ASA 5    MORIBUND CONDITION, NOT EXPECTED TO LIVE >24 HOURS            IRRESPECTIVE OF PROCEDURE  (  )  E           EMERGENCY PROCEDURE       PLANNED SEDATION:   (  ) NO SEDATION  (X  ) MODERATE SEDATION  (  ) DEEP SEDATION WITH ANESTHESIA      CONCLUSION:   PATIENT HAS BEEN REASSESSED IMMEDIATELY PRIOR TO THE PROCEDURE   AND IS AN APPROPRIATE CANDIDATE FOR THE PLANNED SEDATION AND   PROCEDURE.  RISKS, BENEFITS AND ALTERNATIVES TO THE PLANNED   PROCEDURE AND SEDATION HAVE BEEN EXPLAINED TO THE PATIENT   OR GUARDIAN.    (X  )  YES  (  )  EMERGENCY CONSENT         Signed by Rex Kras, MD

## 2014-04-24 NOTE — Progress Notes (Signed)
7.30 am Pt to IR  [facility]. Consented self for Mediport removal. Pt comfortable and stable at this time.

## 2014-04-24 NOTE — Sedation Documentation (Addendum)
1610 - Pt brought into procedure room after self consenting for procedure  0822 - Pt being prepped and draped  0832 - Boarding Pass completed  [phone] - Procedure begun  0843 - Procedure finished  0849 - Pt out of room      Dr Jaynie Collins removed pt's right chest MediPort.  Steri strips in place and pt tolerated procedure well. VSS

## 2014-05-12 ENCOUNTER — Emergency Department (INDEPENDENT_AMBULATORY_CARE_PROVIDER_SITE_OTHER)
Admission: EM | Admit: 2014-05-12 | Discharge: 2014-05-12 | Disposition: A | Discharge status: Home / Self Care | Payer: Medicare Other | Source: Home / Self Care | Attending: Family Medicine | Admitting: Family Medicine

## 2014-05-12 ENCOUNTER — Encounter (HOSPITAL_COMMUNITY): Payer: Self-pay | Admitting: Emergency Medicine

## 2014-05-12 DIAGNOSIS — M2669 Other specified disorders of temporomandibular joint: Secondary | ICD-10-CM

## 2014-05-12 NOTE — Discharge Instructions (Signed)
Use heat, advil and ear drops 4 drops 3 times a day for 5 days., soft diet and see your dentist for further eval.

## 2014-05-12 NOTE — ED Provider Notes (Signed)
CSN: [id number]     Arrival date & time 05/12/14  1901 History   First MD Initiated Contact with Patient 05/12/14 1958     Chief Complaint  Patient presents with  . Otalgia   (Consider location/radiation/quality/duration/timing/severity/associated sxs/prior Treatment) Patient is a 80 y.o. female presenting with ear pain. The history is provided by the patient and a friend.  Otalgia Location:  Left Behind ear:  No abnormality Quality:  Aching and sharp Severity:  Mild Onset quality:  Gradual Duration:  4 days Progression:  Unchanged Chronicity:  New Ineffective treatments:  OTC medications Associated symptoms: no ear discharge, no fever and no hearing loss   Risk factors: no chronic ear infection     Past Medical History  Diagnosis Date  . Essential and other specified forms of tremor   . Esophageal reflux   . Insomnia, unspecified   . Allergic rhinitis, cause unspecified   . Acute bronchitis    Past Surgical History  Procedure Laterality Date  . Total abdominal hysterectomy w/ bilateral salpingoophorectomy    . Bladder tack    . Colon polyps     Family History  Problem Relation Age of Onset  . Allergies Brother   . Allergies Sister   . Cancer Father   . Other Mother     fracture hip age 78   History  Substance Use Topics  . Smoking status: Never Smoker   . Smokeless tobacco: Not on file  . Alcohol Use: No   OB History   Grav Para Term Preterm Abortions TAB SAB Ect Mult Living                 Review of Systems  Constitutional: Negative.  Negative for fever.  HENT: Positive for ear pain. Negative for ear discharge, hearing loss, mouth sores and postnasal drip.   Eyes: Negative.   Respiratory: Negative.     Allergies  Tramadol  Home Medications   Prior to Admission medications   Medication Sig Start Date End Date Taking? Authorizing Provider  acetaminophen (TYLENOL) 500 MG tablet Take 1,000 mg by mouth 2 (two) times daily as needed for moderate pain.     Historical Provider, MD  aspirin 325 MG tablet Take 325 mg by mouth daily as needed for moderate pain.     Historical Provider, MD  aspirin 81 MG tablet Take 81 mg by mouth daily.    Historical Provider, MD  busPIRone (BUSPAR) 30 MG tablet Take 30 mg by mouth 2 (two) times daily.    Historical Provider, MD  cephALEXin (KEFLEX) 500 MG capsule Take 1 capsule (500 mg total) by mouth 2 (two) times daily. 01/07/14   Shon Batonourtney F Horton, MD  clarithromycin (BIAXIN) 500 MG tablet Take 1 tablet by mouth Twice daily. 02/13/12   Historical Provider, MD  diazepam (VALIUM) 5 MG tablet Take 5 mg by mouth every 6 (six) hours as needed for muscle spasms.    Historical Provider, MD  esomeprazole (NEXIUM) 40 MG capsule Take 40 mg by mouth daily before breakfast.    Historical Provider, MD  estradiol (ESTRACE) 0.1 MG/GM vaginal cream Place 1 Applicatorful vaginally 2 (two) times a week.    Historical Provider, MD  fluticasone (FLONASE) 50 MCG/ACT nasal spray Place 2 sprays into the nose 2 (two) times daily. 08/19/13   Linna HoffJames D Kindl, MD  levothyroxine (SYNTHROID, LEVOTHROID) 25 MCG tablet Take 25-37.5 tablets by mouth See admin instructions. Takes 25mcg every day of week, except 37.605mcg on wednesdays 02/17/12  Historical Provider, MD  lidocaine (LIDODERM) 5 % Place 1 patch onto the skin daily. Remove & Discard patch within 12 hours or as directed by MD    Historical Provider, MD  Misc Natural Products (CALCIUM PYRUVATE) 750 MG CAPS Take 1 capsule by mouth daily.    Historical Provider, MD  omeprazole (PRILOSEC) 20 MG capsule Take 1 capsule (20 mg total) by mouth daily. 01/07/14   Shon Batonourtney F Horton, MD  predniSONE (DELTASONE) 20 MG tablet Take 3 tablets by mouth daily. 02/13/12   Historical Provider, MD  PROAIR HFA 108 (90 BASE) MCG/ACT inhaler Inhale 2 puffs into the lungs 4 (four) times daily as needed. 02/13/12   Historical Provider, MD  trimethoprim (TRIMPEX) 100 MG tablet Take 100 mg by mouth at bedtime.    Historical  Provider, MD  Vitamin D, Ergocalciferol, (DRISDOL) 50000 UNITS CAPS Take 1 capsule by mouth every Monday.  02/06/12   Historical Provider, MD   BP 128/71  Pulse 62  Temp(Src) 98.6 F (37 C) (Oral)  Resp 14  SpO2 100% Physical Exam  Nursing note and vitals reviewed. Constitutional: She is oriented to person, place, and time. She appears well-developed and well-nourished. No distress.  HENT:  Right Ear: External ear normal.  Left Ear: External ear normal.  Nose: Nose normal.  Mouth/Throat: Oropharynx is clear and moist.  Tender left tmj area to palp.  Eyes: Pupils are equal, round, and reactive to light.  Neck: Normal range of motion. Neck supple.  Lymphadenopathy:    She has no cervical adenopathy.  Neurological: She is alert and oriented to person, place, and time.  Skin: Skin is warm and dry.    ED Course  Procedures (including critical care time) Labs Review Labs Reviewed - No data to display  Imaging Review No results found.   MDM   1. TMJ capsulitis    Cortisporin drops instilled in left canal.    Linna HoffJames D Kindl, MD 05/12/14 2032

## 2014-05-12 NOTE — ED Notes (Signed)
Patient c/o left ear ache. Patient reports she has used OTC ear drops and a heating pad with no relief. Patient denies fever. Patient is alert and oriented and in no acute distress.

## 2014-06-21 ENCOUNTER — Encounter (HOSPITAL_COMMUNITY): Payer: Self-pay | Admitting: Emergency Medicine

## 2014-06-21 ENCOUNTER — Emergency Department (INDEPENDENT_AMBULATORY_CARE_PROVIDER_SITE_OTHER)
Admission: EM | Admit: 2014-06-21 | Discharge: 2014-06-21 | Disposition: A | Discharge status: Home / Self Care | Payer: Medicare Other | Source: Home / Self Care | Attending: Family Medicine | Admitting: Family Medicine

## 2014-06-21 DIAGNOSIS — N390 Urinary tract infection, site not specified: Secondary | ICD-10-CM

## 2014-06-21 LAB — POCT URINALYSIS DIP (DEVICE)
Bilirubin Urine: NEGATIVE
Glucose, UA: NEGATIVE mg/dL
Hgb urine dipstick: NEGATIVE
Ketones, ur: NEGATIVE mg/dL
Leukocytes, UA: NEGATIVE
Nitrite: NEGATIVE
PH: 6 (ref 5.0–8.0)
PROTEIN: NEGATIVE mg/dL
Urobilinogen, UA: 0.2 mg/dL (ref 0.0–1.0)

## 2014-06-21 MED ORDER — CEPHALEXIN 500 MG PO CAPS: 500.0000 mg | ORAL_CAPSULE | Freq: Three times a day (TID) | ORAL | Status: DC

## 2014-06-21 NOTE — ED Provider Notes (Signed)
Alric SetonFrances W Utter is a 80 y.o. female who presents to Urgent Care today for UTI. Patient has a three-day history of urinary frequency urgency and dysuria associated with mild pelvic pain. Symptoms consistent with previous episodes of UTI. She took some leftover Bactrim which helps some. The symptoms are moderate to severe and are interfering with sleep. She denies any fevers or chills nausea vomiting or diarrhea.   Past Medical History  Diagnosis Date  . Essential and other specified forms of tremor   . Esophageal reflux   . Insomnia, unspecified   . Allergic rhinitis, cause unspecified   . Acute bronchitis    History  Substance Use Topics  . Smoking status: Never Smoker   . Smokeless tobacco: Not on file  . Alcohol Use: No   ROS as above Medications: No current facility-administered medications for this encounter.   Current Outpatient Prescriptions  Medication Sig Dispense Refill  . busPIRone (BUSPAR) 30 MG tablet Take 30 mg by mouth 2 (two) times daily.      Marland Kitchen. levothyroxine (SYNTHROID, LEVOTHROID) 25 MCG tablet Take 25-37.5 tablets by mouth See admin instructions. Takes 25mcg every day of week, except 37.545mcg on wednesdays      . omeprazole (PRILOSEC) 20 MG capsule Take 1 capsule (20 mg total) by mouth daily.  15 capsule  0  . PROAIR HFA 108 (90 BASE) MCG/ACT inhaler Inhale 2 puffs into the lungs 4 (four) times daily as needed.      . trimethoprim (TRIMPEX) 100 MG tablet Take 100 mg by mouth at bedtime.      . Vitamin D, Ergocalciferol, (DRISDOL) 50000 UNITS CAPS Take 1 capsule by mouth every Monday.       Marland Kitchen. acetaminophen (TYLENOL) 500 MG tablet Take 1,000 mg by mouth 2 (two) times daily as needed for moderate pain.      Marland Kitchen. aspirin 325 MG tablet Take 325 mg by mouth daily as needed for moderate pain.       Marland Kitchen. aspirin 81 MG tablet Take 81 mg by mouth daily.      . cephALEXin (KEFLEX) 500 MG capsule Take 1 capsule (500 mg total) by mouth 3 (three) times daily.  21 capsule  0  .  diazepam (VALIUM) 5 MG tablet Take 5 mg by mouth every 6 (six) hours as needed for muscle spasms.      Marland Kitchen. esomeprazole (NEXIUM) 40 MG capsule Take 40 mg by mouth daily before breakfast.      . estradiol (ESTRACE) 0.1 MG/GM vaginal cream Place 1 Applicatorful vaginally 2 (two) times a week.      . fluticasone (FLONASE) 50 MCG/ACT nasal spray Place 2 sprays into the nose 2 (two) times daily.  1 g  2  . lidocaine (LIDODERM) 5 % Place 1 patch onto the skin daily. Remove & Discard patch within 12 hours or as directed by MD      . Misc Natural Products (CALCIUM PYRUVATE) 750 MG CAPS Take 1 capsule by mouth daily.      . predniSONE (DELTASONE) 20 MG tablet Take 3 tablets by mouth daily.        Exam:  BP 126/55  Pulse 75  Temp(Src) 98 F (36.7 C) (Oral)  Resp 20  Ht 5' 5.5" (1.664 m)  Wt 145 lb (65.772 kg)  BMI 23.75 kg/m2  SpO2 99% Gen: Well NAD HEENT: EOMI,  MMM Lungs: Normal work of breathing. CTABL Heart: RRR no MRG Abd: NABS, Soft. Nondistended, minimally tender pelvis with no rebound  or guarding. No CV angle tenderness to percussion Exts: Brisk capillary refill, warm and well perfused.   Results for orders placed during the hospital encounter of 06/21/14 (from the past 24 hour(s))  POCT URINALYSIS DIP (DEVICE)     Status: None   Collection Time    06/21/14 11:44 AM      Result Value Ref Range   Glucose, UA NEGATIVE  NEGATIVE mg/dL   Bilirubin Urine NEGATIVE  NEGATIVE   Ketones, ur NEGATIVE  NEGATIVE mg/dL   Specific Gravity, Urine <=1.005  1.005 - 1.030   Hgb urine dipstick NEGATIVE  NEGATIVE   pH 6.0  5.0 - 8.0   Protein, ur NEGATIVE  NEGATIVE mg/dL   Urobilinogen, UA 0.2  0.0 - 1.0 mg/dL   Nitrite NEGATIVE  NEGATIVE   Leukocytes, UA NEGATIVE  NEGATIVE   No results found.  Assessment and Plan: 80 y.o. female with probable partially treated urinary tract infection. Culture pending. Treatment with Keflex. Followup with primary care provider.  Discussed warning signs or  symptoms. Please see discharge instructions. Patient expresses understanding.   This note was created using Conservation officer, historic buildings. Any transcription errors are unintended.    Rodolph Bong, MD 06/21/14 1210

## 2014-06-21 NOTE — Discharge Instructions (Signed)
Thank you for coming in today. Take keflex three times daily for 1 week.  Come back as needed.  Follow up with your doctor.  If your belly pain worsens, or you have high fever, bad vomiting, blood in your stool or black tarry stool go to the Emergency Room.   Urinary Tract Infection Urinary tract infections (UTIs) can develop anywhere along your urinary tract. Your urinary tract is your body's drainage system for removing wastes and extra water. Your urinary tract includes two kidneys, two ureters, a bladder, and a urethra. Your kidneys are a pair of bean-shaped organs. Each kidney is about the size of your fist. They are located below your ribs, one on each side of your spine. CAUSES Infections are caused by microbes, which are microscopic organisms, including fungi, viruses, and bacteria. These organisms are so small that they can only be seen through a microscope. Bacteria are the microbes that most commonly cause UTIs. SYMPTOMS  Symptoms of UTIs may vary by age and gender of the patient and by the location of the infection. Symptoms in young women typically include a frequent and intense urge to urinate and a painful, burning feeling in the bladder or urethra during urination. Older women and men are more likely to be tired, shaky, and weak and have muscle aches and abdominal pain. A fever may mean the infection is in your kidneys. Other symptoms of a kidney infection include pain in your back or sides below the ribs, nausea, and vomiting. DIAGNOSIS To diagnose a UTI, your caregiver will ask you about your symptoms. Your caregiver also will ask to provide a urine sample. The urine sample will be tested for bacteria and white blood cells. White blood cells are made by your body to help fight infection. TREATMENT  Typically, UTIs can be treated with medication. Because most UTIs are caused by a bacterial infection, they usually can be treated with the use of antibiotics. The choice of antibiotic and  length of treatment depend on your symptoms and the type of bacteria causing your infection. HOME CARE INSTRUCTIONS  If you were prescribed antibiotics, take them exactly as your caregiver instructs you. Finish the medication even if you feel better after you have only taken some of the medication.  Drink enough water and fluids to keep your urine clear or pale yellow.  Avoid caffeine, tea, and carbonated beverages. They tend to irritate your bladder.  Empty your bladder often. Avoid holding urine for long periods of time.  Empty your bladder before and after sexual intercourse.  After a bowel movement, women should cleanse from front to back. Use each tissue only once. SEEK MEDICAL CARE IF:   You have back pain.  You develop a fever.  Your symptoms do not begin to resolve within 3 days. SEEK IMMEDIATE MEDICAL CARE IF:   You have severe back pain or lower abdominal pain.  You develop chills.  You have nausea or vomiting.  You have continued burning or discomfort with urination. MAKE SURE YOU:   Understand these instructions.  Will watch your condition.  Will get help right away if you are not doing well or get worse. Document Released: 08/10/2005 Document Revised: 05/01/2012 Document Reviewed: 12/09/2011 [facility] Patient Information 2015 [place], [place]. This information is not intended to replace advice given to you by your health care provider. Make sure you discuss any questions you have with your health care provider.

## 2014-06-21 NOTE — ED Notes (Signed)
Pt c/o poss UTI x 3 days Sx include: freq urination, dysuria, abd pain Denies f/v/n/d Has increased water intake Alert w/no signs of acute distress.

## 2014-06-22 LAB — URINE CULTURE
Colony Count: 25000
SPECIAL REQUESTS: NORMAL

## 2014-06-28 ENCOUNTER — Emergency Department (INDEPENDENT_AMBULATORY_CARE_PROVIDER_SITE_OTHER)
Admission: EM | Admit: 2014-06-28 | Discharge: 2014-06-28 | Disposition: A | Discharge status: Home / Self Care | Payer: Medicare Other | Source: Home / Self Care | Attending: Family Medicine | Admitting: Family Medicine

## 2014-06-28 ENCOUNTER — Encounter (HOSPITAL_COMMUNITY): Payer: Self-pay | Admitting: Emergency Medicine

## 2014-06-28 DIAGNOSIS — N390 Urinary tract infection, site not specified: Secondary | ICD-10-CM

## 2014-06-28 LAB — POCT URINALYSIS DIP (DEVICE)
Bilirubin Urine: NEGATIVE
GLUCOSE, UA: NEGATIVE mg/dL
Ketones, ur: NEGATIVE mg/dL
NITRITE: NEGATIVE
PH: 5.5 (ref 5.0–8.0)
Protein, ur: NEGATIVE mg/dL
Specific Gravity, Urine: <=1.005 (ref 1.005–1.030)
Urobilinogen, UA: 0.2 mg/dL (ref 0.0–1.0)

## 2014-06-28 MED ORDER — CIPROFLOXACIN HCL 250 MG PO TABS: 250.0000 mg | ORAL_TABLET | Freq: Two times a day (BID) | ORAL | Status: DC

## 2014-06-28 NOTE — ED Provider Notes (Signed)
CSN: [id number]     Arrival date & time 06/28/14  1122 History   First MD Initiated Contact with Patient 06/28/14 1153     Chief Complaint  Patient presents with  . Urinary Tract Infection   (Consider location/radiation/quality/duration/timing/severity/associated sxs/prior Treatment) HPI Comments: 80 year old female presents complaining of a persistent UTI. She was seen here on August 8 and diagnosed with UTI, treated with Keflex 500 mg 3 times a day for 7 days. She felt better while she was taking the antibiotic, but 2 days after she finished it she had recurrence of her symptoms. She has lower abdominal pain and pressure, and burning with urination. She denies flank pain, fever, chills, NVD. She denies any vaginal discharge. She took all her antibiotics as prescribed.  Patient is a 80 y.o. female presenting with urinary tract infection.  Urinary Tract Infection Associated symptoms include abdominal pain.    Past Medical History  Diagnosis Date  . Essential and other specified forms of tremor   . Esophageal reflux   . Insomnia, unspecified   . Allergic rhinitis, cause unspecified   . Acute bronchitis    Past Surgical History  Procedure Laterality Date  . Total abdominal hysterectomy w/ bilateral salpingoophorectomy    . Bladder tack    . Colon polyps     Family History  Problem Relation Age of Onset  . Allergies Brother   . Allergies Sister   . Cancer Father   . Other Mother     fracture hip age 71   History  Substance Use Topics  . Smoking status: Never Smoker   . Smokeless tobacco: Not on file  . Alcohol Use: No   OB History   Grav Para Term Preterm Abortions TAB SAB Ect Mult Living                 Review of Systems  Gastrointestinal: Positive for abdominal pain. Negative for nausea, vomiting and diarrhea.  Genitourinary: Positive for dysuria and urgency. Negative for frequency, hematuria, flank pain, decreased urine volume, vaginal discharge, difficulty urinating  and pelvic pain.  All other systems reviewed and are negative.   Allergies  Tramadol  Home Medications   Prior to Admission medications   Medication Sig Start Date End Date Taking? Authorizing Provider  busPIRone (BUSPAR) 30 MG tablet Take 30 mg by mouth 2 (two) times daily.   Yes Historical Provider, MD  cephALEXin (KEFLEX) 500 MG capsule Take 1 capsule (500 mg total) by mouth 3 (three) times daily. 06/21/14  Yes Rodolph Bong, MD  diazepam (VALIUM) 5 MG tablet Take 5 mg by mouth every 6 (six) hours as needed for muscle spasms.   Yes Historical Provider, MD  levothyroxine (SYNTHROID, LEVOTHROID) 25 MCG tablet Take 25-37.5 tablets by mouth See admin instructions. Takes every day of week, except 37.9mcg on wednesdays 02/17/12  Yes Historical Provider, MD  acetaminophen (TYLENOL) 500 MG tablet Take 1,000 mg by mouth 2 (two) times daily as needed for moderate pain.    Historical Provider, MD  aspirin 325 MG tablet Take 325 mg by mouth daily as needed for moderate pain.     Historical Provider, MD  aspirin 81 MG tablet Take 81 mg by mouth daily.    Historical Provider, MD  ciprofloxacin (CIPRO) 250 MG tablet Take 1 tablet (250 mg total) by mouth every 12 (twelve) hours. 06/28/14   Graylon Good, PA-C  esomeprazole (NEXIUM) 40 MG capsule Take 40 mg by mouth daily before breakfast.  Historical Provider, MD  estradiol (ESTRACE) 0.1 MG/GM vaginal cream Place 1 Applicatorful vaginally 2 (two) times a week.    Historical Provider, MD  fluticasone (FLONASE) 50 MCG/ACT nasal spray Place 2 sprays into the nose 2 (two) times daily. 08/19/13   Linna HoffJames D Kindl, MD  lidocaine (LIDODERM) 5 % Place 1 patch onto the skin daily. Remove & Discard patch within 12 hours or as directed by MD    Historical Provider, MD  Misc Natural Products (CALCIUM PYRUVATE) 750 MG CAPS Take 1 capsule by mouth daily.    Historical Provider, MD  omeprazole (PRILOSEC) 20 MG capsule Take 1 capsule (20 mg total) by mouth daily. 01/07/14    Shon Batonourtney F Horton, MD  predniSONE (DELTASONE) 20 MG tablet Take 3 tablets by mouth daily. 02/13/12   Historical Provider, MD  PROAIR HFA 108 (90 BASE) MCG/ACT inhaler Inhale 2 puffs into the lungs 4 (four) times daily as needed. 02/13/12   Historical Provider, MD  trimethoprim (TRIMPEX) 100 MG tablet Take 100 mg by mouth at bedtime.    Historical Provider, MD  Vitamin D, Ergocalciferol, (DRISDOL) 50000 UNITS CAPS Take 1 capsule by mouth every Monday.  02/06/12   Historical Provider, MD   BP 103/80  Pulse 88  Temp(Src) 97.9 F (36.6 C) (Oral)  Resp 16  SpO2 100% Physical Exam  Nursing note and vitals reviewed. Constitutional: She is oriented to person, place, and time. Vital signs are normal. She appears well-developed and well-nourished. No distress.  HENT:  Head: Normocephalic and atraumatic.  Pulmonary/Chest: Effort normal. No respiratory distress.  Abdominal: Normal appearance and bowel sounds are normal. There is tenderness in the suprapubic area. There is no CVA tenderness.  Neurological: She is alert and oriented to person, place, and time. She has normal strength. Coordination normal.  Skin: Skin is warm and dry. No rash noted. She is not diaphoretic.  Psychiatric: She has a normal mood and affect. Judgment normal.    ED Course  Procedures (including critical care time) Labs Review Labs Reviewed  POCT URINALYSIS DIP (DEVICE) - Abnormal; Notable for the following:    Hgb urine dipstick TRACE (*)    Leukocytes, UA SMALL (*)    All other components within normal limits  URINE CULTURE    Imaging Review No results found.   MDM   1. UTI (lower urinary tract infection)    Culture was inconclusive, resent. Treat with Cipro twice a day for one week. Followup as needed    Meds ordered this encounter  Medications  . ciprofloxacin (CIPRO) 250 MG tablet    Sig: Take 1 tablet (250 mg total) by mouth every 12 (twelve) hours.    Dispense:  14 tablet    Refill:  0    Order  Specific Question:  Supervising Provider    Answer:  Bradd CanaryKINDL, JAMES D [[id number]]       Graylon GoodZachary H Lorri Fukuhara, PA-C 06/28/14 [phone]

## 2014-06-28 NOTE — Discharge Instructions (Signed)

## 2014-06-28 NOTE — ED Provider Notes (Signed)
Medical screening examination/treatment/procedure(s) were performed by resident physician or non-physician practitioner and as supervising physician I was immediately available for consultation/collaboration.   Venissa Nappi DOUGLAS MD.   Jaylynn Siefert D Lenise Jr, MD 06/28/14 1246 

## 2014-06-28 NOTE — ED Notes (Signed)
Pt c/o persistent UTI; seen here on 8/8; given Keflex 500mg  Finished antibiotics; felt better but sx returned C/o abd pain, dysuria, urinary freq/urgency Denies f/v/n/d Alert w/no signs of acute distress.

## 2014-09-22 ENCOUNTER — Encounter: Payer: Self-pay | Admitting: Neurology

## 2014-09-22 ENCOUNTER — Ambulatory Visit (INDEPENDENT_AMBULATORY_CARE_PROVIDER_SITE_OTHER): Payer: Medicare Other | Admitting: Neurology

## 2014-09-22 VITALS — BP 113/66 | HR 70 | Ht 65.0 in | Wt 139.6 lb

## 2014-09-22 DIAGNOSIS — G252 Other specified forms of tremor: Principal | ICD-10-CM

## 2014-09-22 DIAGNOSIS — R251 Tremor, unspecified: Secondary | ICD-10-CM

## 2014-09-22 DIAGNOSIS — R5381 Other malaise: Secondary | ICD-10-CM

## 2014-09-22 DIAGNOSIS — E538 Deficiency of other specified B group vitamins: Secondary | ICD-10-CM

## 2014-09-22 DIAGNOSIS — G250 Essential tremor: Secondary | ICD-10-CM

## 2014-09-22 DIAGNOSIS — R413 Other amnesia: Secondary | ICD-10-CM

## 2014-09-22 HISTORY — DX: Other amnesia: R41.3

## 2014-09-22 MED ORDER — ESCITALOPRAM OXALATE 5 MG PO TABS: ORAL_TABLET | ORAL | Status: DC

## 2014-09-22 NOTE — Patient Instructions (Signed)

## 2014-09-22 NOTE — Progress Notes (Signed)
Reason for visit: memory disturbance  Nicole SetonFrances W [name] is a 80 y.o. female  History of present illness:  Nicole [name] is a 80 year old right-handed white female with a history of difficulty with memory that dates back approximately one year. The patient has had short-term memory issues, difficulty with names, and difficulty repeating herself. The patient does operate a motor vehicle, but she is having problems with directions. She has a lot of anxiety issues, particularly while driving. 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 She indicates that she does not sleep well as she is up frequently to take care of her husband. She has a lot of fatigue issues. She has had a 25 pound weight loss over the last 18 months. She does report a history of low back pain and frequent urinary tract infections. She has a tremor that affects the head and jaw, and she takes diazepam if needed for this. The patient may have some obsessive compulsive tendencies as well. She is still managing her finances and medications, but she needs some help keeping up with appointments. She is sent to this office for an evaluation.  Past Medical History  Diagnosis Date  . Essential and other specified forms of tremor   . Esophageal reflux   . Insomnia, unspecified   . Allergic rhinitis, cause unspecified   . Acute bronchitis   . Memory deficit 09/22/2014    Past Surgical History  Procedure Laterality Date  . Total abdominal hysterectomy w/ bilateral salpingoophorectomy    . Bladder tack    . Colon polyps    . Cataract surgery Bilateral sept and october 2015    Family History  Problem Relation Age of Onset  . Allergies Brother   . Allergies Sister   . Cancer Father   . Tremor Father   . Other Mother     fracture hip age 286  . Cancer Brother   . Tremor Brother   . Cancer Brother   . Heart disease Brother   . Heart disease Brother     Social history:  reports that she has never smoked. She  does not have any smokeless tobacco history on file. She reports that she does not drink alcohol or use illicit drugs.  Medications:  Current Outpatient Prescriptions on File Prior to Visit  Medication Sig Dispense Refill  . acetaminophen (TYLENOL) 500 MG tablet Take 1,000 mg by mouth 2 (two) times daily as needed for moderate pain.    Marland Kitchen. aspirin 81 MG tablet Take 81 mg by mouth daily.    . busPIRone (BUSPAR) 30 MG tablet Take 30 mg by mouth 2 (two) times daily.    . diazepam (VALIUM) 5 MG tablet Take 5 mg by mouth every 6 (six) hours as needed for muscle spasms.    Marland Kitchen. estradiol (ESTRACE) 0.1 MG/GM vaginal cream Place 1 Applicatorful vaginally 2 (two) times a week.    . fluticasone (FLONASE) 50 MCG/ACT nasal spray Place 2 sprays into the nose 2 (two) times daily. 1 g 2  . levothyroxine (SYNTHROID, LEVOTHROID) 25 MCG tablet Take 25-37.5 tablets by mouth See admin instructions. Takes 25mcg every day of week, except 37.865mcg on wednesdays    . PROAIR HFA 108 (90 BASE) MCG/ACT inhaler Inhale 2 puffs into the lungs 4 (four) times daily as needed.    . trimethoprim (TRIMPEX) 100 MG tablet Take 100 mg by mouth at bedtime.    . Vitamin D, Ergocalciferol, (DRISDOL) 50000 UNITS CAPS Take  1 capsule by mouth every Monday.      No current facility-administered medications on file prior to visit.      Allergies  Allergen Reactions  . Tramadol Nausea And Vomiting    ROS:  Out of a complete 14 system review of symptoms, the patient complains only of the following symptoms, and all other reviewed systems are negative.  Weight loss Ringing in the ears Constipation Urination problems Increased thirst Runny nose Memory loss, confusion, tremor Anxiety, not enough sleep, change in appetite Insomnia  Blood pressure 113/66, pulse 70, height 5\' 5"  (1.651 m), weight 139 lb 9.6 oz (63.322 kg).  Physical Exam  General: The patient is alert and cooperative at the time of the examination.  Eyes: Pupils  are equal, round, and reactive to light. Discs are flat bilaterally.  Neck: The neck is supple, no carotid bruits are noted.  Respiratory: The respiratory examination is clear.  Cardiovascular: The cardiovascular examination reveals a regular rate and rhythm, no obvious murmurs or rubs are noted.  Skin: Extremities are without significant edema.  Neurologic Exam  Mental status: The Mini-Mental status examination done today shows a total score 21/30.  Cranial nerves: Facial symmetry is present. There is good sensation of the face to pinprick and soft touch bilaterally. The strength of the facial muscles and the muscles to head turning and shoulder shrug are normal bilaterally. Speech is well enunciated, no aphasia or dysarthria is noted. Extraocular movements are full. Visual fields are full. The tongue is midline, and the patient has symmetric elevation of the soft palate. No obvious hearing deficits are noted.  Motor: The motor testing reveals 5 over 5 strength of all 4 extremities. Good symmetric motor tone is noted throughout.  Sensory: Sensory testing is intact to pinprick, soft touch, vibration sensation, and position sense on all 4 extremities. No evidence of extinction is noted.  Coordination: Cerebellar testing reveals good finger-nose-finger and heel-to-shin bilaterally.  Gait and station: Gait is normal. Tandem gait is slightly unsteady. Romberg is negative. No drift is seen.  Reflexes: Deep tendon reflexes are symmetric and normal bilaterally. Toes are downgoing bilaterally.   Assessment/Plan:  1. Memory disturbance  2. Tremor  3. Anxiety disorder  The patient has evolved a mild memory disorder. She will be set up for MRI evaluation of the brain, and some blood work will be done today. I have placed her on Lexapro for the anxiety disorder. She will go on 5 mg daily for 3 weeks, then to 10 mg daily. The dose may need to be increased further prior to her next revisit in  3-4 months. The patient may be a candidate for the use of Namenda added to the Aricept in the future.  Marlan Palau. Keith Willis MD 09/22/2014 8:39 PM  [facility] [address] [place], [place] [id number]  Phone [phone] Fax [phone]

## 2014-09-24 LAB — TSH: TSH: 1.86 u[IU]/mL (ref 0.450–4.500)

## 2014-09-24 LAB — COPPER, SERUM: Copper: 97 ug/dL (ref 72–166)

## 2014-09-24 LAB — VITAMIN B12: Vitamin B-12: >1999 pg/mL — ABNORMAL HIGH (ref 211–946)

## 2014-09-24 NOTE — Progress Notes (Signed)
Quick Note:  Spoke to patient's daughter and relayed blood work results unremarkable, per Dr. Anne HahnWillis. ______

## 2014-09-29 ENCOUNTER — Telehealth: Payer: Self-pay | Admitting: Neurology

## 2014-09-29 NOTE — Telephone Encounter (Signed)
I called the patient, talk with the daughter. The patient has had an increase in anxiety on Lexapro, she stopped the medication. The daughter wonders whether going on BuSpar or Aricept initially increase the anxiety, we can try her off of the Aricept for about 3 weeks to see if this helps. If not, we will get her off of the BuSpar. The patient in general get some benefit from the diazepam, she can take this occasionally.

## 2014-10-01 ENCOUNTER — Other Ambulatory Visit: Payer: Self-pay | Admitting: Internal Medicine

## 2014-10-01 ENCOUNTER — Encounter: Payer: Self-pay | Admitting: Neurology

## 2014-10-01 ENCOUNTER — Other Ambulatory Visit: Payer: Self-pay

## 2014-10-01 DIAGNOSIS — Z1231 Encounter for screening mammogram for malignant neoplasm of breast: Secondary | ICD-10-CM

## 2014-10-07 ENCOUNTER — Encounter: Payer: Self-pay | Admitting: Neurology

## 2014-10-14 ENCOUNTER — Other Ambulatory Visit: Payer: Medicare Other

## 2014-10-27 ENCOUNTER — Telehealth: Payer: Self-pay | Admitting: Neurology

## 2014-10-27 ENCOUNTER — Ambulatory Visit
Admission: RE | Admit: 2014-10-27 | Discharge: 2014-10-27 | Disposition: A | Discharge status: Home / Self Care | Payer: Medicare Other | Source: Ambulatory Visit | Attending: Neurology | Admitting: Neurology

## 2014-10-27 DIAGNOSIS — G252 Other specified forms of tremor: Principal | ICD-10-CM

## 2014-10-27 DIAGNOSIS — R413 Other amnesia: Secondary | ICD-10-CM

## 2014-10-27 DIAGNOSIS — G250 Essential tremor: Secondary | ICD-10-CM

## 2014-10-27 DIAGNOSIS — R251 Tremor, unspecified: Secondary | ICD-10-CM

## 2014-10-27 NOTE — Telephone Encounter (Signed)
I called. The MRI the brain shows minimal white matter changes, some tiny microhemorrhages are seen. The patient is on Aricept, may have Namenda added to this regimen in the future.   MRI brain 10/27/2014:  IMPRESSION:  Abnormal MRI brain (without) demonstrating: 1. Mild scattered, round and ovoid, periventricular and subcortical foci of non-specific gliosis. These findings are non-specific and considerations include autoimmune, inflammatory, post-infectious, microvascular ischemic or migraine associated etiologies.  2. Small, punctate left frontal chronic cerebral microhemorrhage (series 8 image 50). This also could be related to chronic small vessel ischemic disease. 3. Mild diffuse and moderate mesial temporal atrophy.  4. No acute findings.

## 2014-12-04 ENCOUNTER — Telehealth: Payer: Self-pay | Admitting: Neurology

## 2014-12-04 MED ORDER — MIRTAZAPINE 30 MG PO TABS: 30.0000 mg | ORAL_TABLET | Freq: Every day | ORAL | Status: DC

## 2014-12-04 NOTE — Telephone Encounter (Signed)
I called the daughter. The patient is having OCD problems, anxiety, not sleeping, not eating. She is off of the Lexapro, as she felt it worse in the anxiety. We will try Remeron at night 30 mg to see if this helps her sleep, boost appetite, and improves the OCD and anxiety tendencies. She is already on diazepam taking 5 mg twice daily, it is not helping much with anxiety.

## 2014-12-04 NOTE — Telephone Encounter (Signed)
Patient's daughter Steward DroneBrenda @ [phone], stated patient is off all medication due to having reactions.  Patient extremely anxious and confused to the point where she can't function on her on.  Patient's not eating and also becoming depressed.  Please call and advise.

## 2014-12-23 ENCOUNTER — Ambulatory Visit: Payer: Medicare Other | Admitting: Nurse Practitioner

## 2014-12-23 ENCOUNTER — Ambulatory Visit (INDEPENDENT_AMBULATORY_CARE_PROVIDER_SITE_OTHER): Payer: Medicare Other | Admitting: Nurse Practitioner

## 2014-12-23 ENCOUNTER — Encounter: Payer: Self-pay | Admitting: Nurse Practitioner

## 2014-12-23 VITALS — BP 104/58 | HR 75 | Ht 65.0 in | Wt 136.0 lb

## 2014-12-23 DIAGNOSIS — F411 Generalized anxiety disorder: Secondary | ICD-10-CM

## 2014-12-23 DIAGNOSIS — F32A Depression, unspecified: Secondary | ICD-10-CM | POA: Insufficient documentation

## 2014-12-23 DIAGNOSIS — R413 Other amnesia: Secondary | ICD-10-CM

## 2014-12-23 DIAGNOSIS — F329 Major depressive disorder, single episode, unspecified: Secondary | ICD-10-CM | POA: Insufficient documentation

## 2014-12-23 MED ORDER — MIRTAZAPINE 15 MG PO TABS: ORAL_TABLET | ORAL | Status: DC

## 2014-12-23 MED ORDER — DONEPEZIL HCL 5 MG PO TABS: 5.0000 mg | ORAL_TABLET | Freq: Every day | ORAL | Status: DC

## 2014-12-23 NOTE — Patient Instructions (Signed)
Get back on Aricept 5mg  daily for 1 month then 2 tabs daily Continue Remeron 15mg  hs with Valium.  Exercise your brain with cross words, strategy games, etc If possible take a 2 hour break from care giving daily F/.U in 3 months may add Namenda at that time

## 2014-12-23 NOTE — Progress Notes (Signed)
I have read the note, and I agree with the clinical assessment and plan.  Samari Gorby KEITH   

## 2014-12-23 NOTE — Progress Notes (Signed)
[facility]  PATIENT: Nicole [name] DOB: [birth year]   REASON FOR VISIT: memory loss HISTORY FROM:patient, daughter   HISTORY OF PRESENT ILLNESS: Nicole [name] is a 81 year old right-handed white female with a history of difficulty with memory that dates back approximately one year. The patient has had short-term memory issues, difficulty with names, and difficulty repeating herself. The patient does operate a motor vehicle, but she is having problems with directions. She has a lot of anxiety issues, particularly while driving. 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 She indicates that she does not sleep well as she is up frequently to take care of her husband. She has a lot of fatigue issues. She has had a 25 pound weight loss over the last 18 months. She does report a history of low back pain and frequent urinary tract infections. She has a tremor that affects the head and jaw, and she takes diazepam if needed for this. The patient may have some obsessive compulsive tendencies as well. She is still managing her finances and medications, but she needs some help keeping up with appointments. She is sent to this office for an evaluation. UPDATE 2/9/16[name], 81 year old female returns for follow-up with her daughter. She was last seen in this office and evaluated as an inpatient by Dr. Anne Hahn 09/22/2014. She has a history of obsessive-compulsive tendencies but recently has had more problems with her memory. She was placed on some Aricept at that time that she has stopped that medication. In addition she was placed on Lexapro for anxiety but she has stopped that medication as well and Dr. Anne Hahn added Remeron for anxiety. Blood work to evaluate causes of her memory loss returned normal. MRI of the brain done in December shows minimal white matter changes, some tiny microhemorrhages are seen. 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 He requires assistance for dressing and bathing and feeding. I think she may be overwhelmed with caregiving. She claims he is demanding. She has agreed to have 2 hours of help in the home Monday through Friday. She has had increased confusion depression and problems with memory since her husband has come home. She returns for reevaluation  REVIEW OF SYSTEMS: Full 14 system review of systems performed and notable only for those listed, all others are neg:  Constitutional: Appetite change Cardiovascular: neg Ear/Nose/Throat: neg  Skin: neg Eyes: neg Respiratory: neg Gastroitestinal: Urinary frequency Hematology/Lymphatic: neg  Endocrine: neg Musculoskeletal:neg Allergy/Immunology: neg Neurological: Memory loss Psychiatric: Agitation, confusion, depression, anxiety Sleep : Frequent awakening   ALLERGIES: Allergies  Allergen Reactions  . Tramadol Nausea And Vomiting    HOME MEDICATIONS: Outpatient Prescriptions Prior to Visit  Medication Sig Dispense Refill  . acetaminophen (TYLENOL) 500 MG tablet Take 1,000 mg by mouth 2 (two) times daily as needed for moderate pain.    Marland Kitchen aspirin 81 MG tablet Take 81 mg by mouth daily.    . diazepam (VALIUM) 5 MG tablet Take 5 mg by mouth every 6 (six) hours as needed for muscle spasms.    Marland Kitchen estradiol (ESTRACE) 0.1 MG/GM vaginal cream Place 1 Applicatorful vaginally 2 (two) times a week.    . fluticasone (FLONASE) 50 MCG/ACT nasal spray Place 2 sprays into the nose 2 (two) times daily. 1 g 2  . levothyroxine (SYNTHROID, LEVOTHROID) 25 MCG tablet Take 25-37.5 tablets by mouth See admin instructions. Takes every day of  week, except 37.65mcg on wednesdays    . PROAIR HFA 108 (90 BASE) MCG/ACT inhaler Inhale 2 puffs into the lungs 4 (four) times daily as needed.    . trimethoprim (TRIMPEX) 100 MG tablet Take 100 mg by mouth at bedtime.    . Vitamin D, Ergocalciferol, (DRISDOL) 50000 UNITS CAPS Take 1 capsule by  mouth every Monday.     . busPIRone (BUSPAR) 30 MG tablet Take 30 mg by mouth 2 (two) times daily.    Marland Kitchen donepezil (ARICEPT) 10 MG tablet Take 10 mg by mouth daily.    . mirtazapine (REMERON) 30 MG tablet Take 1 tablet (30 mg total) by mouth at bedtime. 30 tablet 2   No facility-administered medications prior to visit.    PAST MEDICAL HISTORY: Past Medical History  Diagnosis Date  . Essential and other specified forms of tremor   . Esophageal reflux   . Insomnia, unspecified   . Allergic rhinitis, cause unspecified   . Acute bronchitis   . Memory deficit 09/22/2014    PAST SURGICAL HISTORY: Past Surgical History  Procedure Laterality Date  . Total abdominal hysterectomy w/ bilateral salpingoophorectomy    . Bladder tack    . Colon polyps    . Cataract surgery Bilateral sept and october 2015    FAMILY HISTORY: Family History  Problem Relation Age of Onset  . Allergies Brother   . Allergies Sister   . Cancer Father   . Tremor Father   . Other Mother     fracture hip age 2  . Cancer Brother   . Tremor Brother   . Cancer Brother   . Heart disease Brother   . Heart disease Brother     SOCIAL HISTORY: History   Social History  . Marital Status: Married    Spouse Name: N/A    Number of Children: 2  . Years of Education: N/A   Occupational History  . 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 Smoking status: Never Smoker   . Smokeless tobacco: Never Used  . Alcohol Use: No  . Drug Use: No  . Sexual Activity: Not on file   Other Topics Concern  . Not on file   Social History Narrative     PHYSICAL EXAM  Filed Vitals:   12/23/14 1417  BP: 104/58  Pulse: 75  Height:  (1.651 m)  Weight: 136 lb (61.689 kg)   Body mass index is 22.63 kg/(m^2).  Generalized: Well developed, in no acute distress  Head: normocephalic and atraumatic,. Oropharynx benign  Neck: Supple, no carotid bruits  Cardiac: Regular rate rhythm, no  murmur  Musculoskeletal: No deformity   Neurological examination   Mentation: Alert  MMSE 22/30 missing items in orientation 3 of 3 recall and unable to copy a figure. AFT 9. Follows all commands speech and language fluent.   Cranial nerve II-XII: Fundoscopic exam reveals sharp disc margins.Pupils were equal round reactive to light extraocular movements were full, visual field were full on confrontational test. Facial sensation and strength were normal. hearing was intact to finger rubbing bilaterally. Uvula tongue midline. head turning and shoulder shrug were normal and symmetric.Tongue protrusion into cheek strength was normal. Motor: normal bulk and tone, full strength in the BUE, BLE, fine finger movements normal, no pronator drift. No focal weakness Sensory: normal and symmetric to light touch, pinprick, and  Vibration, proprioception  Coordination: finger-nose-finger, heel-to-shin bilaterally, no dysmetria Reflexes: Brachioradialis 2/2, biceps 2/2, triceps 2/2,  patellar 2/2, Achilles 2/2, plantar responses were flexor bilaterally. Gait and Station: Rising up from seated position without assistance, normal stance,  moderate stride, good arm swing, smooth turning, able to perform tiptoe, and heel walking without difficulty. Tandem gait is unsteady. Romberg is negative  DIAGNOSTIC DATA (LABS, IMAGING, TESTING) - I reviewed patient records, labs, notes, testing and imaging myself where available.  Lab Results  Component Value Date   WBC 8.3 01/07/2014   HGB 14.1 01/07/2014   HCT 40.4 01/07/2014   MCV 95.7 01/07/2014   PLT 188 01/07/2014      Component Value Date/Time   NA 141 01/07/2014 1323   K 4.2 01/07/2014 1323   CL 98 01/07/2014 1323   CO2 28 01/07/2014 1323   GLUCOSE 122* 01/07/2014 1323   BUN 16 01/07/2014 1323   CREATININE 1.27* 01/07/2014 1323   CALCIUM 11.0* 01/07/2014 1323   PROT 7.3 01/07/2014 1323   ALBUMIN 3.9 01/07/2014 1323   AST 25 01/07/2014 1323   ALT 16  01/07/2014 1323   ALKPHOS 39 01/07/2014 1323   BILITOT 0.5 01/07/2014 1323   GFRNONAA 39* 01/07/2014 1323   GFRAA 46* 01/07/2014 1323    Lab Results  Component Value Date   VITAMINB12 >1999* 09/22/2014   Lab Results  Component Value Date   TSH 1.860 09/22/2014     ASSESSMENT AND PLAN  81 y.o. year old female  has a past medical history of memory disorder complicated by anxiety and depression. Patient was recently stopped her Aricept and her Lexapro. Remeron was added.   Get back on Aricept 5mg  daily for 1 month then 2 tabs daily Continue Remeron 15mg  hs with Valium.  Exercise your brain with cross words, strategy games, etc Given information on resources in the community to help she may need to think about placement short term for her husband since she is having much difficulty assuming this role If possible take a 2 hour break from care giving daily F/.U in 3 months may add Namenda at that time Total face to face time was 50 min.  Nilda RiggsNancy Carolyn Martin, [facility] LLCGNP, [facility], APRN  [facility] [address], [address] [place], [place] [address] [phone]

## 2014-12-31 ENCOUNTER — Telehealth: Payer: Self-pay | Admitting: *Deleted

## 2014-12-31 NOTE — Telephone Encounter (Signed)
Patient daughter calling wanting to know if she can talk to someone about her mothers Dementia. The daughter would like to know how should she handle. She is willing to come in and talk about it or have someone to call her. She is on the medical release form . Patient was just seen last week and daughter could not talk to CM without mother being there.

## 2015-01-01 NOTE — Telephone Encounter (Signed)
Telephone call to the daughter. 15 minute discussion regarding long term plans, memory units, skilled nursing facilities, increased help in the home, behaviors etc. recommended that she get 36 hour day and read it. She is appreciative of the phone call

## 2015-01-19 ENCOUNTER — Telehealth: Payer: Self-pay | Admitting: Nurse Practitioner

## 2015-01-19 NOTE — Telephone Encounter (Signed)
Debra please call the daughter and get payment and specifics for the letter. Thanks

## 2015-01-19 NOTE — Telephone Encounter (Signed)
Patient's daughter Steward DroneBrenda will stop by the office today to pay for the letter.

## 2015-01-19 NOTE — Telephone Encounter (Signed)
Patient's daughter, Bernie CoveyBrenda Thomason @ [phone], requesting a letter stating patient's incompetent to handle financial affairs.  Daughter requesting information in order to become patients POA.  Please call and advise.

## 2015-01-26 ENCOUNTER — Telehealth: Payer: Self-pay | Admitting: Neurology

## 2015-01-26 NOTE — Telephone Encounter (Signed)
I called, it is after hours, I will call back tomorrow.

## 2015-01-26 NOTE — Telephone Encounter (Signed)
GrenadaBrittany @ [phone], stated Dr. Marge DuncansSchooler's questioning if it's ok to restart Rx BUSPAR?  Patient has been on Valium, taking more than 2 tabs twice a day on some days.  Patient ran out of 3 month supply and lost supplement refill as well.  Dr. Bosie ClosSchooler questioning if this is a ok regimen with Rx's donepezil (ARICEPT) 5 MG tablet and mirtazapine (REMERON) 15 MG tablet.  Also questioning if BUSPAR will help with Tremors?  Please call and advise.

## 2015-01-27 NOTE — Telephone Encounter (Signed)
I called Nicole [name] and left a message. The patient is on low-dose Remeron, but should be okay to start BuSpar if a field this is likely to be helpful for the patient. They are to call me back if they have further questions.

## 2015-01-28 ENCOUNTER — Telehealth: Payer: Self-pay | Admitting: *Deleted

## 2015-01-28 NOTE — Telephone Encounter (Signed)
Patient daughter no longer needs letter for her mom.

## 2015-01-29 ENCOUNTER — Encounter: Payer: Self-pay | Admitting: Body Imaging

## 2015-02-20 ENCOUNTER — Telehealth: Payer: Self-pay | Admitting: Neurology

## 2015-02-20 NOTE — Telephone Encounter (Signed)
Pharmacy calling about Remeron Rx.   Per note from 01/21: We will try Remeron at night 30 mg to see if this helps her sleep, boost appetite, and improves the OCD and anxiety tendencies   Per last OV note 02/09: Continue Remeron 15mg  hs with Valium Pharmacy filled 15mg  today, but noticed 30mg  was also on her file.  If we would like to continue 30mg , they will need a new Rx sent, otherwise, they will continue with 15mg  Rx.  Please advise.  Thank you.

## 2015-02-20 NOTE — Telephone Encounter (Signed)
[facility] is calling requesting a refill for mirtazapine (REMERON) 15 MG tablet not sure if it should be for 15 mg or 30 mg please advise.

## 2015-02-20 NOTE — Telephone Encounter (Signed)
I initially placed her on the 30 mg Remeron tablets, somehow she got switched to the 15's, if she is doing well with this, we will continue the 15 mg dosing.

## 2015-03-03 ENCOUNTER — Telehealth: Payer: Self-pay

## 2015-03-03 NOTE — Telephone Encounter (Signed)
Called and spoke to patient's daughter Lawrence SantiagoBrenda Thompson PO- [phone]. Patient's apt had to be bumped until June because of bump list  and daughter needed a 2:30 or 3:00 apt. Daughter stated patient is taking her Aricept 5 mg 2 tablets daily and it's working some.. Daughter want's to know if Eber JonesCarolyn can go ahead and do her Namenda  RX and discuss Namenda at apt. In June ?

## 2015-03-03 NOTE — Telephone Encounter (Signed)
Namenda has to be titrated slowly. Left titration sample at the front desk. Follow the directions. Please call the daughter

## 2015-03-03 NOTE — Telephone Encounter (Signed)
Returned call. Left detailed message for POA per DPR.

## 2015-03-24 ENCOUNTER — Ambulatory Visit: Payer: Medicare Other | Admitting: Nurse Practitioner

## 2015-04-01 ENCOUNTER — Ambulatory Visit: Payer: Medicare Other | Admitting: Nurse Practitioner

## 2015-04-02 ENCOUNTER — Other Ambulatory Visit: Payer: Self-pay

## 2015-04-02 ENCOUNTER — Telehealth: Payer: Self-pay | Admitting: Nurse Practitioner

## 2015-04-02 MED ORDER — MEMANTINE HCL ER 28 MG PO CP24
28.0000 mg | ORAL_CAPSULE | Freq: Every day | ORAL | Status: DC
Start: 2015-04-02 — End: 2015-04-03

## 2015-04-02 MED ORDER — MEMANTINE HCL ER 28 MG PO CP24: 28.0000 mg | ORAL_CAPSULE | Freq: Every day | ORAL | Status: DC

## 2015-04-02 NOTE — Telephone Encounter (Signed)
Pt's daughter(Brenda) called stating pt received sample of Namenda. Pt needs a script [facility] [address]. Pt's daughter Steward Drone(Brenda) can be reached at [phone].

## 2015-04-02 NOTE — Telephone Encounter (Signed)
Pt's daughter Nicole [name] called and stated that the pt had taken all of the sample Rx. NAMENDA and has requested a prescription for the medication. Please call and advise.

## 2015-04-02 NOTE — Telephone Encounter (Signed)
The pt returned call and asked for the medication to be [facility] to [id number] [place] - [address]

## 2015-04-02 NOTE — Telephone Encounter (Signed)
Rx has been [facility] [address]  [facility] other [facility] and spoke with Revonda StandardAllison.  She cancelled the order sent.

## 2015-04-02 NOTE — Telephone Encounter (Signed)
Rx has been sent to [facility] [address] per message.  I called back to advise.  Got no answer.  Left message.

## 2015-04-03 ENCOUNTER — Other Ambulatory Visit: Payer: Self-pay

## 2015-04-03 MED ORDER — MEMANTINE HCL ER 28 MG PO CP24: 28.0000 mg | ORAL_CAPSULE | Freq: Every day | ORAL | Status: DC

## 2015-04-03 NOTE — Telephone Encounter (Signed)
Rx resent for 90 day supply per patient request

## 2015-04-03 NOTE — Telephone Encounter (Signed)
Patient's daughter is calling to get a 3 month supply called in for memantine (NAMENDA XR) 28 MG CP24 24 hr capsule because it is much cheaper and to have the other order cancelled for memantine (NAMENDA XR) 28 MG CP24 24 hr capsule #30. [facility] [address] Thank you.

## 2015-04-03 NOTE — Telephone Encounter (Signed)
Rx has been resent for 90 day supply per request.

## 2015-05-05 ENCOUNTER — Ambulatory Visit (INDEPENDENT_AMBULATORY_CARE_PROVIDER_SITE_OTHER): Payer: Medicare Other | Admitting: Nurse Practitioner

## 2015-05-05 ENCOUNTER — Encounter: Payer: Self-pay | Admitting: Nurse Practitioner

## 2015-05-05 VITALS — BP 107/59 | HR 72 | Ht 65.0 in | Wt 131.4 lb

## 2015-05-05 DIAGNOSIS — F32A Depression, unspecified: Secondary | ICD-10-CM

## 2015-05-05 DIAGNOSIS — F411 Generalized anxiety disorder: Secondary | ICD-10-CM | POA: Diagnosis not present

## 2015-05-05 DIAGNOSIS — R413 Other amnesia: Secondary | ICD-10-CM

## 2015-05-05 DIAGNOSIS — F329 Major depressive disorder, single episode, unspecified: Secondary | ICD-10-CM

## 2015-05-05 MED ORDER — DONEPEZIL HCL 10 MG PO TABS: 10.0000 mg | ORAL_TABLET | Freq: Every day | ORAL | Status: DC

## 2015-05-05 MED ORDER — MEMANTINE HCL ER 28 MG PO CP24: 28.0000 mg | ORAL_CAPSULE | Freq: Every day | ORAL | Status: DC

## 2015-05-05 NOTE — Progress Notes (Signed)
I have read the note, and I agree with the clinical assessment and plan.  Renata Gambino KEITH   

## 2015-05-05 NOTE — Patient Instructions (Addendum)
Change Aricept to 10 mg tablet will refill Continue Namenda 28 mg extended release will refill Continue Remeron 15 mg at bedtime Follow-up in 6 months next visit with Dr. Anne Hahn

## 2015-05-05 NOTE — Progress Notes (Signed)
[facility]  PATIENT: Nicole [name] DOB: [birth year]   REASON FOR VISIT: Follow-up for memory loss and anxiety disorder  HISTORY FROM: Patient and daughter    HISTORY OF PRESENT ILLNESS:Nicole [name] is a 81 year old right-handed white female with a history of difficulty with memory that dates back approximately one year. The patient has had short-term memory issues, difficulty with names, and difficulty repeating herself. The patient does operate a motor vehicle, but she is having problems with directions. She has a lot of anxiety issues, particularly while driving. 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 She indicates that she does not sleep well as she is up frequently to take care of her husband. She has a lot of fatigue issues. She has had a 25 pound weight loss over the last 18 months. She does report a history of low back pain and frequent urinary tract infections. She has a tremor that affects the head and jaw, and she takes diazepam if needed for this. The patient may have some obsessive compulsive tendencies as well. She is still managing her finances and medications, but she needs some help keeping up with appointments. She is sent to this office for an evaluation. UPDATE 2/9/16Ms. Nicole [name], 81 year old female returns for follow-up with her daughter. She was last seen in this office and evaluated as an inpatient by Dr. Anne Hahn 09/22/2014. She has a history of obsessive-compulsive tendencies but recently has had more problems with her memory. She was placed on some Aricept at that time that she has stopped that medication. In addition she was placed on Lexapro for anxiety but she has stopped that medication as well and Dr. Anne Hahn added Remeron for anxiety. Blood work to evaluate causes of her memory loss returned normal. MRI of the brain done in December shows minimal white matter changes, some tiny microhemorrhages are seen. She is the caregiver for  an elderly husband who has had 2 bouts of pneumonia in the past several months. He requires assistance for dressing and bathing and feeding. I think she may be overwhelmed with caregiving. She claims he is demanding. She has agreed to have 2 hours of help in the home Monday through Friday. She has had increased confusion depression and problems with memory since her husband has come home. She returns for reevaluation UPDATE 05/05/15 Nicole [name], 81 year old female returns for follow-up. She has a history of memory loss and anxiety disorder. She got back on her Aricept after her last visit in February and is now taking 10 mg daily,  in addition Namenda was added to her regimen in May. She is currently taking 28 mg extended release. She now has some help in the home to assist with the care of her husband who requires total care for ADLs. She is less anxious and confused according to the daughter. The home situation is much better. She remains on Remeron 15 mg daily. She is also on BuSpar by Dr. Bosie Clos. She returns for reevaluation   REVIEW OF SYSTEMS: Full 14 system review of systems performed and notable only for those listed, all others are neg:  Constitutional: Fatigue  Cardiovascular: neg Ear/Nose/Throat: neg  Skin: neg Eyes: neg Respiratory: neg Gastroitestinal: neg  Hematology/Lymphatic: neg  Endocrine: neg Musculoskeletal:neg Allergy/Immunology: neg Neurological: Memory loss Psychiatric: Anxiety Sleep : neg   ALLERGIES: Allergies  Allergen Reactions  . Tramadol Nausea And Vomiting    HOME MEDICATIONS: Outpatient Prescriptions Prior to Visit  Medication Sig Dispense Refill  .  acetaminophen (TYLENOL) 500 MG tablet Take 1,000 mg by mouth 2 (two) times daily as needed for moderate pain.    Marland Kitchen aspirin 81 MG tablet Take 81 mg by mouth daily.    . diazepam (VALIUM) 5 MG tablet Take 5 mg by mouth every 6 (six) hours as needed for muscle spasms.    Marland Kitchen donepezil (ARICEPT) 5 MG tablet Take  1 tablet (5 mg total) by mouth at bedtime. 1 tab daily for 1 month then 2 tabs daily 180 tablet 1  . estradiol (ESTRACE) 0.1 MG/GM vaginal cream Place 1 Applicatorful vaginally 2 (two) times a week.    . fluticasone (FLONASE) 50 MCG/ACT nasal spray Place 2 sprays into the nose 2 (two) times daily. 1 g 2  . levothyroxine (SYNTHROID, LEVOTHROID) 25 MCG tablet Take 25-37.5 tablets by mouth See admin instructions. Takes every day of week, except 37.5mcg on wednesdays    . memantine (NAMENDA XR) 28 MG CP24 24 hr capsule Take 1 capsule (28 mg total) by mouth daily. 90 capsule 0  . mirtazapine (REMERON) 15 MG tablet Full tab at hs 90 tablet 1  . PROAIR HFA 108 (90 BASE) MCG/ACT inhaler Inhale 2 puffs into the lungs 4 (four) times daily as needed.    . trimethoprim (TRIMPEX) 100 MG tablet Take 100 mg by mouth at bedtime.    . Vitamin D, Ergocalciferol, (DRISDOL) 50000 UNITS CAPS Take 1 capsule by mouth every Monday.      No facility-administered medications prior to visit.    PAST MEDICAL HISTORY: Past Medical History  Diagnosis Date  . Essential and other specified forms of tremor   . Esophageal reflux   . Insomnia, unspecified   . Allergic rhinitis, cause unspecified   . Acute bronchitis   . Memory deficit 09/22/2014    PAST SURGICAL HISTORY: Past Surgical History  Procedure Laterality Date  . Total abdominal hysterectomy w/ bilateral salpingoophorectomy    . Bladder tack    . Colon polyps    . Cataract surgery Bilateral sept and october 2015    FAMILY HISTORY: Family History  Problem Relation Age of Onset  . Allergies Brother   . Allergies Sister   . Cancer Father   . Tremor Father   . Other Mother     fracture hip age 13  . Cancer Brother   . Tremor Brother   . Cancer Brother   . Heart disease Brother   . Heart disease Brother     SOCIAL HISTORY: History   Social History  . Marital Status: Married    Spouse Name: N/A  . Number of Children: 2  . Years of  Education: N/A   Occupational History  . 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 Smoking status: Never Smoker   . Smokeless tobacco: Never Used  . Alcohol Use: No  . Drug Use: No  . Sexual Activity: Not on file   Other Topics Concern  . Not on file   Social History Narrative     PHYSICAL EXAM  Filed Vitals:   05/05/15 1423  Height: 5\' 5"  (1.651 m)  Weight: 131 lb 6.4 oz (59.603 kg)   Body mass index is 21.87 kg/(m^2). Generalized: Well developed, in no acute distress  Head: normocephalic and atraumatic,. Oropharynx benign  Neck: Supple, no carotid bruits  Cardiac: Regular rate rhythm, no murmur  Musculoskeletal: No deformity   Neurological examination   Mentation: Alert MMSE 21/30 missing items in  orientation 3 of 3 recall and unable to copy a figure. AFT 9. Follows all commands speech and language fluent.   Cranial nerve II-XII: Fundoscopic exam reveals sharp disc margins.Pupils were equal round reactive to light extraocular movements were full, visual field were full on confrontational test. Facial sensation and strength were normal. hearing was intact to finger rubbing bilaterally. Uvula tongue midline. head turning and shoulder shrug were normal and symmetric.Tongue protrusion into cheek strength was normal. Motor: normal bulk and tone, full strength in the BUE, BLE, fine finger movements normal, no pronator drift. No focal weakness Sensory: normal and symmetric to light touch, pinprick, and Vibration,   Coordination: finger-nose-finger, heel-to-shin bilaterally, no dysmetria Reflexes: Brachioradialis 2/2, biceps 2/2, triceps 2/2, patellar 2/2, Achilles 2/2, plantar responses were flexor bilaterally. Gait and Station: Rising up from seated position without assistance, normal stance, moderate stride, good arm swing, smooth turning, able to perform tiptoe, and heel walking without difficulty. Tandem gait is unsteady. Romberg is  negative   DIAGNOSTIC DATA (LABS, IMAGING, TESTING) -     ASSESSMENT AND PLAN  81 y.o. year old female  has a past medical history of Essential and other specified forms of tremor; and Memory deficit (09/22/2014). And depression here to follow-up.  Change Aricept to 10 mg tablet will refill Continue Namenda 28 mg extended release will refill Continue Remeron 15 mg at bedtime Follow-up in 6 months next visit with Dr. Woody Seller, [facility], [facility], APRN  [facility] [address], [address] [place], [place] [address] [phone]  ile()) cellulitis that created didn't but I'll call her back with

## 2015-10-07 ENCOUNTER — Telehealth: Payer: Self-pay | Admitting: Nurse Practitioner

## 2015-10-07 NOTE — Telephone Encounter (Signed)
Patient's daughter is calling to discuss the patient's medication mirtazapine (REMERON) 15 MG tablet. She needs to know if the patient should still be taking this medication or has the medication been stopped. Please call and advise. Thank you.

## 2015-10-07 NOTE — Telephone Encounter (Signed)
Last OV note says: Continue Remeron 15 mg at bedtime I called back. Relayed this info.  She expressed understanding.

## 2015-10-18 ENCOUNTER — Other Ambulatory Visit: Payer: Self-pay

## 2015-10-18 MED ORDER — MIRTAZAPINE 15 MG PO TABS: ORAL_TABLET | ORAL | Status: DC

## 2015-11-04 ENCOUNTER — Encounter: Payer: Self-pay | Admitting: Neurology

## 2015-11-04 ENCOUNTER — Ambulatory Visit (INDEPENDENT_AMBULATORY_CARE_PROVIDER_SITE_OTHER): Payer: Medicare Other | Admitting: Neurology

## 2015-11-04 VITALS — BP 108/59 | HR 81 | Ht 65.5 in | Wt 143.0 lb

## 2015-11-04 DIAGNOSIS — G250 Essential tremor: Secondary | ICD-10-CM | POA: Diagnosis not present

## 2015-11-04 DIAGNOSIS — R413 Other amnesia: Secondary | ICD-10-CM | POA: Diagnosis not present

## 2015-11-04 MED ORDER — TOPIRAMATE 25 MG PO TABS: 25.0000 mg | ORAL_TABLET | Freq: Every day | ORAL | Status: DC

## 2015-11-04 MED ORDER — MEMANTINE HCL-DONEPEZIL HCL ER 28-10 MG PO CP24: 1.0000 | ORAL_CAPSULE | Freq: Every day | ORAL | Status: DC

## 2015-11-04 NOTE — Progress Notes (Signed)
Reason for visit: Memory disturbance  Nicole [name] "Hortensia-Frances" Lacretia Nicks Hester is an 81 y.o. female  History of present illness:  Ms. Meloni is a 81 year old right-handed white female with a history of a progressive memory disturbance. The patient is currently living alone at home, her husband died on 11/09/2015. The patient is having some increased anxiety issues and depression. With this, her head and neck and jaw tremor have worsened. The patient is on Aricept and Namenda, she is tolerating these medications well. She has been placed on diazepam to treat her tremor and anxiety. The patient is also on mirtazapine. She still is operating a motor vehicle without difficulty. She does her finances, but her family does look in on her, and she has an Geophysicist/field seismologist that comes in daily to help her. She denies any weight loss since last seen. She indicates that she does not have tremors involving the arms. No other new medical issues that have come up since last seen.  Past Medical History  Diagnosis Date  . Essential and other specified forms of tremor   . Esophageal reflux   . Insomnia, unspecified   . Allergic rhinitis, cause unspecified   . Acute bronchitis   . Memory deficit 09/22/2014    Past Surgical History  Procedure Laterality Date  . Total abdominal hysterectomy w/ bilateral salpingoophorectomy    . Bladder tack    . Colon polyps    . Cataract surgery Bilateral sept and october 2015    Family History  Problem Relation Age of Onset  . Allergies Brother   . Allergies Sister   . Cancer Father   . Tremor Father   . Other Mother     fracture hip age 53  . Cancer Brother   . Tremor Brother   . Cancer Brother   . Heart disease Brother   . Heart disease Brother     Social history:  reports that she has never smoked. She has never used smokeless tobacco. She reports that she does not drink alcohol or use illicit drugs.    Allergies  Allergen Reactions  . Tramadol Nausea And  Vomiting  . Paxil [Paroxetine Hcl]     Makes tremors worse    Medications:  Prior to Admission medications   Medication Sig Start Date End Date Taking? Authorizing Provider  acetaminophen (TYLENOL) 500 MG tablet Take 1,000 mg by mouth 2 (two) times daily as needed for moderate pain.   Yes Historical Provider, MD  aspirin 81 MG tablet Take 81 mg by mouth daily.   Yes Historical Provider, MD  busPIRone (BUSPAR) 15 MG tablet Take 15 mg by mouth 3 (three) times daily.   Yes Historical Provider, MD  diazepam (VALIUM) 2 MG tablet Take 2 mg by mouth every 6 (six) hours as needed for anxiety.   Yes Historical Provider, MD  fluticasone (FLONASE) 50 MCG/ACT nasal spray Place 2 sprays into the nose 2 (two) times daily. 08/19/13  Yes Linna Hoff, MD  levothyroxine (SYNTHROID, LEVOTHROID) 50 MCG tablet Take 50 mcg by mouth daily before breakfast.  02/18/15  Yes Historical Provider, MD  mirtazapine (REMERON) 15 MG tablet Full tab at hs 10/18/15  Yes [name], MD  trimethoprim (TRIMPEX) 100 MG tablet Take 100 mg by mouth at bedtime.   Yes Historical Provider, MD  Vitamin D, Ergocalciferol, (DRISDOL) 50000 UNITS CAPS Take 1 capsule by mouth every Monday.  02/06/12  Yes Historical Provider, MD  Memantine HCl-Donepezil HCl (NAMZARIC) 28-10 MG CP24  Take 1 tablet by mouth daily. 11/04/15   York Spanielharles K Rhonna Holster, MD  topiramate (TOPAMAX) 25 MG tablet Take 1 tablet (25 mg total) by mouth daily. 11/04/15   York Spanielharles K Margot Oriordan, MD    ROS:  Out of a complete 14 system review of symptoms, the patient complains only of the following symptoms, and all other reviewed systems are negative.  Decreased activity and appetite Restless legs, insomnia Memory loss Tremors  Blood pressure 108/59, pulse 81, height 5' 5.5" (1.664 m), weight 143 lb (64.864 kg).  Physical Exam  General: The patient is alert and cooperative at the time of the examination.  Skin: No significant peripheral edema is noted.   Neurologic  Exam  Mental status: The patient is alert and oriented x 2 at the time of the examination (not oriented to date). The Mini-Mental Status Examination done today shows a total score 20/30. The patient is able to name 13 animals in 30 seconds.   Cranial nerves: Facial symmetry is present. Speech is normal, no aphasia or dysarthria is noted. Extraocular movements are full, with exception of some restriction of superior gaze. Visual fields are full. The patient has a side-to-side head tremor, and a jaw tremor. There is no associated vocal tremor.  Motor: The patient has good strength in all 4 extremities.  Sensory examination: Soft touch sensation is symmetric on the face, arms, and legs.  Coordination: The patient has good finger-nose-finger and heel-to-shin bilaterally.  Gait and station: The patient has a normal gait. Tandem gait is normal. Romberg is negative. No drift is seen.  Reflexes: Deep tendon reflexes are symmetric.   Assessment/Plan:  1. Memory disturbance  2. Tremor  The patient will be switched from Aricept and Namenda to [employer]. She wishes to try something else for the tremor, I will give a small prescription for Topamax, she is to stop the medication if cognitive side effects are noted. The patient will follow-up otherwise in 6 months.  Marlan Palau. Keith Aldine Grainger MD 11/04/2015 6:20 PM  [facility] [address] [place], [place] [id number]  Phone [phone] Fax [phone]

## 2015-11-04 NOTE — Patient Instructions (Signed)
Tremor °A tremor is trembling or shaking that you cannot control. Most tremors affect the hands or arms. Tremors can also affect the head, vocal cords, face, and other parts of the body. There are many types of tremors. Common types include:  °· Essential tremor. These usually occur in people over the age of 40. It may run in families and can happen in otherwise healthy people.   °· Resting tremor. These occur when the muscles are at rest, such as when your hands are resting in your lap. People with Parkinson disease often have resting tremors.   °· Postural tremor. These occur when you try to hold a pose, such as keeping your hands outstretched.   °· Kinetic tremor. These occur during purposeful movement, such as trying to touch a finger to your nose.   °· Task-specific tremor. These may occur when you perform tasks such as handwriting, speaking, or standing.   °· Psychogenic tremor. These dramatically lessen or disappear when you are distracted. They can happen in people of all ages.   °Some types of tremors have no known cause. Tremors can also be a symptom of nervous system problems (neurological disorders) that may occur with aging. Some tremors go away with treatment while others do not.  °HOME CARE INSTRUCTIONS °Watch your tremor for any changes. The following actions may help to lessen any discomfort you are feeling: °· Take medicines only as directed by your health care provider.   °· Limit alcohol intake to no more than 1 drink per day for nonpregnant women and 2 drinks per day for men. One drink equals 12 oz of beer, 5 oz of wine, or 1½ oz of hard liquor. °· Do not use any tobacco products, including cigarettes, chewing tobacco, or electronic cigarettes. If you need help quitting, ask your health care provider.   °· Avoid extreme heat or cold.    °· Limit the amount of caffeine you consume as directed by your health care provider.   °· Try to get 8 hours of sleep each night. °· Find ways to manage your  stress, such as meditation or yoga. °· Keep all follow-up visits as directed by your health care provider. This is important. °SEEK MEDICAL CARE IF: °· You start having a tremor after starting a new medicine. °· You have tremor with other symptoms such as: °¨ Numbness. °¨ Tingling. °¨ Pain. °¨ Weakness. °· Your tremor gets worse. °· Your tremor interferes with your day-to-day life. °  °This information is not intended to replace advice given to you by your health care provider. Make sure you discuss any questions you have with your health care provider. °  °Document Released: 10/21/2002 Document Revised: 11/21/2014 Document Reviewed: 04/28/2014 °Elsevier Interactive Patient Education ©2016 [employer]vier Inc. ° °

## 2015-11-05 ENCOUNTER — Telehealth: Payer: Self-pay | Admitting: Neurology

## 2015-11-05 MED ORDER — MEMANTINE HCL-DONEPEZIL HCL ER 28-10 MG PO CP24: 1.0000 | ORAL_CAPSULE | Freq: Every day | ORAL | Status: DC

## 2015-11-05 MED ORDER — TOPIRAMATE 25 MG PO TABS: 25.0000 mg | ORAL_TABLET | Freq: Every day | ORAL | Status: DC

## 2015-11-05 NOTE — Telephone Encounter (Signed)
Patien'ts daughter is calling. The patient was seen in our office and given Rx's for topiramate (TOPAMAX) 25 MG tablet and Memantine HCl-Donepezil HCl (NAMZARIC) 28-10 MG CP24. The patient needs a 90 day supply called in for both Rx's instead of a 30 day supply. Please call to [facility] on [address]. Thank you.

## 2015-11-05 NOTE — Telephone Encounter (Signed)
Rx's have been sent for 90 days per request.  Receipt confirmed by pharmacy.

## 2015-12-09 ENCOUNTER — Telehealth: Payer: Self-pay

## 2015-12-09 ENCOUNTER — Telehealth: Payer: Self-pay | Admitting: Neurology

## 2015-12-09 DIAGNOSIS — G301 Alzheimer's disease with late onset: Principal | ICD-10-CM

## 2015-12-09 DIAGNOSIS — G309 Alzheimer's disease, unspecified: Secondary | ICD-10-CM

## 2015-12-09 DIAGNOSIS — F0280 Dementia in other diseases classified elsewhere without behavioral disturbance: Secondary | ICD-10-CM

## 2015-12-09 HISTORY — DX: Dementia in other diseases classified elsewhere, unspecified severity, without behavioral disturbance, psychotic disturbance, mood disturbance, and anxiety: F02.80

## 2015-12-09 NOTE — Telephone Encounter (Signed)
Guss Bunde called from [facility]. She stated that the patient's primary care doctor filled out FL2 form for the patient to move into their facility. Eunice Blase stated that they need a diagnosis of dementia or Alzheimer's in order for the patient to move into the memory care side of the facility. I advised that the patient's primary diagnosis through our office is "memory deficit." She does not think the patient will qualify with this diagnosis. I advised that I would ask Dr. Anne Hahn if a diagnosis of dementia or Alzheimer's could be given. Her call back number is [phone].

## 2015-12-09 NOTE — Telephone Encounter (Signed)
Patient's daughter is calling regarding the patient. She needs for you to call Dr. Pete Glatter at [phone] to discuss the patient. The patient will be admitted to a memory care home and will move in next week. Dr. Pete Glatter needs permission from you to share the diagnosis of Dementia with him since you first diagnosed the patient. The patient's daughter stated Dr. Pete Glatter had told her to contact you. Thank you.

## 2015-12-09 NOTE — Telephone Encounter (Signed)
I called Dr. Pete Glatter, left a message for his nurse. The patient does have a progressive dementia, in a mild to moderate range currently, likely has Alzheimer's disease.

## 2015-12-09 NOTE — Telephone Encounter (Signed)
I called Nicole [name]. The patient's been followed through this office with a progressive memory disorder, she currently scoring 20/30 on a Mini-Mental status examination. Her diagnosis to this office has "memory disturbance", but the patient likely has true dementia at this point, likely Alzheimer's disease. I will change the diagnosis in our computer. Apparently, the patient needs this changed on her FL-2 form filled out by Dr. Pete Glatter to help her get into the memory disorders unit at her extended care facility.

## 2015-12-10 NOTE — Telephone Encounter (Signed)
I called [place], Dr. Laverle Hobby CMA. I left a voicemail asking her to call me back.

## 2015-12-10 NOTE — Telephone Encounter (Signed)
Debbie with [facility] called said Dr Malena Edman office has not rec'd office note yet. This needs to be faxed to [phone], attn [place].

## 2015-12-11 NOTE — Telephone Encounter (Signed)
[place] called back. I advised that Dr. Anne Hahn added the diagnosis of Alzheimer's disease (G30.9). Dr. Pete Glatter does not need any printed/written documentation of this.

## 2016-03-14 ENCOUNTER — Telehealth: Payer: Self-pay | Admitting: Neurology

## 2016-03-14 NOTE — Telephone Encounter (Signed)
Patient's daughter Steward DroneBrenda is calling and states her mother has moved into assisted living and daughter is wondering if she needs to continue to come in for regular appointments. Please call.

## 2016-03-14 NOTE — Telephone Encounter (Signed)
Returned call to pt's dtr, Steward DroneBrenda. Recommended that pt continue to keep appts at least 1-2 times per year for follow-up and med refills. Agreeable to keeping appt already scheduled on 05/04/16 for 6 mo f/u.

## 2016-03-18 ENCOUNTER — Telehealth: Payer: Medicare Other

## 2016-03-18 NOTE — Pre-Procedure Instructions (Signed)
   No PST ordered/required

## 2016-03-28 DIAGNOSIS — H2512 Age-related nuclear cataract, left eye: Secondary | ICD-10-CM | POA: Diagnosis present

## 2016-04-06 MED ORDER — TETRACAINE HCL 0.5 % OP SOLN
OPHTHALMIC | Status: AC
Start: 2016-04-06 — End: ?
  Filled 2016-04-06: qty 2

## 2016-04-06 MED ORDER — CARBACHOL 0.01 % IO SOLN
INTRAOCULAR | Status: AC
Start: 2016-04-06 — End: ?
  Filled 2016-04-06: qty 1.5

## 2016-04-06 MED ORDER — EPINEPHRINE HCL 1 MG/ML IJ SOLN (WRAP)
Status: AC
Start: 2016-04-06 — End: ?
  Filled 2016-04-06: qty 1

## 2016-04-07 ENCOUNTER — Ambulatory Visit
Admission: RE | Admit: 2016-04-07 | Discharge: 2016-04-07 | Disposition: A | Discharge status: Home / Self Care | Payer: Medicare Other | Source: Ambulatory Visit | Attending: Ophthalmology | Admitting: Ophthalmology

## 2016-04-07 ENCOUNTER — Ambulatory Visit: Payer: Medicare Other | Admitting: Anesthesiology

## 2016-04-07 ENCOUNTER — Encounter
Admission: RE | Disposition: A | Discharge status: Home / Self Care | Payer: Self-pay | Source: Ambulatory Visit | Attending: Ophthalmology

## 2016-04-07 ENCOUNTER — Ambulatory Visit: Payer: Medicare Other | Admitting: Ophthalmology

## 2016-04-07 DIAGNOSIS — H268 Other specified cataract: Secondary | ICD-10-CM | POA: Insufficient documentation

## 2016-04-07 DIAGNOSIS — H2512 Age-related nuclear cataract, left eye: Secondary | ICD-10-CM | POA: Diagnosis present

## 2016-04-07 HISTORY — DX: Gastro-esophageal reflux disease without esophagitis: K21.9

## 2016-04-07 HISTORY — DX: Unspecified disorder of nose and nasal sinuses: H93.90

## 2016-04-07 HISTORY — DX: Unspecified osteoarthritis, unspecified site: M19.90

## 2016-04-07 HISTORY — DX: Nausea with vomiting, unspecified: R11.2

## 2016-04-07 HISTORY — DX: Other specified postprocedural states: Z98.890

## 2016-04-07 HISTORY — DX: Unspecified disorder of nose and nasal sinuses: J34.9

## 2016-04-07 HISTORY — DX: Unspecified disorder of ear, unspecified ear: H93.90

## 2016-04-07 HISTORY — PX: EXTRACTION, CATARACT, PHACO, IOL: SHX4086

## 2016-04-07 SURGERY — EXTRACTION, CATARACT, PHACOEMULSIFICATION, INSERTION INTRAOCULAR LENS (IOL)
Anesthesia: Anesthesia MAC / Sedation | Site: Eye | Laterality: Left | Wound class: Clean

## 2016-04-07 MED ORDER — PHENYLEPHRINE HCL 2.5 % OP SOLN
1.0000 [drp] | OPHTHALMIC | Status: AC
Start: 2016-04-07 — End: 2016-04-07
  Administered 2016-04-07 (×3): 1 [drp] via OPHTHALMIC

## 2016-04-07 MED ORDER — LIDOCAINE HCL 2 % EX GEL
Freq: Once | CUTANEOUS | Status: AC
Start: 2016-04-07 — End: 2016-04-07
  Administered 2016-04-07: 2 mL via TOPICAL

## 2016-04-07 MED ORDER — FENTANYL CITRATE (PF) 50 MCG/ML IJ SOLN (WRAP)
INTRAMUSCULAR | Status: AC
Start: 2016-04-07 — End: ?
  Filled 2016-04-07: qty 2

## 2016-04-07 MED ORDER — PROPOFOL 10 MG/ML IV EMUL (WRAP)
INTRAVENOUS | Status: AC
Start: 2016-04-07 — End: ?
  Filled 2016-04-07: qty 20

## 2016-04-07 MED ORDER — PROPOFOL INFUSION 10 MG/ML
INTRAVENOUS | Status: DC | PRN
Start: 2016-04-07 — End: 2016-04-07

## 2016-04-07 MED ORDER — NA HYALUR & NA CHOND-NA HYALUR 0.55-0.5 ML IO KIT
PACK | INTRAOCULAR | Status: DC | PRN
Start: 2016-04-07 — End: 2016-04-07
  Administered 2016-04-07: 1.05 mL via INTRAOCULAR

## 2016-04-07 MED ORDER — MIDAZOLAM HCL 2 MG/2ML IJ SOLN
INTRAMUSCULAR | Status: DC | PRN
Start: 2016-04-07 — End: 2016-04-07
  Administered 2016-04-07: .5 mg via INTRAVENOUS

## 2016-04-07 MED ORDER — EPINEPHRINE HCL 1 MG/ML IJ SOLN (WRAP)
Status: DC | PRN
Start: 2016-04-07 — End: 2016-04-07
  Administered 2016-04-07: 12:00:00 .3 mg

## 2016-04-07 MED ORDER — FENTANYL CITRATE (PF) 50 MCG/ML IJ SOLN (WRAP)
INTRAMUSCULAR | Status: DC | PRN
Start: 2016-04-07 — End: 2016-04-07
  Administered 2016-04-07 (×2): 25 ug via INTRAVENOUS

## 2016-04-07 MED ORDER — PROPOFOL INFUSION 10 MG/ML
INTRAVENOUS | Status: DC | PRN
Start: 2016-04-07 — End: 2016-04-07
  Administered 2016-04-07: 10 mg via INTRAVENOUS
  Administered 2016-04-07: 30 mg via INTRAVENOUS

## 2016-04-07 MED ORDER — ACETAMINOPHEN 500 MG PO TABS
500.0000 mg | ORAL_TABLET | Freq: Once | ORAL | Status: DC | PRN
Start: 2016-04-07 — End: 2016-04-07

## 2016-04-07 MED ORDER — LACTATED RINGERS IV SOLN
INTRAVENOUS | Status: DC
Start: 2016-04-07 — End: 2016-04-07
  Administered 2016-04-07: 500 mL via INTRAVENOUS

## 2016-04-07 MED ORDER — BSS IO SOLN
INTRAOCULAR | Status: DC | PRN
Start: 2016-04-07 — End: 2016-04-07
  Administered 2016-04-07 (×2): 500 mL via INTRAOCULAR

## 2016-04-07 MED ORDER — PATIENT SUPPLIED NON FORMULARY
Status: DC | PRN
Start: 2016-04-07 — End: 2016-04-07
  Administered 2016-04-07: 2 ug via TOPICAL

## 2016-04-07 MED ORDER — STERILE WATER FOR IRRIGATION IR SOLN
Status: DC | PRN
Start: 2016-04-07 — End: 2016-04-07
  Administered 2016-04-07: 250 mL

## 2016-04-07 MED ORDER — EPINEPHRINE HCL 1 MG/ML IJ SOLN (WRAP)
Status: AC
Start: 2016-04-07 — End: ?
  Filled 2016-04-07: qty 1

## 2016-04-07 MED ORDER — MIDAZOLAM HCL 2 MG/2ML IJ SOLN
INTRAMUSCULAR | Status: AC
Start: 2016-04-07 — End: ?
  Filled 2016-04-07: qty 2

## 2016-04-07 MED ORDER — TROPICAMIDE 1 % OP SOLN
1.0000 [drp] | OPHTHALMIC | Status: AC
Start: 2016-04-07 — End: 2016-04-07
  Administered 2016-04-07 (×3): 1 [drp] via OPHTHALMIC

## 2016-04-07 SURGICAL SUPPLY — 23 items
CARTRIDGE MONARCH III C (Opthamology Supply) ×2 IMPLANT
CORD BIPOLAR STRL DISP (Cautery) ×2 IMPLANT
DEVICE VISCOELASTIC KIT SOLUTION DUOVISC (Opthamology Supply) ×1 IMPLANT
DEVICE VSCELAS DUOVISC KT SOL (Opthamology Supply) ×2
GLOVE SRG 6 BGL SNSR LTX STRL PF TXTR (Glove) ×2
GLOVE SURGICAL 6 BIOGEL SENSOR POWDER (Glove) ×1 IMPLANT
GLOVE SURGICAL 6 BIOGEL SENSOR POWDER FREE TEXTURE BEAD CUFF STRAW (Glove) ×1 IMPLANT
GOWN OPTIMA STRL BACK OR (Gown) ×2 IMPLANT
IOL TORIC SN6AT3 21.5D (Lens) ×1 IMPLANT
KIT PHACO MICROSMOOTH .9MM STRL DISP (Opthamology Supply) ×2
KIT PHACOEMULSIFICATION OD.9 MM (Opthamology Supply) ×1 IMPLANT
KIT PHACOEMULSIFICATION OD.9 MM MICROSMOOTH (Opthamology Supply) IMPLANT
PACK CATARACT - 08 (Pack) ×2 IMPLANT
PACK CATARACT - JULIANA PARK (Opthamology Supply) ×2 IMPLANT
PACK CATARACT-08 AS13353-13 (Pack) ×1 IMPLANT
PACK TUBNG INFINITY .9MM (Pack) ×2 IMPLANT
PROBE HEMOSTATIC ERASER STRAIGHT OD18 GA BIPOLAR HOLLOW CORE TARGET (Cautery) ×1 IMPLANT
PROBE HMERSR STRG WTFLD ERS 18GA BP HLW (Cautery) ×2 IMPLANT
SET ADMIN IRRIG BSS (Solution) ×2 IMPLANT
SUTURE ETHILON 10-0 TG16 12IN (Suture) IMPLANT
WATER STERILE PLASTIC POUR BOTTLE 250 ML (Irrigation Solutions) ×1 IMPLANT
WATER STRL  250ML LF PLS PR BTL (Irrigation Solutions) ×1
WATER STRL 250ML LF PLS PR BTL (Irrigation Solutions) ×1

## 2016-04-07 NOTE — Discharge Instructions (Addendum)
Cataract Treatment: Implanting a New Lens (IOL)        A cataract is clouding of your eye's lens. Cataracts can be treated with surgery to remove the cloudy lens and replace it with a plastic lens called an IOL(intraocular lens). The IOL can be placed either in front of or behind the iris. Your doctor will choose the location based on the condition of the capsule and the type of IOL you will receive. An IOL doesn't change the appearance of your eye. But it can improve how well you see.  Posterior Chamber Lens  In most cases, an IOL is implanted in the posterior chamber. Flexible loops hold the IOL in place. The posterior capsule also provides support for the IOL.  Anterior Chamber Lens  If the capsule behind the lens is weak or damaged, your eye doctor will place a special lens in the anterior chamber. Small loops hold the IOL in place.  Types of IOLs  An IOL doesn't change the appearance of your eye. But it can improve how well you see. Your eye doctor will select a new lens to fit your eye and vision needs. IOLs are made in slightly different shapes. In most cases, the IOL will last a lifetime. Talk to your doctor about which lenses are right for you.   Special materials.IOLs are made of materials that won't irritate your eye. These include silicone, acrylic, and PMMA (a type of hard plastic).   Foldable lenses. Many IOLs are foldable. This allows them to be inserted through small incisions in the cornea or sclera (the white part of the eye).   Multifocal and accommodating lenses. These lenses use advanced technology to improve your range of vision, including up close. For best results, they are put into both eyes.   2000-2011 [employer], [address], [place] [address]. All rights reserved. This information is not intended as a substitute for professional medical care. Always follow your healthcare professional's instructions.  Anesthesia: After Your Surgery  You've just had surgery. During  surgery, you received medication called anesthesia to keep you comfortable and pain-free. After surgery, you may experience some pain or nausea. This is normal. Here are some tips for feeling better and recovering after surgery.     Stay on schedule with your medication.   Going Home  Your doctor or nurse will show you how to take care of yourself when you go home. He or she will also answer your questions. Have an adult family member or friend drive you home. For the first 24 hours after your surgery:   Do not drive or use heavy equipment.   Do not make important decisions or sign documents.   Avoid alcohol.   Have someone stay with you, if possible. They can watch for problems and help keep you safe.  Be sure to keep all follow-up doctor's appointments. And rest after your procedure for as long as your doctor tells you to.  Coping with Pain  If you have pain after surgery, pain medication will help you feel better. Take it as directed, before pain becomes severe. Also, ask your doctor or pharmacist about other ways to control pain, such as with heat, ice, and relaxation. And follow any other instructions your surgeon or nurse gives you.  Tips for Taking Pain Medication  To get the best relief possible, remember these points:   Pain medications can upset your stomach. Taking them with a little food may help.   Most   pain relievers taken by mouth need at least 20 to 30 minutes to take effect.   Taking medication on a schedule can help you remember to take it. Try to time your medication so that you can take it before beginning an activity, such as dressing, walking, or sitting down for dinner.   Constipation is a common side effect of pain medications. Drink lots of fluids. Eating fruit and vegetables can also help. Don't take laxatives unless your surgeon has prescribed them.   Mixing alcohol and pain medication can cause dizziness and slow your breathing. It can even be fatal. Don't drink alcohol while  taking pain medication.   Pain medication can slow your reflexes. Don't drive or operate machinery while taking pain medication.  Managing Nausea  Some people have an upset stomach after surgery. This is often due to anesthesia, pain, pain medications, or the stress of surgery. The following tips will help you manage nausea and get good nutrition as you recover. If you were on a special diet before surgery, ask your doctor if you should follow it during recovery. These tips may help:   Don't push yourself to eat. Your body will tell you what to eat and when.   Start off with liquids and soup. They are easier to digest.   Progress to semisolids (mashed potatoes, applesauce, and gelatin) as you feel ready.   Slowly move to solid foods. Don't eat fatty, rich, or spicy foods at first.   Don't force yourself to have three large meals a day. Instead, eat smaller amounts more often.   Take pain medications with a small amount of solid food, such as crackers or toast.  Call Your Surgeon If.   You still have pain an hour after taking medication (it may not be strong enough).   You feel too sleepy, dizzy, or groggy (medication may be too strong).   You have side effects like nausea, vomiting, or skin changes (rash, itching, or hives).    [address] [place], [place] [address]. All rights reserved. This information is not intended as a substitute for professional medical care. Always follow your healthcare professional's instructions.    Patient to leave patch and shield on until follow-up in office tomorrow.  General Information for Discharge Instructions  Cataract with Topical Anesthesia     Do not drive a car or operate machinery for 24 hours   Do not consume alcohol, tranquilizers or sleeping medications for 24 hours unless approved by surgeon   Do not make important decisions or sign any important papers within 24 hours   Stay in the company of a responsible person  tonight    Activity     Rest for 24 hours   Restrictions: may stoop only. Avoid bending over.   Avoid straining or heavy lifting    You may be up and about; TV, writing and reading are premitted Treatment     Leave Patch on operative eye until the surgeon removes it   Sleep on 1 or 2 pillows   Keep eye shield in place until seen by physician   Take post-op kit to your post-op appointment tomorrow, including all eyedrops.     Medications     Use pain medications as directed by your physician     May take Tylenol Advil or  as needed for pain (extra strength if needed) Other     Resume normal diet   Call MD for severe pain, headache, nausea  and vomiting       Follow-up appointment:    Tomorrow - Dr. Gary Fleet [phone]  Discharge Instructions: After Your Surgery  You've just had surgery. During surgery you were given medicine called anesthesia to keep you relaxed and free of pain. After surgery you may have some pain or nausea. This is common. Here are some tips for feeling better and getting well after surgery.     Stay on schedule with your medication.   Going home  Your doctor or nurse will show you how to take care of yourself when you go home. He or she will also answer your questions. Have an adult family member or friend drive you home. For the first 24 hours after your surgery:   Do not drive or use heavy equipment.   Do not make important decisions or sign legal papers.   Do not drink alcohol.   Have someone stay with you, if needed. He or she can watch for problems and help keep you safe.  Be sure to go to all follow-up visits with your doctor. And rest after your surgery for as long as your doctor tells you to.  Coping with pain  If you have pain after surgery, pain medicine will help you feel better. Take it as told, before pain becomes severe. Also, ask your doctor or pharmacist about other ways to control pain. This might be with heat, ice, or relaxation. And follow any other  instructions your surgeon or nurse gives you.  Tips for taking pain medicine  To get the best relief possible, remember these points:   Pain medicines can upset your stomach. Taking them with a little food may help.   Most pain relievers taken by mouth need at least 20 to 30 minutes to start to work.   Taking medicine on a schedule can help you remember to take it. Try to time your medicine so that you can take it before starting an activity. This might be before you get dressed, go for a walk, or sit down for dinner.   Constipation is a common side effect of pain medicines. Call your doctor before taking any medicines such as laxatives or stool softeners to help ease constipation. Also ask if you should skip any foods. Drinkinglots of fluids andeating foodssuch as fruits and vegetables that are high in fiber can also help. Remember, do not take laxatives unless your surgeon has prescribed them.   Drinking alcohol and taking pain medicine can cause dizziness and slow your breathing. It can even be deadly. Do not drink alcohol while taking pain medicine.   Pain medicine can make you react more slowly to things. Do not drive or run machinery while taking pain medicine.  Your health care providermay tell you to take acetaminophen to help ease your pain. Ask him or her how much you are supposed to take each day. Acetaminophen or other pain relievers may interact with your prescription medicines or other over-the-counter (OTC) drugs. Some prescription medicines have acetaminophen and other ingredients.Using both prescription and OTC acetaminophenfor paincan cause you to overdose. Readthe labels on your OTC medicineswith care. This will help youto clearly know the list of ingredients, how much to take, and anywarnings. It may also help you not take too muchacetaminophen.If you have questions or do not understand the information, ask your pharmacist or health care provider to explain it to you before you  take the OTC medicine.  Managing nausea  Some people have an upset  stomach after surgery. This is often because of anesthesia, pain, or pain medicine, or the stress of surgery. These tips will help you handle nausea and eat healthy foods as you get better. If you were on a special food plan before surgery, ask your doctor if you should follow it while you get better. These tips may help:   Do not push yourself to eat. Your body will tell you when to eat and how much.   Start off with clear liquids and soup. They are easier to digest.   Next try semi-solid foods, such as mashed potatoes, applesauce, and gelatin, as you feel ready.   Slowly move to solid foods. Don't eat fatty, rich, or spicy foods at first.   Do not force yourself to have 3 large meals a day. Instead eat smaller amounts more often.   Take pain medicines with a small amount of solid food, such as crackers or toast, to avoid nausea.    Call your surgeon if.   You still have pain an hour after taking medicine. The medicine may not be strong enough.   You feel too sleepy, dizzy, or groggy. The medicine may be too strong.   You have side effects like nausea, vomiting, or skin changes, such as rash, itching, or hives.     If you have obstructive sleep apnea  You were given anesthesia medicine during surgery to keep you comfortable and free of pain. After surgery, you may have more apnea spells because of this medicine and other medicines you were given. The spells may last longer than usual.  At home:   Keep using the continuous positive airway pressure (CPAP) device when you sleep. Unless your health care provider tells you not to, use it when you sleep, day or night. CPAP is a common device used to treat obstructive sleep apnea.   Talk with your provider before taking any pain medicine, muscle relaxants, or sedatives. Your provider will tell you about the possible dangers of taking these medicines.  Date Last Reviewed: 08/29/2013    2000-2016 [employer] [address], [place], [place] [address]. All rights reserved. This information is not intended as a substitute for professional medical care. Always follow your healthcare professional's instructions.

## 2016-04-07 NOTE — Anesthesia Postprocedure Evaluation (Signed)
Anesthesia Post Evaluation    Patient: Maureen Vasquez    Procedure(s) with comments:  EXTRACTION, CATARACT, PHACO, IOL - LEFT PHACO W/IOL    Anesthesia type: MAC    Last Vitals:   Filed Vitals:    04/07/16 1340   BP:    Pulse: 86   Temp:    Resp:    SpO2: 97%       Patient Location: Phase II PACU      Post Pain: Patient not complaining of pain, continue current therapy    Mental Status: awake and alert    Respiratory Function: tolerating room air    Cardiovascular: stable    Nausea/Vomiting: patient not complaining of nausea or vomiting    Hydration Status: adequate    Post Assessment: no apparent anesthetic complications, no evidence of recall and no reportable events          Anesthesia Qualified Clinical Data Registry    Central Line      CVC insertion : NO                                               Perioperative temperature management      General/neuraxial anesthesia > or = 60 minutes (excluding CABG) : NO                                Administration of antibiotic prophylaxis      Age > or = 18, with IV access, with surgical procedure for which antibiotic prophylaxis indicated, and not on chronic antibiotics : NO                  Medication Administration      Ordering or administration of drug inconsistent with intended drug, dose, delivery or timing : NO      Dental loss/damage      Dental injury with administration of anesthesia : NO      Difficult intubation due to unrecognized difficult airway        Elective airway procedure including but not limited to: tracheostomy, fiberoptic bronchoscopy, rigid bronchoscopy; jet ventilation; or elective use of a device to facilitate airway management such as a Glidescope : NO                > Unanticipated difficult intubation post pre-evaluation : NO      Aspiration of gastric contents        Aspiration of gastric contents : NO                    Surgical fire        Procedure requiring electrocautery/laser : NO                    Immediate perioperative cardiac  arrest        Cardiac arrest in OR or PACU : NO                    Unplanned hospital admission        Unplanned hospital admission for initially intended outpatient anesthesia service : NO      Unplanned ICU admission        Unplanned ICU admission related to anesthesia occurring within 24 hours of induction or start of MAC :  NO      Surgical case cancellation        Cancellation of procedure after care already started by anesthesia care team : NO      Post-anesthesia transfer of care checklist/protocol to PACU        Transfer from OR to PACU upon case conclusion : YES              > Use of PACU transfer checklist/protocol : YES     (Includes the key elements of: patient identification, responsible practitioner identification (PACU nurse or advanced practitioner), discussion of pertinent history and procedure course, intraoperative anesthetic management, post-procedure plans, acknowledgement/questions)    Post-anesthesia transfer of care checklist/protocol to ICU        Transfer from OR to ICU upon case conclusion : NO                    Post-operative nausea/vomiting risk protocol        Post-operative nausea/vomiting risk protocol : NO  Patient > or = 18 with care initiated by anesthesia team that has a risk factor screen for post-op nausea/vomiting (Includes female, hx PONV, or motion sickness, non-smoker, intended opioid administration for post-op analgesia.)    Anaphylaxis        Anaphylaxis during anesthesia services : NO    (Inclusive of any suspected transfusion reaction in association with blood-bank confirmed blood product incompatibility)              Virgilio Frees, 04/07/2016 3:01 PM

## 2016-04-07 NOTE — Anesthesia Preprocedure Evaluation (Addendum)
Anesthesia Evaluation    AIRWAY    Mallampati: II    TM distance: <3 FB       CARDIOVASCULAR    cardiovascular exam normal       DENTAL         PULMONARY    pulmonary exam normal     OTHER FINDINGS                      Anesthesia Plan    ASA 3     MAC                     intravenous induction   Detailed anesthesia plan: MAC        Post op pain management: per surgeon        Plan discussed with CRNA.

## 2016-04-07 NOTE — H&P (View-Only) (Signed)
Patient seen and examined.  No changes to current H & P Patient presents for cataract surgery OS with complaints of problems seeing to read and watch TV  Best corrected vision OS is 20/60

## 2016-04-07 NOTE — Consults (Signed)
Patient seen and examined.  No changes to current H & P Patient presents for cataract surgery OS with complaints of problems seeing to read and watch TV  Best corrected vision OS is 20/60

## 2016-04-07 NOTE — Transfer of Care (Signed)
Procedure:  EXTRACTION, CATARACT, PHACO, IOL - LEFT PHACO W/IOL    MAC Anesthesia  PACU Phase 2  See PACU Vital Signs  Pain Well controlled  Hemodynamically stable  Appropriate mental status  Tolerating Room Air  N/V Controlled  No Obvious anesthetic complications

## 2016-04-07 NOTE — Op Note (Signed)
FULL OPERATIVE NOTE    Date Time: 04/07/2016 12:31 PM  Patient Name: Maureen Vasquez, Maureen Vasquez [id number]  Attending Physician: Ellery Plunk, MD      Date of Operation:   04/07/2016    Providers Performing:   Ellery Plunk, MD    Operative Procedure:   Phacoemulsification with PC IOL OS    Preoperative Diagnosis:   Visually significant cataract OS    Postoperative Diagnosis:   Visually significant cataract OS    Indications:   Progressively decreased vision OS    Anesthesia:    Monitored local    Operative Notes:   Patient is a 82 y.o. female who presents with the complaints of decreased vision OS and complaints of problems seeing to drive esp. At night.. Visual acuity is 20/60 best corrected OS  Slit lamp exam remarkable for 2+ nuclear and cortical lens changes OS.  Dilated fundus exam remarkable for C:D 0.3 with normal macula, periphery and vessels.  Impression and Plan was that of visually significant cataract OS.  Risks, benefits and alternatives of cataract surgery were discussed with the patient, including the options of premium lenses.  All questions were answered and consent was obtained.       Patient was given the usual topical lidocaine gel in the holding area.  She was sat upright and the 0 and 180 degree axes were marked. A [place] balloon was placed as well.  In the operating room, the patient was prepped and draped in the usual sterile manner under IV Sedation.  Lid speculum was placed in the operative eye. Conjunctiva and tenon's were removed with .12 forceps and Westcott scissors.  Hemostasis was achieved with wet-field cautery.  Crescent blade was used to make a scleral groove and delaminated forward.  Anterior chamber paracentesis was then performed.  A 2.75 keratome was then used to enter the chamber through the wound.  Viscoelastic was then placed in the eye. A toric marker was used to mark the 15 to 20 degree axis. A pre-bent cystotome was then used to make a capsulorhexis opening .  Hydro-dissection of the  nucleus was then performed with BSS.The nucleus was then removed in a divide and conquer fashion.  Residual cortex was then removed with irrigated I & A.  Provisc was then placed in the capsular bag and a [id number] 21.5 diopter PC IOL was then placed into the capsular bag and rotated into position.  Residual viscoelastic was then removed with automated I & A.  The wound was checked for leaks and felt to be leak proof.  Conjunctiva was re-apposed.  The patient's antibiotic and Prednisolone drops were then placed in the eye.  The eye was  patched and shielded and taken to recovery in stable condition.      Complications:   none      Signed by: Ellery Plunk, MD

## 2016-04-07 NOTE — Interval H&P Note (Signed)
Patient seen and examined.  No changes to current H & P  Patient presents for cataract surgery OS with problems seeing to drive especially at night bothered by bright lights and halos.  Best corrected vision OS is 20/60

## 2016-04-09 ENCOUNTER — Encounter: Payer: Self-pay | Admitting: Ophthalmology

## 2016-04-30 ENCOUNTER — Emergency Department: Payer: Medicare Other

## 2016-04-30 ENCOUNTER — Emergency Department
Admission: EM | Admit: 2016-04-30 | Discharge: 2016-04-30 | Disposition: A | Discharge status: Home / Self Care | Payer: Medicare Other | Attending: Emergency Medical Services | Admitting: Emergency Medical Services

## 2016-04-30 DIAGNOSIS — M47894 Other spondylosis, thoracic region: Secondary | ICD-10-CM

## 2016-04-30 DIAGNOSIS — X509XXA Other and unspecified overexertion or strenuous movements or postures, initial encounter: Secondary | ICD-10-CM | POA: Insufficient documentation

## 2016-04-30 DIAGNOSIS — X500XXA Overexertion from strenuous movement or load, initial encounter: Secondary | ICD-10-CM | POA: Insufficient documentation

## 2016-04-30 DIAGNOSIS — Z79899 Other long term (current) drug therapy: Secondary | ICD-10-CM | POA: Insufficient documentation

## 2016-04-30 DIAGNOSIS — Y9389 Activity, other specified: Secondary | ICD-10-CM | POA: Insufficient documentation

## 2016-04-30 DIAGNOSIS — S29012A Strain of muscle and tendon of back wall of thorax, initial encounter: Secondary | ICD-10-CM | POA: Insufficient documentation

## 2016-04-30 DIAGNOSIS — S29019A Strain of muscle and tendon of unspecified wall of thorax, initial encounter: Secondary | ICD-10-CM

## 2016-04-30 DIAGNOSIS — Z8572 Personal history of non-Hodgkin lymphomas: Secondary | ICD-10-CM | POA: Insufficient documentation

## 2016-04-30 DIAGNOSIS — Z8674 Personal history of sudden cardiac arrest: Secondary | ICD-10-CM | POA: Insufficient documentation

## 2016-04-30 LAB — CBC AND DIFFERENTIAL
Basophils Absolute Automated: 0.02 10*3/uL (ref 0.00–0.20)
Basophils Automated: 0.3 %
Eosinophils Absolute Automated: 0.11 10*3/uL (ref 0.00–0.70)
Eosinophils Automated: 1.7 %
Hematocrit: 40.2 % (ref 37.0–47.0)
Hgb: 14.2 g/dL (ref 12.0–16.0)
Immature Granulocytes Absolute: 0.01 10*3/uL
Immature Granulocytes: 0.2 %
Lymphocytes Absolute Automated: 1.29 10*3/uL (ref 0.50–4.40)
Lymphocytes Automated: 20.1 %
MCH: 30.9 pg (ref 28.0–32.0)
MCHC: 35.3 g/dL (ref 32.0–36.0)
MCV: 87.4 fL (ref 80.0–100.0)
MPV: 9.8 fL (ref 9.4–12.3)
Monocytes Absolute Automated: 0.79 10*3/uL (ref 0.00–1.20)
Monocytes: 12.3 %
Neutrophils Absolute: 4.21 10*3/uL (ref 1.80–8.10)
Neutrophils: 65.6 %
Nucleated RBC: 0 /100 WBC (ref 0.0–1.0)
Platelets: 243 10*3/uL (ref 140–400)
RBC: 4.6 10*6/uL (ref 4.20–5.40)
RDW: 12 % (ref 12–15)
WBC: 6.42 10*3/uL (ref 3.50–10.80)

## 2016-04-30 LAB — COMPREHENSIVE METABOLIC PANEL
ALT: 8 U/L (ref 0–55)
AST (SGOT): 22 U/L (ref 5–34)
Albumin/Globulin Ratio: 1.7 (ref 0.9–2.2)
Albumin: 4.1 g/dL (ref 3.5–5.0)
Alkaline Phosphatase: 94 U/L (ref 37–106)
Anion Gap: 11 (ref 5.0–15.0)
BUN: 11 mg/dL (ref 7–19)
Bilirubin, Total: 0.6 mg/dL (ref 0.2–1.2)
CO2: 20 mEq/L — ABNORMAL LOW (ref 22–29)
Calcium: 9.3 mg/dL (ref 7.9–10.2)
Chloride: 102 mEq/L (ref 100–111)
Creatinine: 0.7 mg/dL (ref 0.6–1.0)
Globulin: 2.4 g/dL (ref 2.0–3.6)
Glucose: 92 mg/dL (ref 70–100)
Potassium: 4.5 mEq/L (ref 3.5–5.1)
Protein, Total: 6.5 g/dL (ref 6.0–8.3)
Sodium: 133 mEq/L — ABNORMAL LOW (ref 136–145)

## 2016-04-30 LAB — LIPASE: Lipase: 11 U/L (ref 8–78)

## 2016-04-30 LAB — URINALYSIS, REFLEX TO MICROSCOPIC EXAM IF INDICATED
Bilirubin, UA: NEGATIVE
Blood, UA: NEGATIVE
Glucose, UA: NEGATIVE
Ketones UA: 20 — AB
Nitrite, UA: NEGATIVE
Protein, UR: NEGATIVE
Specific Gravity UA: 1.009 (ref 1.001–1.035)
Urine pH: 7 (ref 5.0–8.0)
Urobilinogen, UA: NORMAL mg/dL

## 2016-04-30 LAB — GFR: EGFR: >60

## 2016-04-30 MED ORDER — LIDOCAINE 5 % EX PTCH
1.0000 | MEDICATED_PATCH | Freq: Every day | CUTANEOUS | Status: DC | PRN
Start: 2016-04-30 — End: 2021-04-07

## 2016-04-30 MED ORDER — MORPHINE SULFATE 2 MG/ML IJ/IV SOLN (WRAP)
2.0000 mg | Freq: Once | Status: AC
Start: 2016-04-30 — End: 2016-04-30
  Administered 2016-04-30: 2 mg via INTRAVENOUS
  Filled 2016-04-30: qty 1

## 2016-04-30 MED ORDER — ONDANSETRON HCL 4 MG/2ML IJ SOLN
4.0000 mg | Freq: Once | INTRAMUSCULAR | Status: AC
Start: 2016-04-30 — End: 2016-04-30
  Administered 2016-04-30: 4 mg via INTRAVENOUS
  Filled 2016-04-30: qty 2

## 2016-04-30 MED ORDER — ONDANSETRON 4 MG PO TBDP
4.0000 mg | ORAL_TABLET | Freq: Three times a day (TID) | ORAL | Status: DC | PRN
Start: 2016-04-30 — End: 2021-03-05

## 2016-04-30 MED ORDER — HYDROCODONE-ACETAMINOPHEN 5-325 MG PO TABS
1.0000 | ORAL_TABLET | Freq: Three times a day (TID) | ORAL | Status: DC | PRN
Start: 2016-04-30 — End: 2018-03-01

## 2016-04-30 MED ORDER — SODIUM CHLORIDE 0.9 % IV BOLUS
500.0000 mL | Freq: Once | INTRAVENOUS | Status: AC
Start: 2016-04-30 — End: 2016-04-30
  Administered 2016-04-30: 500 mL via INTRAVENOUS

## 2016-04-30 NOTE — ED Notes (Signed)
Pt presents to the ED for mid and upper back pain after "twisting and lifting" yesterday afternoon. Pain began last night around 1800. Pt requesting x-ray, states she had some disc problems in the past. Pt notes pain is worse with movements. Denies numbness and tingling to legs. Pt alert and oriented. Husband at the bedside.

## 2016-04-30 NOTE — ED Provider Notes (Addendum)
Physician/Midlevel provider first contact with patient: 04/30/16 1506         EMERGENCY DEPARTMENT HISTORY AND PHYSICAL EXAM    Date: 04/30/2016  Patient Name: Maureen Vasquez  Attending Physician: Alexander-Nicholas D. Jacob Chamblee, MD      History of Presenting Illness     Chief Complaint:   Chief Complaint   Patient presents with   . Back Pain            Historian:  patient    82 y.o. female p/w midthoracic spine/back pain starting yesterday after twisting/lifting boxes.  Pain worse with movement and certain positions.  Able to find comfortable position.  Does not radiate below waist or down legs. No extremity pain or weakness/paresthesias.  No fever/chills, cough, urinary S/S. No rash. +mild nausea when pain severe but no emesis. No diarrhea. No focal abdominal pain.  Denies SSCP or SOB.  No palpitations, syncope.  Pt reports that when he started Lexapro he removed all firearms and ammunition from his household.  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      PMD:  Mack Hook, MD      Nursing Notes Review   Nursing Notes were reviewed.     Previous Records Review   Previous records were reviewed to the extent practicable for the current presentation.      Review of Systems     Constitutional:  No fever  Eyes: No discharge   ENT: No ST  CV:  No CP   Resp:  No SOB or cough  GI: No abd pain, N, V, D  GU: No dysuria  MS:  No swelling/pain  +thoracic to upper lumbar pain  Skin: No rash  Neuro:  No HA, no weakness/numbness  Psych:  No behavior changes  All other systems reviewed and negative    See HPI for review of systems that is relevant to the current presentation.   All other systems reviewed: negative.     Past Medical History     Past Medical History   Diagnosis Date   . H/O acute pancreatitis 1990's   . Back pain    . Seasonal allergic rhinitis    . NHL (non-Hodgkin's lymphoma) October 2013     w/ Splenic Nodules   . Lymphona, mantle cell, inguinal region/lower limb    .  Post-operative nausea and vomiting      Cardiac arrest 34 yrs ago with ruptured appendix/also in 2000 w/endoscopy   . Gastroesophageal reflux disease    . Malignant neoplasm 2014     Non Hodgkins lymphoma/s/p chemo   . Encounter for blood transfusion 2015   . Ear, nose and throat disorder      sinus issues   . Arthritis      Rt hand index finger   . Low back pain 2014     3 slipped discs       Past Surgical History     Past Surgical History   Procedure Laterality Date   . Appendectomy       Age of 84   . Excision, squamous cell       Right hand/ 2000   . Placement, mediport     . Extraction, cataract, phaco, iol Left 04/07/2016     Procedure: EXTRACTION, CATARACT, PHACO, IOL;  Surgeon: Ellery Plunk, MD;  Location: [facility];  Service: Ophthalmology;  Laterality: Left;  LEFT PHACO W/IOL       Family History  Family History   Problem Relation Age of Onset   . Heart attack Mother    . Heart disease Mother    . Parkinsonism Mother    . Hyperlipidemia Mother    . Heart disease Sister    . Heart disease Brother        Social History     Social History     Social History   . Marital Status: Married     Spouse Name: N/A   . Number of Children: N/A   . Years of Education: N/A     Social History Main Topics   . Smoking status: Never Smoker    . Smokeless tobacco: Not on file   . Alcohol Use: No   . Drug Use: No   . Sexual Activity: Not on file     Other Topics Concern   . Not on file     Social History Narrative       Allergies     Allergies   Allergen Reactions   . Aspirin Other (See Comments)     Oral and stomach irritation if taken daily   . Codeine Nausea Only   . Salicylates        Home Medications     Home medications reviewed by ED MD at 3:28 PM    Discharge Medication List as of 04/30/2016  5:45 PM      CONTINUE these medications which have NOT CHANGED    Details   MAGNESIUM CARBONATE PO Take by mouth., Until Discontinued, Historical Med      vitamin D, cholecalciferol, 400 units tablet Take 400 Units by  mouth daily., Until Discontinued, Historical Med      amoxicillin-clavulanate (AUGMENTIN) 875-125 MG per tablet Take 1 tablet by mouth 2 (two) times daily., Starting 03/28/2014, Until Discontinued, Print      benzonatate (TESSALON) 100 MG capsule Take 1 capsule (100 mg total) by mouth 3 (three) times daily as needed for Cough., Starting 03/28/2014, Until Discontinued, Print      docusate sodium (COLACE) 100 MG capsule Take 100 mg by mouth 2 (two) times daily., Until Discontinued, Historical Med      lactobacillus/streoptococcus (RISAQUAD) Cap Take 1 capsule by mouth daily., Starting 03/28/2014, Until Discontinued, Print      mirtazapine (REMERON) 15 MG tablet Take 1 tablet (15 mg total) by mouth nightly., Starting 03/28/2014, Until Discontinued, Print      vitamin B-12 (CYANOCOBALAMIN) 1000 MCG tablet Take 1,000 mcg by mouth daily., Until Discontinued, Historical Med             ED Medications Administered     ED Medication Orders     Start Ordered     Status Ordering Provider    04/30/16 1520 04/30/16 1519  sodium chloride 0.9 % bolus 500 mL   Once     Route: Intravenous  Ordered Dose: 500 mL     Last MAR action:  Stopped Safira Proffit, ALEXANDER-NICHOLAS D    04/30/16 1520 04/30/16 1519  morphine injection 2 mg   Once     Route: Intravenous  Ordered Dose: 2 mg     Last MAR action:  Given Roselind Klus, ALEXANDER-NICHOLAS D    04/30/16 1520 04/30/16 1519  ondansetron (ZOFRAN) injection 4 mg   Once     Route: Intravenous  Ordered Dose: 4 mg     Last MAR action:  Given Casi Westerfeld, ALEXANDER-NICHOLAS D            VS  Patient Vitals for the past 24 hrs:   BP Temp Temp src Pulse Resp SpO2   04/30/16 1705 148/71 mmHg - - 78 18 99 %   04/30/16 1458 152/75 mmHg 98.1 F (36.7 C) Oral (!) 110 18 94 %       Physical Exam     Constitutional: Vital signs reviewed. Well appearing.  Head: Normocephalic, atraumatic  Eyes: Conjunctiva and sclera are normal.  No injection or discharge. PERL, EOMI  Ears, Nose, Throat:  Normal external  examination of the nose and ears.    Neck: Normal range of motion. Trachea midline.   Respiratory/Chest:  No respiratory distress.   CTAB.  No wheezing, rales, rhonchi.  Cardiovascular: tachy, regular. No murmurs.   Abdomen: Soft and non-tender. No guarding. No masses.  No rebound or guarding. No saddle anesthesias.  Back:   No rash. No midline TTP.  +mild scoliosis. No paraspinal mass/spasms.  No point TTP. Pain with AROM but FROM.  Upper Extremity:  No edema. No cyanosis.  Equal grips.  Lower Extremity:  No edema. No cyanosis.  Neg SLR bilaterally.  Neurological:  Alert and conversant.  5/5 strength x4 ext.  No focal motor deficits by observation. Speech normal.  Skin: Warm and dry. No rash.  Psychiatric:  Normal affect.  Normal insight.        Diagnostic Study Results     Labs     Results     Procedure Component Value Units Date/Time    UA (Reflex to Microscopic) [[id number]]  (Abnormal) Collected:  04/30/16 1722    Specimen Information:  Urine Updated:  04/30/16 1731     Urine Type Clean Catch      Color, UA Straw      Clarity, UA Clear      Specific Gravity UA 1.009      Urine pH 7.0      Leukocyte Esterase, UA Trace (A)      Nitrite, UA Negative      Protein, UR Negative      Glucose, UA Negative      Ketones UA 20 (A)      Urobilinogen, UA Normal mg/dL      Bilirubin, UA Negative      Blood, UA Negative      RBC, UA 0 - 2 /hpf      WBC, UA 0 - 5 /hpf      Squamous Epithelial Cells, Urine 0 - 5 /hpf     Comprehensive Metabolic Panel (CMP) [[id number]]  (Abnormal) Collected:  04/30/16 1536    Specimen Information:  Blood Updated:  04/30/16 1601     Glucose 92 mg/dL      BUN 11 mg/dL      Creatinine 0.7 mg/dL      Sodium 865 (L) mEq/L      Potassium 4.5 mEq/L      Chloride 102 mEq/L      CO2 20 (L) mEq/L      Calcium 9.3 mg/dL      Protein, Total 6.5 g/dL      Albumin 4.1 g/dL      AST (SGOT) 22 U/L      ALT 8 U/L      Alkaline Phosphatase 94 U/L      Bilirubin, Total 0.6 mg/dL      Globulin 2.4 g/dL       Albumin/Globulin Ratio 1.7      Anion Gap 11.0     Lipase [[id number]] Collected:  04/30/16  1536    Specimen Information:  Blood Updated:  04/30/16 1601     Lipase 11 U/L     GFR [[id number]] Collected:  04/30/16 1536     EGFR >60.0 Updated:  04/30/16 1601    CBC with Differential [[id number]] Collected:  04/30/16 1536    Specimen Information:  Blood from Blood Updated:  04/30/16 1545     WBC 6.42 x10 3/uL      Hgb 14.2 g/dL      Hematocrit 78.2 %      Platelets 243 x10 3/uL      RBC 4.60 x10 6/uL      MCV 87.4 fL      MCH 30.9 pg      MCHC 35.3 g/dL      RDW 12 %      MPV 9.8 fL      Neutrophils 65.6 %      Lymphocytes Automated 20.1 %      Monocytes 12.3 %      Eosinophils Automated 1.7 %      Basophils Automated 0.3 %      Immature Granulocyte 0.2 %      Nucleated RBC 0.0 /100 WBC      Neutrophils Absolute 4.21 x10 3/uL      Abs Lymph Automated 1.29 x10 3/uL      Abs Mono Automated 0.79 x10 3/uL      Abs Eos Automated 0.11 x10 3/uL      Absolute Baso Automated 0.02 x10 3/uL      Absolute Immature Granulocyte 0.01 x10 3/uL           Radiologic Studies  Radiology Results (24 Hour)     Procedure Component Value Units Date/Time    Thoracic Spine AP and Lateral [[id number]] Collected:  04/30/16 1633    Order Status:  Completed Updated:  04/30/16 1638    Narrative:      HISTORY: Back pain after lifting.    COMPARISON: CT chest 03/04/2014.    TECHNIQUE: Frontal and lateral views of the thoracic spine.    FINDINGS: Thoracic vertebral bodies are normal in height and alignment.  Please note poor visualization of T1, T2, and T3 on the lateral  projection due to overlying soft tissue artifact. Multilevel endplate  degenerative changes are noted. There are no suspicious focal lesions.  Calcified mediastinal node is unchanged. Visualized ribs are intact.      Impression:       No acute fracture or malalignment of the thoracic spine,  noting poor visualization of T1-T3. If there are persistent concerns, CT  is available for further  evaluation.    Elizebeth Koller, MD   04/30/2016 4:34 PM      Lumbar Spine AP/Lateral [[id number]] Collected:  04/30/16 1618    Order Status:  Completed Updated:  04/30/16 1625    Narrative:      HISTORY: Lumbar pain after lifting boxes.    COMPARISON: CT abdomen 06/01/2012.    TECHNIQUE: Frontal and lateral views of the lumbar spine. A total of 2  films obtained.    FINDINGS: Approximately 2 mm of grade 1 anterolisthesis of L5 on S1 is  unchanged and likely due to moderate lower lumbar facet degenerative  changes. Alignment is otherwise anatomic. Vertebral body heights are  maintained. There are are multilevel endplate degenerative changes  manifesting as disc height loss and endplate sclerosis with marginal  osteophytosis. These changes are greatest at L4-5 and L5-S1. No  suspicious focal  lesions are noted. Aortic vascular calcification is  identified. Sacroiliac joints are symmetric.      Impression:         1. No acute fracture or malalignment of the lumbar spine.  2. Multilevel endplate and facet degenerative changes.  3. If there are concerns regarding disc pathology, CT or MRI are  available for further evaluation.    Elizebeth Koller, MD   04/30/2016 4:20 PM        .    Data Review     Laboratory results reviewed by ED provider:  Yes  Radiologic study results reviewed by ED provider:  Yes    MDM and Clinical Notes           Reeval @ 1710:  Pt feeling better after IV analgesics.  Ambulating and moving freely - steady and no signficant pain.  Labs unremarkable.  Pt does not have pancreatitis.  Doubt disc herniation, epidural abscess.  More likely MSK pain, such as thoracic facet syndrome.  No pathologic vertebral compression fractures.  There is some DJD    Reeval @ 1750:  This patient presents with back pain that is likely musculoskeletal in origin. Based on my history and examination, the differential diagnoses of cauda equina syndrome, infection, pancreatitis, dissection trauma, malignancy, AAA, kidney  stones, have been considered and rejected. No serious etiologies of back pain are discernible at this time. The patient's symptoms will be managed with symptomatic care. I advised the patient to return to the emergency department immediately, should they develop worsening pain, fever, weakness, bowel or bladder symptoms, or any concerns.  Pt's pain has improved while in ED.  Ambulates without assistance.  HR normalized.        Critical Care     Critical Care: No critical care time needed      Diagnosis and Disposition     Rendering Provider: Alexander-Nicholas D Paola Flynt    Clinical Impression(s):  1. Thoracic myofascial strain, initial encounter    2. Thoracic facet syndrome        Disposition  ED Disposition     Discharge Maureen Vasquez discharge to home/self care.    Condition at disposition: Stable            Prescriptions    Discharge Medication List as of 04/30/2016  5:45 PM      START taking these medications    Details   HYDROcodone-acetaminophen (NORCO) 5-325 MG per tablet Take 1 tablet by mouth every 8 (eight) hours as needed., Starting 04/30/2016, Until Discontinued, Print      lidocaine (LIDODERM) 5 % Place 1 patch onto the skin daily as needed. Remove & Discard patch within 12 hours or as directed by MD, Starting 04/30/2016, Until Discontinued, Print      ondansetron (ZOFRAN-ODT) 4 MG disintegrating tablet Take 1 tablet (4 mg total) by mouth every 8 (eight) hours as needed for Nausea., Starting 04/30/2016, Until Discontinued, Print                     Carman Auxier, Alexander-Nicholas D, MD  04/30/16 1753    Ivon Roedel, Alexander-Nicholas D, MD  04/30/16 1806

## 2016-04-30 NOTE — Discharge Instructions (Signed)
Ibuprofen 600mg  every 8 hours as needed for pain (take with food).  Lidoderm patch over painful area for 12 hours of the day then remove.  Norco for severe pain only at night.    Thoracic Strain    You have strained your thoracic spine.    The thoracic spine is also called the upper or middle back.    A strain happens when a muscle is stretched, torn or injured. The pain that you feel is caused by inflammation (swelling) or bruising in the muscle. A strain is not the same as a sprain. A sprain is an injury to a ligament that holds bones    A thoracic strain occurs when twisting, bending or lifting causes the muscles of the middle back to stretch. Some of the muscle fibers tear leading to stiffness and pain. It is common to experience pain over the muscles around the middle spine but not over the bones themselves. A back strain should not be confused with a more serious low-back condition called a herniated disk (slipped disk).    The x-rays of your back showed no evidence of broken bones.    Try these ideas to ease the pain in your lower back:   Apply a warm moist heat towel to the back for 20 minutes at a time, at least 4 times per day.   Gently massage the injured muscles to relax them and ease the pain.   Avoid any heavy lifting or bending. You can resume normal daily activities as long as they don t make the pain worsen.    YOU SHOULD SEEK MEDICAL ATTENTION IMMEDIATELY, EITHER HERE OR AT THE NEAREST EMERGENCY DEPARTMENT, IF ANY OF THE FOLLOWING OCCURS:   You have numbness, tingling, or loss of feeling in the arms or legs.   You feel weakness in the arms or legs.   You can't control your bowels or bladder (you soil or wet yourself).   You have a severe increase in pain.   Your pain does not improve within 4 weeks or is bad enough to seriously limit your normal activities.    Thank you for choosing [facility] for your emergency care needs.  We strive to provide EXCELLENT care to you  and your family.      If you do not continue to improve or your condition worsens, please contact your doctor or return immediately to the Emergency Department.    ONSITE PHARMACY  Our full service onsite pharmacy is a 2 minute walk from the ER.  Open Mon to Fri from 8 am to 8 pm, Sat and Sun 9 am to 5 pm. Ask an ED staff member for directions.  We accept all major insurances and prices are competitive with major retailers.  Ask your provider to print your prescriptions down to the pharmacy to speed you on your way home.      DOCTOR REFERRALS  Call [phone] (available 24 hours a day, 7 days a week) if you need any further referrals and we can help you find a primary care doctor or specialist.  Also, available online at:  [email or web address]    YOUR CONTACT INFORMATION  Before leaving please check with registration to make sure we have an up-to-date contact number.  You can call registration at [phone] to update your information.  For questions about your hospital bill, please call [phone].  For questions about your Emergency Dept Physician bill please call [phone].  FREE HEALTH SERVICES  If you need help with health or social services, please call 2-1-1 for a free referral to resources in your area.  2-1-1 is a free service connecting people with information on health insurance, free clinics, pregnancy, mental health, dental care, food assistance, housing, and substance abuse counseling.  Also, available online at:  http://www.[facility].org    MEDICAL RECORDS AND TESTS  Certain laboratory test results do not come back the same day, for example urine cultures.   We will contact you if other important findings are noted.  Radiology films are often reviewed again to ensure accuracy.  If there is any discrepancy, we will notify you.      Please call [phone] to pick up a complimentary CD of any radiology studies performed.  If you or your doctor would like to  request a copy of your medical records, please call [phone].          ORTHOPEDIC INJURY   Please know that significant injuries can exist even when an initial x-ray is read as normal or negative.  This can occur because some fractures (broken bones) are not initially visible on x-rays.  For this reason, close outpatient follow-up with your primary care doctor or bone specialist (orthopedist) is required.    MEDICATIONS AND FOLLOWUP  Please be aware that some prescription medications can cause drowsiness.  Use caution when driving or operating machinery.    The examination and treatment you have received in our Emergency Department is provided on an emergency basis, and is not intended to be a substitute for your primary care physician.  It is important that your doctor checks you again and that you report any new or remaining problems at that time.      24 HOUR PHARMACIES  [facility] - [address] [place], [place] [address] (1.4 miles, 7 minutes)  [facility] - [address] [place], [place] [address] (6.5 miles, 13 minutes)  Handout with directions available on request      ASSISTANCE WITH INSURANCE    Affordable Care Act  [facility])  Call to start or finish an application, compare plans, enroll or ask a question.  [phone]  TTY: [phone]  Web:  Healthcare.gov    Help Enrolling in [facility]  [employer] [place]  (430)355-9460 (TOLL-FREE)  971-195-1059 (TTY)  Web:  Http://www.coverva.org    Local Help Enrolling in the [facility]  [place] [employer]  [phone] (MAIN)  Email:  health-help@nvfs .org  Web:  [employer]is  Address:  [address], [address] [place], [place] [address]

## 2016-05-04 ENCOUNTER — Encounter: Payer: Self-pay | Admitting: Nurse Practitioner

## 2016-05-04 ENCOUNTER — Ambulatory Visit (INDEPENDENT_AMBULATORY_CARE_PROVIDER_SITE_OTHER): Payer: Medicare Other | Admitting: Nurse Practitioner

## 2016-05-04 VITALS — BP 108/60 | HR 62 | Ht 65.5 in | Wt 158.2 lb

## 2016-05-04 DIAGNOSIS — G301 Alzheimer's disease with late onset: Secondary | ICD-10-CM | POA: Diagnosis not present

## 2016-05-04 DIAGNOSIS — G250 Essential tremor: Secondary | ICD-10-CM

## 2016-05-04 DIAGNOSIS — F0280 Dementia in other diseases classified elsewhere without behavioral disturbance: Secondary | ICD-10-CM

## 2016-05-04 NOTE — Progress Notes (Signed)
I have read the note, and I agree with the clinical assessment and plan.  WILLIS,CHARLES KEITH   

## 2016-05-04 NOTE — Patient Instructions (Signed)
Continue Namenda 10mg  twice daily Continue Aricept 10 mg daily  continue Topamax 25 mg 1 tablet daily Memory score is stable F/U in 6 months

## 2016-05-04 NOTE — Progress Notes (Signed)
[facility]  PATIENT: Nicole [name] "Nicole [name]" Nicole [name] DOB: October 05, 1934   REASON FOR VISIT: Follow-up for memory loss and essential tremor HISTORY FROM: Patient and daughter    HISTORY OF PRESENT ILLNESS: UPDATE 05/04/16 CMMs. Nicole [name] 82 year old female returns for follow-up for her progressive memory disturbance. Since last seen she has moved to an assisted living [facility]. She is now more involved in activities, she goes to an exercise class several times a week and different outings. Yesterday she went fishing. She was placed on Namzaric at her last visit with Dr. Anne [name] however the retirement center has her on Aricept 10 mg daily and Namenda 10 mg twice a day. I called to verify this and is due to a cost savings. She feels her memory is stable. She is also on Topamax for tremor of her head and jaw. She no longer drives a motor vehicle. Family is now doing her finances. She has gained a few pounds she is eating more regularly. No new medical issues   11/04/15 Nicole [name]. Nicole [name] is a 82 year old right-handed white female with a history of a progressive memory disturbance. The patient is currently living alone at home, her husband died on 02-Nov-2015. The patient is having some increased anxiety issues and depression. With this, her head and neck and jaw tremor have worsened. The patient is on Aricept and Namenda, she is tolerating these medications well. She has been placed on diazepam to treat her tremor and anxiety. The patient is also on mirtazapine. She still is operating a motor vehicle without difficulty. She does her finances, but her family does look in on her, and she has an Nicole [name] that comes in daily to help her. She denies any weight loss since last seen. She indicates that she does not have tremors involving the arms. No other new medical issues that have come up since last seen.   REVIEW OF SYSTEMS: Full 14 system review of systems performed and notable only for  those listed, all others are neg:  Constitutional: neg  Cardiovascular: neg Ear/Nose/Throat: neg  Skin: neg Eyes: neg Respiratory: neg Gastroitestinal: neg  Hematology/Lymphatic: neg  Endocrine: neg Musculoskeletal:neg Allergy/Immunology: neg Neurological: Memory loss, head tremor Psychiatric: neg Sleep : neg   ALLERGIES: Allergies  Allergen Reactions  . Tramadol Nausea And Vomiting  . Paxil [Paroxetine Hcl]     Makes tremors worse    HOME MEDICATIONS: Outpatient Prescriptions Prior to Visit  Medication Sig Dispense Refill  . acetaminophen (TYLENOL) 500 MG tablet Take 1,000 mg by mouth 2 (two) times daily as needed for moderate pain.    Marland Kitchen aspirin 81 MG tablet Take 81 mg by mouth daily.    . busPIRone (BUSPAR) 15 MG tablet Take 15 mg by mouth 3 (three) times daily.    . fluticasone (FLONASE) 50 MCG/ACT nasal spray Place 2 sprays into the nose 2 (two) times daily. 1 g 2  . levothyroxine (SYNTHROID, LEVOTHROID) 50 MCG tablet Take 50 mcg by mouth daily before breakfast.     . mirtazapine (REMERON) 15 MG tablet Full tab at hs 90 tablet 0  . topiramate (TOPAMAX) 25 MG tablet Take 1 tablet (25 mg total) by mouth daily. 90 tablet 1  . trimethoprim (TRIMPEX) 100 MG tablet Take 100 mg by mouth at bedtime.    . Vitamin D, Ergocalciferol, (DRISDOL) 50000 UNITS CAPS Take 1 capsule by mouth every Monday.     . diazepam (VALIUM) 2 MG tablet Take 2 mg by mouth every 6 (six) hours  as needed for anxiety.    . Memantine HCl-Donepezil HCl (NAMZARIC) 28-10 MG CP24 Take 1 tablet by mouth daily. (Patient not taking: Reported on 05/04/2016) 90 capsule 1   No facility-administered medications prior to visit.    PAST MEDICAL HISTORY: Past Medical History  Diagnosis Date  . Essential and other specified forms of tremor   . Esophageal reflux   . Insomnia, unspecified   . Allergic rhinitis, cause unspecified   . Acute bronchitis   . Memory deficit 09/22/2014  . Alzheimer's disease 12/09/2015     PAST SURGICAL HISTORY: Past Surgical History  Procedure Laterality Date  . Total abdominal hysterectomy w/ bilateral salpingoophorectomy    . Bladder tack    . Colon polyps    . Cataract surgery Bilateral sept and october 2015    FAMILY HISTORY: Family History  Problem Relation Age of Onset  . Allergies Brother   . Allergies Sister   . Cancer Father   . Tremor Father   . Other Mother     fracture hip age 38  . Cancer Brother   . Tremor Brother   . Cancer Brother   . Heart disease Brother   . Heart disease Brother     SOCIAL HISTORY: Social History   Social History  . Marital Status: Married    Spouse Name: N/A  . Number of Children: 2  . Years of Education: HS   Occupational History  . 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 Smoking status: Never Smoker   . Smokeless tobacco: Never Used  . Alcohol Use: No  . Drug Use: No  . Sexual Activity: Not on file   Other Topics Concern  . Not on file   Social History Narrative   Patient drinks caffeine occasionally.   Patient is right handed.      PHYSICAL EXAM  Filed Vitals:   05/04/16 1439  BP: 108/60  Pulse: 62  Height: 5' 5.5" (1.664 m)  Weight: 158 lb 3.2 oz (71.759 kg)   Body mass index is 25.92 kg/(m^2). General: The patient is alert and cooperative at the time of the examination. Skin: No significant peripheral edema is noted.   Neurologic Exam Mental status: The patient is alert not oriented to date or year. . The Mini-Mental Status Examination done today shows a total score 20/30. She missed 3/3 recall. The patient is able to name 7animals in 30 seconds. Last MMSE 20/30.  Cranial nerves: Facial symmetry is present. Speech is normal, no aphasia or dysarthria is noted. Extraocular movements are full, with exception of some restriction of superior gaze. Visual fields are full. The patient has a side-to-side head tremor, and a jaw tremor. There is no associated  vocal tremor. Motor: The patient has good strength in all 4 extremities. Sensory examination: Soft touch sensation is symmetric on the face, arms, and legs. Coordination: The patient has good finger-nose-finger and heel-to-shin bilaterally. Gait and station: The patient has a normal gait. Tandem gait is normal. Romberg is negative. No drift is seen. No assistive device Reflexes: Deep tendon reflexes are symmetric upper and lower DIAGNOSTIC DATA (LABS, IMAGING, TESTING) -  ASSESSMENT AND PLAN  82 y.o. year old female  has a past medical history of Essential and other specified forms of tremor;  Insomnia, unspecified;  Memory deficit (09/22/2014); and Alzheimer's disease (12/09/2015). here to follow-up. She is now living in a retirement Center  Continue Namenda 10mg  twice daily Continue Aricept 10 mg  daily  continue Topamax 25 mg 1 tablet daily Memory score is stable F/U in 6 months Nilda RiggsNancy Carolyn Doyce Saling, [facility] CenterGNP, [facility]ollaBC, APRN  [facility] [address], [address] [place], [place] [address] [phone]

## 2016-11-21 ENCOUNTER — Ambulatory Visit (INDEPENDENT_AMBULATORY_CARE_PROVIDER_SITE_OTHER): Payer: Medicare Other | Admitting: Nurse Practitioner

## 2016-11-21 ENCOUNTER — Encounter: Payer: Self-pay | Admitting: Nurse Practitioner

## 2016-11-21 VITALS — BP 114/64 | HR 69 | Ht 65.5 in | Wt 168.6 lb

## 2016-11-21 DIAGNOSIS — R413 Other amnesia: Secondary | ICD-10-CM

## 2016-11-21 DIAGNOSIS — G301 Alzheimer's disease with late onset: Secondary | ICD-10-CM | POA: Diagnosis not present

## 2016-11-21 DIAGNOSIS — F0280 Dementia in other diseases classified elsewhere without behavioral disturbance: Secondary | ICD-10-CM | POA: Diagnosis not present

## 2016-11-21 DIAGNOSIS — G250 Essential tremor: Secondary | ICD-10-CM

## 2016-11-21 NOTE — Progress Notes (Signed)
[facility]  PATIENT: Nicole [name] DOB: [birth year]   REASON FOR VISIT: Follow-up for memory loss and essential tremor HISTORY FROM: Patient and daughter Nicole [name]    HISTORY OF PRESENT ILLNESS: UPDATE 01/08/2018CM Nicole [name], 83 year old female returns for follow-up with her daughter. She has a history of progressive memory disturbance. She is now living at [facility] assisted living and has tolerated the transition well. She claims she is very involved in activities and outside outings. She has participated in some drawing and art work .She is making friends. She remains on Namenda and Aricept. Due to cost savings [facility]  did not keep her on Namzaric. There have been no behavior issues. Appetite is good and she is sleeping well. She was placed on Topamax for tremor of her head and jaw however the daughter is not sure if they are still giving her the Topamax. No information came with the patient today. She returns for reevaluation  UPDATE 05/04/16 Nicole [name]. [name] 83 year old female returns for follow-up for her progressive memory disturbance. Since last seen she has moved to an assisted living [facility]. She is now more involved in activities, she goes to an exercise class several times a week and different outings. Yesterday she went fishing. She was placed on Namzaric at her last visit with Dr. Anne [name] however the retirement center has her on Aricept 10 mg daily and Namenda 10 mg twice a day. I called to verify this and is due to a cost savings. She feels her memory is stable. She is also on Topamax for tremor of her head and jaw. She no longer drives a motor vehicle. Family is now doing her finances. She has gained a few pounds she is eating more regularly. No new medical issues   11/04/15 Nicole [name]. Nicole [name] is a 83 year old right-handed white female with a history of a progressive memory disturbance. The patient is currently living alone at home, her  husband died on November 09, 2015. The patient is having some increased anxiety issues and depression. With this, her head and neck and jaw tremor have worsened. The patient is on Aricept and Namenda, she is tolerating these medications well. She has been placed on diazepam to treat her tremor and anxiety. The patient is also on mirtazapine. She still is operating a motor vehicle without difficulty. She does her finances, but her family does look in on her, and she has an Geophysicist/field seismologist that comes in daily to help her. She denies any weight loss since last seen. She indicates that she does not have tremors involving the arms. No other new medical issues that have come up since last seen.   REVIEW OF SYSTEMS: Full 14 system review of systems performed and notable only for those listed, all others are neg:  Constitutional: neg  Cardiovascular: neg Ear/Nose/Throat: neg  Skin: neg Eyes: neg Respiratory: neg Gastroitestinal: neg  Hematology/Lymphatic: neg  Endocrine: neg Musculoskeletal:neg Allergy/Immunology: neg Neurological: Memory loss, head tremor Psychiatric: Anxiety Sleep : neg   ALLERGIES: Allergies  Allergen Reactions  . Tramadol Nausea And Vomiting  . Paxil [Paroxetine Hcl]     Makes tremors worse    HOME MEDICATIONS: Outpatient Medications Prior to Visit  Medication Sig Dispense Refill  . acetaminophen (TYLENOL) 500 MG tablet Take 1,000 mg by mouth 2 (two) times daily as needed for moderate pain.    Marland Kitchen aspirin 81 MG tablet Take 81 mg by mouth daily.    . busPIRone (BUSPAR) 15 MG tablet Take 15 mg by  mouth 3 (three) times daily.    . Calcium Citrate-Vitamin D (CITRACAL PETITES/VITAMIN D PO) Take by mouth. 2 tabs by mouth once daily    . cholecalciferol (VITAMIN D) 1000 units tablet Take 1,000 Units by mouth daily.    . Cranberry 425 MG CAPS Take by mouth. Takes one cap daily    . docusate sodium (COLACE) 100 MG capsule Take 100 mg by mouth daily.    Marland Kitchen donepezil (ARICEPT) 10 MG tablet  Take 10 mg by mouth at bedtime.     . fexofenadine (ALLEGRA) 180 MG tablet Take 180 mg by mouth daily.    Marland Kitchen levothyroxine (SYNTHROID, LEVOTHROID) 50 MCG tablet Take 50 mcg by mouth daily before breakfast.     . LORazepam (ATIVAN) 0.5 MG tablet Take 0.5 mg by mouth at bedtime.    . Melatonin 3 MG CAPS Take by mouth. 2 caps po qhs    . memantine (NAMENDA) 10 MG tablet Take 10 mg by mouth 2 (two) times daily.     . mirtazapine (REMERON) 15 MG tablet Full tab at hs 90 tablet 0  . PATADAY 0.2 % SOLN Using both eyes    . polyethylene glycol powder (GLYCOLAX/MIRALAX) powder Take 1 Container by mouth daily.     Marland Kitchen trimethoprim (TRIMPEX) 100 MG tablet Take 100 mg by mouth at bedtime.    . topiramate (TOPAMAX) 25 MG tablet Take 1 tablet (25 mg total) by mouth daily. 90 tablet 1  . fluticasone (FLONASE) 50 MCG/ACT nasal spray Place 2 sprays into the nose 2 (two) times daily. (Patient not taking: Reported on 11/21/2016) 1 g 2   No facility-administered medications prior to visit.     PAST MEDICAL HISTORY: Past Medical History:  Diagnosis Date  . Acute bronchitis   . Allergic rhinitis, cause unspecified   . Alzheimer's disease 12/09/2015  . Esophageal reflux   . Essential and other specified forms of tremor   . Insomnia, unspecified   . Memory deficit 09/22/2014    PAST SURGICAL HISTORY: Past Surgical History:  Procedure Laterality Date  . bladder tack    . cataract surgery Bilateral sept and october 2015  . colon polyps    . TOTAL ABDOMINAL HYSTERECTOMY W/ BILATERAL SALPINGOOPHORECTOMY      FAMILY HISTORY: Family History  Problem Relation Age of Onset  . Allergies Brother   . Allergies Sister   . Cancer Father   . Tremor Father   . Other Mother     fracture hip age 33  . Cancer Brother   . Tremor Brother   . Cancer Brother   . Heart disease Brother   . Heart disease Brother     SOCIAL HISTORY: Social History   Social History  . Marital status: Married    Spouse name: N/A  .  Number of children: 2  . Years of education: HS   Occupational History  . 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 Smoking status: Never Smoker  . Smokeless tobacco: Never Used  . Alcohol use No  . Drug use: No  . Sexual activity: Not on file   Other Topics Concern  . Not on file   Social History Narrative   Patient drinks caffeine occasionally.   Patient is right handed.    Lives at [facility], memory care.       PHYSICAL EXAM  Vitals:   11/21/16 1520  BP: 114/64  Pulse: 69  Weight: 168 lb 9.6  oz (76.5 kg)  Height: 5' 5.5" (1.664 Nicole)   Body mass index is 27.63 kg/Nicole. General: The patient is alert and cooperative at the time of the examination. Skin: No significant peripheral edema is noted.   Neurologic Exam Mental status: The patient is alert not oriented to date or year. . The Mini-Mental Status Examination done today shows a total score 20/30. She missed 3/3 recall. The patient is able to name 5animals in 30 seconds. Last MMSE 20/30.  Cranial nerves: Facial symmetry is present. Speech is normal, no aphasia or dysarthria is noted. Extraocular movements are full, with exception of some restriction of superior gaze. Visual fields are full. The patient has a side-to-side head tremor, and a jaw tremor. There is no associated vocal tremor. Motor: The patient has good strength in all 4 extremities. Sensory examination: Soft touch sensation is symmetric on the face, arms, and legs. Coordination: The patient has good finger-nose-finger and heel-to-shin bilaterally. Gait and station: The patient has a normal gait. Tandem gait is normal. Romberg is negative. No drift is seen. No assistive device Reflexes: Deep tendon reflexes are symmetric upper and lower DIAGNOSTIC DATA (LABS, IMAGING, TESTING) -  ASSESSMENT AND PLAN  83 y.o. year old female  has a past medical history of Essential and other specified forms of tremor;  Insomnia,  unspecified;  Memory deficit (09/22/2014); and Alzheimer's disease (12/09/2015). here to follow-up. She is now living in a retirement Center  PLAN: Continue Namenda 10mg  twice daily Continue Aricept 10 mg daily  Daughter to check to see if patient is on Topamax 25 mg 1 tablet daily,and call us with the information.   Memory score is stable F/U in 6 months, next with Dr. Woody SellerWillis Emillio Ngo Carolyn Kayshawn Ozburn, [facility]ennialGNP, [facility] CenterBC, APRN  [facility] [address], [address] [place], [place] [address] [phone]

## 2016-11-21 NOTE — Progress Notes (Signed)
I have read the note, and I agree with the clinical assessment and plan.  WILLIS,CHARLES KEITH   

## 2016-11-21 NOTE — Patient Instructions (Signed)
Continue Namenda 10mg  twice daily Continue Aricept 10 mg daily  Check to see if patient is on Topamax 25 mg 1 tablet daily,  Memory score is stable F/U in 6 months, next with Dr. Anne HahnWillis

## 2017-01-14 ENCOUNTER — Other Ambulatory Visit: Admission: RE | Admit: 2017-01-14 | Payer: Medicare Other | Admitting: *Deleted

## 2017-01-14 ENCOUNTER — Other Ambulatory Visit
Admission: RE | Admit: 2017-01-14 | Discharge: 2017-01-14 | Disposition: A | Discharge status: Home / Self Care | Payer: Medicare Other | Source: Ambulatory Visit | Attending: Family Medicine | Admitting: Family Medicine

## 2017-01-14 ENCOUNTER — Other Ambulatory Visit
Admission: RE | Admit: 2017-01-14 | Discharge: 2017-01-14 | Disposition: A | Discharge status: Home / Self Care | Payer: Self-pay | Source: Other Acute Inpatient Hospital | Attending: Internal Medicine | Admitting: Internal Medicine

## 2017-01-14 DIAGNOSIS — N390 Urinary tract infection, site not specified: Secondary | ICD-10-CM | POA: Insufficient documentation

## 2017-01-14 LAB — URINALYSIS, COMPLETE (UACMP) WITH MICROSCOPIC
Bilirubin Urine: NEGATIVE
Glucose, UA: NEGATIVE mg/dL
HGB URINE DIPSTICK: NEGATIVE
Ketones, ur: NEGATIVE mg/dL
Nitrite: POSITIVE — AB
Protein, ur: NEGATIVE mg/dL
SPECIFIC GRAVITY, URINE: 1.018 (ref 1.005–1.030)
pH: 6 (ref 5.0–8.0)

## 2017-01-17 LAB — URINE CULTURE

## 2017-05-09 ENCOUNTER — Telehealth: Payer: Self-pay | Admitting: Neurology

## 2017-05-09 NOTE — Telephone Encounter (Signed)
I called the daughter. The patient has had several days of increased confusion and vivid dreams at night, nightmares. The Attending Dr. is checking urinalysis to make sure she is not suffering from a bladder infection. If this is not the case, we may consider discontinuance of the Topamax. The patient will be seen in a few weeks due to this office. There has been a recent increase in the BuSpar dose from 45 mg to 60 mg daily. The daughter's questioning whether this could be the etiology of her confusion, but the patient has had a significant worsening of her anxiety.

## 2017-05-09 NOTE — Telephone Encounter (Signed)
I called the daughter, left a message, I will call back later. 

## 2017-05-09 NOTE — Telephone Encounter (Signed)
Patients daughter called office in reference to patients busPIRone (BUSPAR) 15 MG tablet being increased at the facility on June 6th due to anxiety.  Per daughter patient has been having nightmares, not knowing where she is, pt states she feels crazy.  Steward DroneBrenda does not know of the strength of the medication and advised her to contact facility to gather information.  Please call

## 2017-05-23 ENCOUNTER — Ambulatory Visit (INDEPENDENT_AMBULATORY_CARE_PROVIDER_SITE_OTHER): Payer: Medicare Other | Admitting: Neurology

## 2017-05-23 ENCOUNTER — Encounter: Payer: Self-pay | Admitting: Neurology

## 2017-05-23 VITALS — BP 126/76 | HR 79 | Ht 65.5 in | Wt 173.5 lb

## 2017-05-23 DIAGNOSIS — G250 Essential tremor: Secondary | ICD-10-CM

## 2017-05-23 DIAGNOSIS — G301 Alzheimer's disease with late onset: Secondary | ICD-10-CM | POA: Diagnosis not present

## 2017-05-23 DIAGNOSIS — F0280 Dementia in other diseases classified elsewhere without behavioral disturbance: Secondary | ICD-10-CM | POA: Diagnosis not present

## 2017-05-23 NOTE — Progress Notes (Signed)
Reason for visit: Alzheimer's disease, tremor  Nicole [name] is an 83 y.o. female  History of present illness:  Nicole [name] is an 83 year old right-handed white female with a history of a progressive dementing illness and a history of tremor that mainly affects the jaw. The patient recent had an episode of significantly increased confusion, she was found have urinary tract infection and she currently is on Cipro. The patient has improved to her usual baseline. The patient does have some problems with anxiety, currently she is sleeping fairly well. She takes Remeron and BuSpar for anxiety. She is on Topamax for her tremor in low dose. She has recently been placed on clonazepam, she was taken off of Ativan. She returns for an evaluation.  Past Medical History:  Diagnosis Date  . Acute bronchitis   . Allergic rhinitis, cause unspecified   . Alzheimer's disease 12/09/2015  . Esophageal reflux   . Essential and other specified forms of tremor   . Insomnia, unspecified   . Memory deficit 09/22/2014    Past Surgical History:  Procedure Laterality Date  . bladder tack    . cataract surgery Bilateral sept and october 2015  . colon polyps    . TOTAL ABDOMINAL HYSTERECTOMY W/ BILATERAL SALPINGOOPHORECTOMY      Family History  Problem Relation Age of Onset  . Allergies Brother   . Allergies Sister   . Cancer Father   . Tremor Father   . Other Mother        fracture hip age 81  . Cancer Brother   . Tremor Brother   . Cancer Brother   . Heart disease Brother   . Heart disease Brother     Social history:  reports that she has never smoked. She has never used smokeless tobacco. She reports that she does not drink alcohol or use drugs.    Allergies  Allergen Reactions  . Tramadol Nausea And Vomiting  . Paxil [Paroxetine Hcl]     Makes tremors worse    Medications:  Prior to Admission medications   Medication Sig Start Date End Date Taking? Authorizing  Provider  acetaminophen (TYLENOL) 500 MG tablet Take 1,000 mg by mouth 2 (two) times daily as needed for moderate pain.   Yes [provider]  aspirin 81 MG tablet Take 81 mg by mouth daily.   Yes [provider]  busPIRone (BUSPAR) 15 MG tablet Take 30 mg by mouth 2 (two) times daily.    Yes [provider]  Calcium Citrate-Vitamin D (CITRACAL PETITES/VITAMIN D PO) Take by mouth. 2 tabs by mouth once daily   Yes [provider]  cholecalciferol (VITAMIN D) 1000 units tablet Take 1,000 Units by mouth daily.   Yes [provider]  ciprofloxacin (CIPRO) 250 MG tablet Take 250 mg by mouth 2 (two) times daily.   Yes [provider]  clonazePAM (KLONOPIN) 0.5 MG tablet Take 0.5 mg by mouth 2 (two) times daily.   Yes [provider]  Cranberry 425 MG CAPS Take by mouth. Takes one cap daily   Yes [provider]  cycloSPORINE (RESTASIS) 0.05 % ophthalmic emulsion Place 1 drop into both eyes 2 (two) times daily.   Yes [provider]  dextromethorphan 15 MG/5ML syrup Take 10 mLs by mouth 2 (two) times daily.   Yes [provider]  docusate sodium (COLACE) 100 MG capsule Take 100 mg by mouth daily as needed.    Yes [provider]  donepezil (ARICEPT) 10 MG tablet Take 10 mg by mouth at bedtime.  04/12/16  Yes [provider]  fexofenadine (ALLEGRA) 180 MG tablet Take 180 mg by mouth as needed.    Yes [provider]  levothyroxine (SYNTHROID, LEVOTHROID) 50 MCG tablet Take 50 mcg by mouth daily before breakfast.  02/18/15  Yes [provider]  LORazepam (ATIVAN) 0.5 MG tablet Take 0.5 mg by mouth as needed.    Yes [provider]  Melatonin 3 MG CAPS Take by mouth. 2 caps po qhs   Yes [provider]  memantine (NAMENDA) 10 MG tablet Take 10 mg by mouth 2 (two) times daily.  04/27/16  Yes [provider]  mirtazapine (REMERON) 15 MG tablet Full tab at hs  10/18/15  Yes [name], MD  Polyethyl Glycol-Propyl Glycol (SYSTANE OP) Place 1 drop into both eyes 2 (two) times daily.   Yes [provider]  polyethylene glycol powder (GLYCOLAX/MIRALAX) powder Take 1 Container by mouth as needed.  02/10/16  Yes [provider]  topiramate (TOPAMAX) 25 MG tablet Take 1 tablet (25 mg total) by mouth daily. 11/05/15  Yes [name], MD    ROS:  Out of a complete 14 system review of symptoms, the patient complains only of the following symptoms, and all other reviewed systems are negative.  Tremor Memory disturbance  Blood pressure 126/76, pulse 79, height 5' 5.5" (1.664 m), weight 173 lb 8 oz (78.7 kg).  Physical Exam  General: The patient is alert and cooperative at the time of the examination.  Skin: No significant peripheral edema is noted.   Neurologic Exam  Mental status: The patient is alert and oriented x 2 at the time of the examination (not oriented to date). The Mini-Mental Status Examination done today shows a total score of 18/30..   Cranial nerves: Facial symmetry is present. Speech is normal, no aphasia or dysarthria is noted. Extraocular movements are restricted in a horizontal and vertical plane, the patient has difficulty tracking objects.Jill Alexanders fields are full. A jaw tremor is noted.  Motor: The patient has good strength in all 4 extremities.  Sensory examination: Soft touch sensation is symmetric on the face, arms, and legs.  Coordination: The patient has good finger-nose-finger and heel-to-shin bilaterally.  Gait and station: The patient has the ability to ambulate independently. Patient has some decreased arm swing with the left arm as compared to the right. Posture is somewhat stooped. Tandem gait was not attempted. Romberg is negative. No drift is seen.  Reflexes: Deep tendon reflexes are symmetric.   Assessment/Plan:  1. Essential tremor  2. Progressive memory disturbance  The  patient does have some features of parkinsonism with masking of the face and some restriction of eye movements. The patient recently had a urinary tract infection with a significant worsening of confusion, she has returned to her baseline with antibiotic therapy. The patient will be taken off of Topamax, she will follow-up in 6 months.  Marlan Palau MD 05/23/2017 4:19 PM  [facility] [address] [place], [place] [address]  Phone [phone] Fax [phone]

## 2017-05-23 NOTE — Patient Instructions (Signed)
Stop the Topamax. 

## 2017-06-06 ENCOUNTER — Encounter: Payer: Self-pay | Admitting: Neurology

## 2017-06-06 ENCOUNTER — Telehealth: Payer: Self-pay | Admitting: Neurology

## 2017-06-06 NOTE — Telephone Encounter (Signed)
I called the daughter. I reviewed the information with medications. The patient has been confused with good days and bad days. The patient does not have a urinary tract infection at this time.  The BuSpar was increased from 15 mg twice daily to 30 mg twice daily in June, we will back off on this in to 15 mg twice daily. The patient will remain on clonazepam for now.  The patient will remain off of Topamax.

## 2017-06-06 NOTE — Telephone Encounter (Signed)
Pt's daughter Steward DroneBrenda called the office needing advise on confusion and increased anxiety. She said UTI has been ruled out by urologist on Friday. She said Dr Anne HahnWillis advised her that it could be multiple medication changes and she wants to know how to approach possible medication induced confusion as discussed at last OV. She is going to send dates and medication changes via mychart. She can be reached after 2pm today.

## 2017-06-08 ENCOUNTER — Encounter: Payer: Self-pay | Admitting: Neurology

## 2017-06-09 NOTE — Telephone Encounter (Signed)
Tried faxing order to [phone]. Fax failed. Called [facility]. [facility] [phone]. Received confirmation.

## 2017-12-11 ENCOUNTER — Ambulatory Visit (INDEPENDENT_AMBULATORY_CARE_PROVIDER_SITE_OTHER): Payer: Medicare Other | Admitting: Neurology

## 2017-12-11 ENCOUNTER — Encounter: Payer: Self-pay | Admitting: Neurology

## 2017-12-11 VITALS — BP 110/60 | HR 64 | Wt 172.5 lb

## 2017-12-11 DIAGNOSIS — G301 Alzheimer's disease with late onset: Secondary | ICD-10-CM | POA: Diagnosis not present

## 2017-12-11 DIAGNOSIS — F0280 Dementia in other diseases classified elsewhere without behavioral disturbance: Secondary | ICD-10-CM

## 2017-12-11 MED ORDER — MIRTAZAPINE 15 MG PO TABS: 30.0000 mg | ORAL_TABLET | Freq: Every day | ORAL | 0 refills | Status: AC

## 2017-12-11 NOTE — Progress Notes (Signed)
Reason for visit: Memory disturbance  Nicole [name] "Nicole [name]" Nicole NicksW Nicole [name] is an 84 y.o. female  History of present illness:  Ms. Nicole [name] is an 84 year old right-handed white female with a history of a progressive memory disturbance consistent with Alzheimer's disease.  The patient also has a jaw tremor, masking of the face and some features of parkinsonism.  She has no tremor involving the arms.  The patient has had ongoing issues with her memory, [facility] is at [facility], she does seem to interact with others socially.  She continues to have significant issues with anxiety, when the anxiety gets severe she becomes quite confused.  She is having frequent bladder infections as well.  The patient currently is on Ativan taking 0.5 mg 3 times daily.  She is off clonazepam.  She is on BuSpar taking 15 mg twice daily, and she is on mirtazapine 15 mg at night.  The patient comes into the office today with her daughter.  Past Medical History:  Diagnosis Date  . Acute bronchitis   . Allergic rhinitis, cause unspecified   . Alzheimer's disease 12/09/2015  . Esophageal reflux   . Essential and other specified forms of tremor   . Insomnia, unspecified   . Memory deficit 09/22/2014    Past Surgical History:  Procedure Laterality Date  . bladder tack    . cataract surgery Bilateral sept and october 2015  . colon polyps    . TOTAL ABDOMINAL HYSTERECTOMY W/ BILATERAL SALPINGOOPHORECTOMY      Family History  Problem Relation Age of Onset  . Allergies Brother   . Allergies Sister   . Cancer Father   . Tremor Father   . Other Mother        fracture hip age 82  . Cancer Brother   . Tremor Brother   . Cancer Brother   . Heart disease Brother   . Heart disease Brother     Social history:  reports that  has never smoked. she has never used smokeless tobacco. She reports that she does not drink alcohol or use drugs.    Allergies  Allergen Reactions  . Tramadol Nausea And Vomiting  . Paxil  [Paroxetine Hcl]     Makes tremors worse    Medications:  Prior to Admission medications   Medication Sig Start Date End Date Taking? Authorizing Provider  busPIRone (BUSPAR) 15 MG tablet Take 30 mg by mouth 2 (two) times daily.    Yes [provider]  Calcium Citrate-Vitamin D (CITRACAL PETITES/VITAMIN D PO) Take 2,000 Units by mouth 2 (two) times daily. 2 tabs by mouth once daily    Yes [provider]  cephALEXin (KEFLEX) 250 MG capsule Take 250 mg by mouth 4 (four) times daily.   Yes [provider]  Cranberry 425 MG CAPS Take by mouth. Takes one cap daily   Yes [provider]  cycloSPORINE (RESTASIS) 0.05 % ophthalmic emulsion Place 1 drop into both eyes 2 (two) times daily.   Yes [provider]  docusate sodium (COLACE) 100 MG capsule Take 100 mg by mouth daily as needed.    Yes [provider]  donepezil (ARICEPT) 10 MG tablet Take 10 mg by mouth at bedtime.  04/12/16  Yes [provider]  fexofenadine (ALLEGRA) 180 MG tablet Take 90 mg by mouth as needed.    Yes [provider]  fluticasone (FLONASE) 50 MCG/ACT nasal spray Place 1 spray into both nostrils daily.   Yes [provider]  HYDROcodone-acetaminophen (NORCO/VICODIN) 5-325 MG tablet Take 0.5 tablets by mouth 2 (two) times daily as needed for moderate pain.   Yes [provider]  levothyroxine (SYNTHROID, LEVOTHROID) 75 MCG tablet Take 75 mcg by mouth daily before breakfast.   Yes [provider]  LORazepam (ATIVAN) 0.5 MG tablet Take 0.5 mg by mouth 3 (three) times daily as needed.    Yes [provider]  Melatonin 3 MG CAPS Take 5 mg by mouth daily. 2 caps po qhs    Yes [provider]  memantine (NAMENDA) 10 MG tablet Take 10 mg by mouth 2 (two) times daily.  04/27/16  Yes [provider]  mirtazapine (REMERON) 15 MG tablet Take 2 tablets (30 mg total) by mouth at bedtime. 12/11/17  Yes [name], MD  Polyethyl Glycol-Propyl Glycol (SYSTANE OP) Place 1 drop into both eyes 2 (two) times daily.   Yes [provider]  senna (SENOKOT) 8.6 MG TABS tablet Take 1 tablet by mouth.   Yes [provider]  cholecalciferol (VITAMIN D) 1000 units tablet Take 1,000 Units by mouth daily.    [provider]  dextromethorphan 15 MG/5ML syrup Take 10 mLs by mouth 2 (two) times daily.    [provider]    ROS:  Out of a complete 14 system review of symptoms, the patient complains only of the following symptoms, and all other reviewed systems are negative.  Runny nose Constipation Joint pain, walking difficulty Memory loss, headache, tremors Agitation, depression, anxiety  Blood pressure 110/60, pulse 64, weight 172 lb 8 oz (78.2 kg), SpO2 95 %.  Physical Exam  General: The patient is alert and cooperative at the time of the examination.   The patient is moderately obese.  Skin: No significant peripheral edema is noted.   Neurologic Exam  Mental status: The patient is alert and oriented x 2 at the time of the examination (not oriented to date). The Mini-Mental status examination done today shows a total score of 18/30.   Cranial nerves: Facial symmetry is present. Speech is normal, no aphasia or dysarthria is noted. Extraocular movements are full. Visual fields are full.  Masking of the face is seen.  The patient does have some restriction of superior gaze.  A jaw tremor is noted and a head nod tremor is noted.  Motor: The patient has good strength in all 4 extremities.  Sensory examination: Soft touch sensation is symmetric on the face, arms, and legs.  Coordination: The patient has good finger-nose-finger and heel-to-shin bilaterally.  Gait and station: The patient is able to walk independently.  Posture is somewhat stooped.  The patient has good turns.  The patient has bilateral arm swing.  Romberg is negative. No drift is seen.  Reflexes: Deep  tendon reflexes are symmetric.   Assessment/Plan:  1.  Progressive memory disturbance, Alzheimer's disease  2.  Jaw tremor, head tremor  3.  Parkinsonism  4.  Anxiety  The patient is having increased anxiety associated with confusion.  The mirtazapine dose will be increased to 30 mg at night, the family will watch for excessive drowsiness the next day.  The patient will continue on the Ativan.  She will follow-up in 6 months.  The patient remains on Aricept.  Marlan Palau MD 12/11/2017 4:54 PM  [facility] [address] [place], [place] [address]  Phone [phone] Fax [phone]

## 2017-12-11 NOTE — Patient Instructions (Signed)
   We will go up on the mirtazepine to 30 mg at night.

## 2018-06-28 ENCOUNTER — Ambulatory Visit (INDEPENDENT_AMBULATORY_CARE_PROVIDER_SITE_OTHER): Payer: Medicare Other | Admitting: Neurology

## 2018-06-28 ENCOUNTER — Encounter: Payer: Self-pay | Admitting: Neurology

## 2018-06-28 VITALS — BP 127/72 | HR 75 | Ht 63.0 in | Wt 179.0 lb

## 2018-06-28 DIAGNOSIS — G301 Alzheimer's disease with late onset: Secondary | ICD-10-CM

## 2018-06-28 DIAGNOSIS — F0280 Dementia in other diseases classified elsewhere without behavioral disturbance: Secondary | ICD-10-CM

## 2018-06-28 DIAGNOSIS — G250 Essential tremor: Secondary | ICD-10-CM | POA: Diagnosis not present

## 2018-06-28 MED ORDER — DONEPEZIL HCL 5 MG PO TABS: 5.0000 mg | ORAL_TABLET | Freq: Every day | ORAL | 1 refills | Status: AC

## 2018-06-28 MED ORDER — DONEPEZIL HCL 5 MG PO TABS: 5.0000 mg | ORAL_TABLET | Freq: Every day | ORAL | 1 refills | Status: DC

## 2018-06-28 NOTE — Patient Instructions (Signed)
We will reduce the Aricept dose to 5 mg at night.

## 2018-06-28 NOTE — Progress Notes (Signed)
Reason for visit: Memory disturbance  Nicole [name] is an 84 y.o. female  History of present illness:  Ms. Nicole [name] is an 84 year old right-handed white female with a history of a lifelong anxiety disorder and a progressive dementia.  The patient has tremors that involve the head and neck and jaw.  She has been placed on various medication for anxiety to include mirtazapine, BuSpar, Depakote, and Ativan.  She still has a lot of anxiety issues, she sleeps well at night and eats well.  The patient will call her daughter frequently on the telephone.  She complains of being lonely, any deviation from her regular schedule during the day will result in significant confusion.  The patient does have urinary frequency, some occasional incontinence.  She has stumbled on occasion, but with no falls.  She returns to the office today for an evaluation.  She remains on Aricept and Namenda.  Past Medical History:  Diagnosis Date  . Acute bronchitis   . Allergic rhinitis, cause unspecified   . Alzheimer's disease 12/09/2015  . Esophageal reflux   . Essential and other specified forms of tremor   . Insomnia, unspecified   . Memory deficit 09/22/2014    Past Surgical History:  Procedure Laterality Date  . bladder tack    . cataract surgery Bilateral sept and october 2015  . colon polyps    . TOTAL ABDOMINAL HYSTERECTOMY W/ BILATERAL SALPINGOOPHORECTOMY      Family History  Problem Relation Age of Onset  . Allergies Brother   . Allergies Sister   . Cancer Father   . Tremor Father   . Other Mother        fracture hip age 82  . Cancer Brother   . Tremor Brother   . Cancer Brother   . Heart disease Brother   . Heart disease Brother     Social history:  reports that she has never smoked. She has never used smokeless tobacco. She reports that she does not drink alcohol or use drugs.    Allergies  Allergen Reactions  . Tramadol Nausea And Vomiting  . Paxil [Paroxetine Hcl]     Makes  tremors worse    Medications:  Prior to Admission medications   Medication Sig Start Date End Date Taking? Authorizing Provider  acetaminophen (TYLENOL) 500 MG tablet Take 500 mg by mouth as needed.   Yes [provider]  busPIRone (BUSPAR) 15 MG tablet Take 30 mg by mouth 2 (two) times daily.    Yes [provider]  Calcium Citrate-Vitamin D (CITRACAL PETITES/VITAMIN D PO) Take 2,000 Units by mouth 2 (two) times daily. 2 tabs by mouth once daily    Yes [provider]  cholecalciferol (VITAMIN D) 1000 units tablet Take 1,000 Units by mouth daily.   Yes [provider]  Cranberry 425 MG CAPS Take by mouth. Takes one cap daily   Yes [provider]  cycloSPORINE (RESTASIS) 0.05 % ophthalmic emulsion Place 1 drop into both eyes 2 (two) times daily.   Yes [provider]  divalproex (DEPAKOTE SPRINKLE) 125 MG capsule Take 125 mg by mouth daily.   Yes [provider]  divalproex (DEPAKOTE) 250 MG DR tablet Take 250 mg by mouth 2 (two) times daily.   Yes [provider]  docusate sodium (COLACE) 100 MG capsule Take 100 mg by mouth daily as needed.    Yes [provider]  fexofenadine (ALLEGRA) 180 MG tablet Take 90 mg by mouth  as needed.    Yes [provider]  fluticasone (FLONASE) 50 MCG/ACT nasal spray Place 1 spray into both nostrils daily.   Yes [provider]  HYDROcodone-acetaminophen (NORCO/VICODIN) 5-325 MG tablet Take 0.5 tablets by mouth 2 (two) times daily as needed for moderate pain.   Yes [provider]  levothyroxine (SYNTHROID, LEVOTHROID) 75 MCG tablet Take 75 mcg by mouth daily before breakfast.   Yes [provider]  LORazepam (ATIVAN) 0.5 MG tablet Take 0.5 mg by mouth 3 (three) times daily as needed.    Yes [provider]  Melatonin 3 MG CAPS Take 5 mg by mouth daily. 2 caps po qhs    Yes [provider]  memantine (NAMENDA) 10 MG tablet Take  10 mg by mouth 2 (two) times daily.  04/27/16  Yes [provider]  mirtazapine (REMERON) 15 MG tablet Take 2 tablets (30 mg total) by mouth at bedtime. 12/11/17  Yes York SpanielWillis, Jayliah Benett K, MD  Polyethyl Glycol-Propyl Glycol (SYSTANE OP) Place 1 drop into both eyes 2 (two) times daily.   Yes [provider]  senna (SENOKOT) 8.6 MG TABS tablet Take 1 tablet by mouth.   Yes [provider]  cephALEXin (KEFLEX) 250 MG capsule Take 250 mg by mouth 4 (four) times daily.    [provider]  dextromethorphan 15 MG/5ML syrup Take 10 mLs by mouth 2 (two) times daily.    [provider]  donepezil (ARICEPT) 5 MG tablet Take 1 tablet (5 mg total) by mouth at bedtime. 06/28/18   York SpanielWillis, Nicole Heather K, MD    ROS:  Out of a complete 14 system review of symptoms, the patient complains only of the following symptoms, and all other reviewed systems are negative.  Runny nose Leg swelling Constipation Difficulty urinating Memory loss, tremors Depression, anxiety  Blood pressure 127/72, pulse 75, height 5\' 3"  (1.6 m), weight 179 lb (81.2 kg).  Physical Exam  General: The patient is alert and cooperative at the time of the examination.  The patient is moderately obese.  Skin: No significant peripheral edema is noted.   Neurologic Exam  Mental status: The patient is alert and oriented x 2 at the time of the examination (not oriented to date). The Mini-Mental status examination done today shows a total score 14/30.   Cranial nerves: Facial symmetry is present. Speech is normal, no aphasia or dysarthria is noted. Extraocular movements are full. Visual fields are full.  Masking of the face is seen.  A head and neck tremor is noted.  Motor: The patient has good strength in all 4 extremities.  Sensory examination: Soft touch sensation is symmetric on the face, arms, and legs.  Coordination: The patient has good finger-nose-finger and heel-to-shin bilaterally.  Gait  and station: The patient has stooped posture, the patient walk independently, she has fairly good stride and good turns.  Romberg is negative.  Reflexes: Deep tendon reflexes are symmetric.   Assessment/Plan:  1.  Progressive dementing illness, Alzheimer's disease  2.  Anxiety disorder  3.  Head and neck tremor  The patient is on 4 different medications to aid in mood stabilization and anxiety.  The patient continues to have significant issues in this regard.  The patient continues to progress with her memory, at times, Aricept may cause agitation.  We will cut back on the dose to 5 mg at night to see if this improves her behavior.  The patient will remain on Namenda and on the 30  mg dose of the mirtazapine.  She will follow-up here in 6 months.  Marlan Palau MD 06/28/2018 4:23 PM  [facility] [address] [place], [place] [address]  Phone [phone] Fax [phone]

## 2018-07-25 ENCOUNTER — Emergency Department (HOSPITAL_COMMUNITY)
Admission: EM | Admit: 2018-07-25 | Discharge: 2018-07-25 | Disposition: A | Discharge status: Home / Self Care | Payer: Medicare Other | Attending: Emergency Medicine | Admitting: Emergency Medicine

## 2018-07-25 ENCOUNTER — Encounter (HOSPITAL_COMMUNITY): Payer: Self-pay

## 2018-07-25 ENCOUNTER — Emergency Department (HOSPITAL_COMMUNITY): Payer: Medicare Other

## 2018-07-25 DIAGNOSIS — Z79899 Other long term (current) drug therapy: Secondary | ICD-10-CM | POA: Insufficient documentation

## 2018-07-25 DIAGNOSIS — R1012 Left upper quadrant pain: Secondary | ICD-10-CM | POA: Diagnosis present

## 2018-07-25 DIAGNOSIS — M25552 Pain in left hip: Secondary | ICD-10-CM | POA: Insufficient documentation

## 2018-07-25 DIAGNOSIS — R109 Unspecified abdominal pain: Secondary | ICD-10-CM

## 2018-07-25 DIAGNOSIS — G309 Alzheimer's disease, unspecified: Secondary | ICD-10-CM | POA: Diagnosis not present

## 2018-07-25 LAB — CBC WITH DIFFERENTIAL/PLATELET
Basophils Absolute: 0 10*3/uL (ref 0.0–0.1)
Basophils Relative: 0 %
Eosinophils Absolute: 0.2 10*3/uL (ref 0.0–0.7)
Eosinophils Relative: 2 %
HCT: 44.9 % (ref 36.0–46.0)
Hemoglobin: 14.6 g/dL (ref 12.0–15.0)
Lymphocytes Relative: 28 %
Lymphs Abs: 2.1 10*3/uL (ref 0.7–4.0)
MCH: 32.2 pg (ref 26.0–34.0)
MCHC: 32.5 g/dL (ref 30.0–36.0)
MCV: 98.9 fL (ref 78.0–100.0)
Monocytes Absolute: 0.8 10*3/uL (ref 0.1–1.0)
Monocytes Relative: 11 %
Neutro Abs: 4.4 10*3/uL (ref 1.7–7.7)
Neutrophils Relative %: 59 %
Platelets: 172 10*3/uL (ref 150–400)
RBC: 4.54 MIL/uL (ref 3.87–5.11)
RDW: 14.1 % (ref 11.5–15.5)
WBC: 7.5 10*3/uL (ref 4.0–10.5)

## 2018-07-25 LAB — URINALYSIS, ROUTINE W REFLEX MICROSCOPIC
Bilirubin Urine: NEGATIVE
Glucose, UA: NEGATIVE mg/dL
Ketones, ur: NEGATIVE mg/dL
Nitrite: NEGATIVE
Protein, ur: NEGATIVE mg/dL
Specific Gravity, Urine: 1.011 (ref 1.005–1.030)
pH: 6 (ref 5.0–8.0)

## 2018-07-25 LAB — BASIC METABOLIC PANEL
Anion gap: 10 (ref 5–15)
BUN: 22 mg/dL (ref 8–23)
CO2: 32 mmol/L (ref 22–32)
Calcium: 9.8 mg/dL (ref 8.9–10.3)
Chloride: 103 mmol/L (ref 98–111)
Creatinine, Ser: 1.54 mg/dL — ABNORMAL HIGH (ref 0.44–1.00)
GFR calc Af Amer: 35 mL/min — ABNORMAL LOW (ref >60)
GFR calc non Af Amer: 30 mL/min — ABNORMAL LOW (ref >60)
Glucose, Bld: 93 mg/dL (ref 70–99)
Potassium: 4.3 mmol/L (ref 3.5–5.1)
Sodium: 145 mmol/L (ref 135–145)

## 2018-07-25 MED ORDER — MORPHINE SULFATE (PF) 4 MG/ML IV SOLN
4.0000 mg | Freq: Once | INTRAVENOUS | Status: AC
  Administered 2018-07-25: 4 mg via INTRAVENOUS
  Filled 2018-07-25: qty 1

## 2018-07-25 MED ORDER — SODIUM CHLORIDE 0.9 % IV SOLN
INTRAVENOUS | Status: DC
  Administered 2018-07-25: 10:00:00 via INTRAVENOUS

## 2018-07-25 MED ORDER — CEPHALEXIN 500 MG PO CAPS: 500.0000 mg | ORAL_CAPSULE | Freq: Three times a day (TID) | ORAL | 0 refills | Status: DC

## 2018-07-25 MED ORDER — LORAZEPAM 2 MG/ML IJ SOLN
1.0000 mg | Freq: Once | INTRAMUSCULAR | Status: AC
Start: 2018-07-25 — End: 2018-07-25
  Administered 2018-07-25: 1 mg via INTRAVENOUS
  Filled 2018-07-25: qty 1

## 2018-07-25 MED ORDER — ONDANSETRON HCL 4 MG/2ML IJ SOLN
4.0000 mg | Freq: Once | INTRAMUSCULAR | Status: AC
  Administered 2018-07-25: 4 mg via INTRAVENOUS
  Filled 2018-07-25: qty 2

## 2018-07-25 MED ORDER — SODIUM CHLORIDE 0.9 % IV SOLN
1.0000 g | Freq: Once | INTRAVENOUS | Status: AC
  Administered 2018-07-25: 1 g via INTRAVENOUS
  Filled 2018-07-25: qty 10

## 2018-07-25 NOTE — ED Notes (Signed)
Patient refused pulse ox. BP documented. PTAR at bedside. Daughter at bedside.

## 2018-07-25 NOTE — ED Notes (Signed)
Informed [facility] for transportation back to [facility]).

## 2018-07-25 NOTE — ED Notes (Signed)
Patient transported to CT 

## 2018-07-25 NOTE — ED Notes (Signed)
Bed: WA07 Expected date:  Expected time:  Means of arrival:  Comments: EMS-hip pain 

## 2018-07-25 NOTE — ED Notes (Signed)
Patient is becoming agitated. Patient trying to get out of her bed stating she needs to go to the bathroom. Patient has a pure wick and brief on.

## 2018-07-25 NOTE — ED Notes (Signed)
Pt ambulated to restroom with 2 person assist. Pt attempted to provide urine specimen but was unable to at this time.

## 2018-07-25 NOTE — ED Notes (Addendum)
Verbal order from kohut, MD for posey belt and soft wrist restraints due to patient being violent.

## 2018-07-25 NOTE — ED Triage Notes (Addendum)
Patient arrived via [facility] from [facility] (memory care), patient started c/o of left flank/left hip area pain.Patient able to move legs and ambulatory for past week. Patient fell 1 week ago and staff did not call out for care. Doctor  evaluated patient yesterday at facility and patient was clear. Today patient still c/o of the pain and decided to send her to the ED. Patient is acting her normal. Oriented x2. (name and city). Hx. Tremors. Patient given all medications this morning per ems.  BP-124/86 HR-60 RR-16 99% RA

## 2018-07-25 NOTE — ED Provider Notes (Signed)
[facility]-EMERGENCY DEPT Provider Note   CSN: [id number] Arrival date & time: 07/25/18  0847     History   Chief Complaint Chief Complaint  Patient presents with  . left side pain    HPI Nicole [name] is a 84 y.o. female.  HPI   84 year old female sent from nursing facility for evaluation of left hip/flank pain.  Reported fall approximately 1 week ago but staff apparently did not feel that she needed emergent evaluation.  Also reportedly evaluated by physician at facility we do not feel that patient requires emergent evaluation at that time he very.  She is reportedly at her baseline with regards to her mental status.  When asked where she hurts patient points to her left upper quadrant.  She denies feeling sick to her stomach.  Denies diarrhea.  She denies any urinary complaints.  Past Medical History:  Diagnosis Date  . Acute bronchitis   . Allergic rhinitis, cause unspecified   . Alzheimer's disease 12/09/2015  . Esophageal reflux   . Essential and other specified forms of tremor   . Insomnia, unspecified   . Memory deficit 09/22/2014    Patient Active Problem List   Diagnosis Date Noted  . Alzheimer's disease 12/09/2015  . Tremor, essential 11/04/2015  . Depression 12/23/2014  . Generalized anxiety disorder 12/23/2014  . Memory deficit 09/22/2014  . TREMOR, ESSENTIAL 04/28/2008  . ASTHMATIC BRONCHITIS, ACUTE 04/28/2008  . ALLERGIC RHINITIS 04/28/2008  . ESOPHAGEAL REFLUX 04/28/2008  . INSOMNIA 04/28/2008    Past Surgical History:  Procedure Laterality Date  . bladder tack    . cataract surgery Bilateral sept and october 2015  . colon polyps    . TOTAL ABDOMINAL HYSTERECTOMY W/ BILATERAL SALPINGOOPHORECTOMY       OB History   None      Home Medications    Prior to Admission medications   Medication Sig Start Date End Date Taking? Authorizing Provider  acetaminophen (TYLENOL) 500 MG tablet Take 500 mg by mouth as needed.     [provider]  busPIRone (BUSPAR) 15 MG tablet Take 30 mg by mouth 2 (two) times daily.     [provider]  Calcium Citrate-Vitamin D (CITRACAL PETITES/VITAMIN D PO) Take 2,000 Units by mouth 2 (two) times daily. 2 tabs by mouth once daily     [provider]  cephALEXin (KEFLEX) 250 MG capsule Take 250 mg by mouth 4 (four) times daily.    [provider]  cholecalciferol (VITAMIN D) 1000 units tablet Take 1,000 Units by mouth daily.    [provider]  Cranberry 425 MG CAPS Take by mouth. Takes one cap daily    [provider]  cycloSPORINE (RESTASIS) 0.05 % ophthalmic emulsion Place 1 drop into both eyes 2 (two) times daily.    [provider]  dextromethorphan 15 MG/5ML syrup Take 10 mLs by mouth 2 (two) times daily.    [provider]  divalproex (DEPAKOTE SPRINKLE) 125 MG capsule Take 125 mg by mouth daily.    [provider]  divalproex (DEPAKOTE) 250 MG DR tablet Take 250 mg by mouth 2 (two) times daily.    [provider]  docusate sodium (COLACE) 100 MG capsule Take 100 mg by mouth daily as needed.     [provider]  donepezil (ARICEPT) 5 MG tablet Take 1 tablet (5 mg total) by mouth at bedtime. 06/28/18   [name], MD  fexofenadine (ALLEGRA) 180 MG tablet  Take 90 mg by mouth as needed.     [provider]  fluticasone (FLONASE) 50 MCG/ACT nasal spray Place 1 spray into both nostrils daily.    [provider]  HYDROcodone-acetaminophen (NORCO/VICODIN) 5-325 MG tablet Take 0.5 tablets by mouth 2 (two) times daily as needed for moderate pain.    [provider]  levothyroxine (SYNTHROID, LEVOTHROID) 75 MCG tablet Take 75 mcg by mouth daily before breakfast.    [provider]  LORazepam (ATIVAN) 0.5 MG tablet Take 0.5 mg by mouth 3 (three) times daily as needed.     [provider]  Melatonin 3 MG CAPS Take 5 mg by mouth daily. 2 caps po  qhs     [provider]  memantine (NAMENDA) 10 MG tablet Take 10 mg by mouth 2 (two) times daily.  04/27/16   [provider]  mirtazapine (REMERON) 15 MG tablet Take 2 tablets (30 mg total) by mouth at bedtime. 12/11/17   [name], MD  Polyethyl Glycol-Propyl Glycol (SYSTANE OP) Place 1 drop into both eyes 2 (two) times daily.    [provider]  senna (SENOKOT) 8.6 MG TABS tablet Take 1 tablet by mouth.    [provider]    Family History Family History  Problem Relation Age of Onset  . Allergies Brother   . Allergies Sister   . Cancer Father   . Tremor Father   . Other Mother        fracture hip age 71  . Cancer Brother   . Tremor Brother   . Cancer Brother   . Heart disease Brother   . Heart disease Brother     Social History Social History   Tobacco Use  . Smoking status: Never Smoker  . Smokeless tobacco: Never Used  Substance Use Topics  . Alcohol use: No  . Drug use: No     Allergies   Tramadol and Paxil [paroxetine hcl]   Review of Systems Review of Systems  All systems reviewed and negative, other than as noted in HPI. Physical Exam Updated Vital Signs BP 134/62 (BP Location: Left Arm)   Pulse (!) 57   Temp 97.6 F (36.4 C) (Oral)   Resp 17   SpO2 95%   Physical Exam  Constitutional: She appears well-developed and well-nourished. No distress.  HENT:  Head: Normocephalic and atraumatic.  Eyes: Conjunctivae are normal. Right eye exhibits no discharge. Left eye exhibits no discharge.  Neck: Neck supple.  Cardiovascular: Normal rate, regular rhythm and normal heart sounds. Exam reveals no gallop and no friction rub.  No murmur heard. Pulmonary/Chest: Effort normal and breath sounds normal. No respiratory distress.  Abdominal: Soft. She exhibits no distension. There is no tenderness.  Left upper quadrant tenderness without rebound or guarding.  No distention.  No overlying skin changes.  Musculoskeletal:  She exhibits no edema or tenderness.  Neurological: She is alert.  Skin: Skin is warm and dry.  Psychiatric: She has a normal mood and affect. Her behavior is normal. Thought content normal.  Nursing note and vitals reviewed.    ED Treatments / Results  Labs (all labs ordered are listed, but only abnormal results are displayed) Labs Reviewed  URINE CULTURE - Abnormal; Notable for the following components:      Result Value   Culture   (*)    Value: 20,000 COLONIES/mL STAPHYLOCOCCUS SPECIES (COAGULASE NEGATIVE) CALL MICROBIOLOGY LAB IF SENSITIVITIES ARE REQUIRED. Performed at [facility], [address] [place], [place] [address]    All other components within normal limits  BASIC METABOLIC PANEL - Abnormal; Notable for the following components:   Creatinine, Ser 1.54 (*)    GFR calc non Af Amer 30 (*)    GFR calc Af Amer 35 (*)    All other components within normal limits  URINALYSIS, ROUTINE W REFLEX MICROSCOPIC - Abnormal; Notable for the following components:   APPearance HAZY (*)    Hgb urine dipstick MODERATE (*)    Leukocytes, UA SMALL (*)    Bacteria, UA RARE (*)    All other components within normal limits  CBC WITH DIFFERENTIAL/PLATELET    EKG None  Radiology Ct Abdomen Pelvis Wo Contrast  Result Date: 07/25/2018 CLINICAL DATA:  Left hip and flank pain. The patient has been unable to move her legs for the past week. History of fall 1 week ago. EXAM: CT ABDOMEN AND PELVIS WITHOUT CONTRAST TECHNIQUE: Multidetector CT imaging of the abdomen and pelvis was performed following the standard protocol without IV contrast. COMPARISON:  CT abdomen and pelvis 09/23/2010. FINDINGS: Lower chest: Very small bilateral pleural effusions are present, greater on the left. Minimal dependent atelectasis is noted. Heart size is upper normal. No pericardial effusion. Hepatobiliary: No focal liver abnormality is seen. No gallstones, gallbladder wall thickening, or biliary  dilatation. Pancreas: Unremarkable. No pancreatic ductal dilatation or surrounding inflammatory changes. Spleen: Normal in size without focal abnormality. Adrenals/Urinary Tract: Adrenal glands are unremarkable. Kidneys are normal, without renal calculi, focal lesion, or hydronephrosis. Bladder is unremarkable. Stomach/Bowel: Diverticulosis without diverticulitis are seen along the descending and sigmoid colon. The colon is otherwise normal in appearance. The appendix is not visualized. No inflammatory change is seen. The stomach and small bowel appear normal. Vascular/Lymphatic: Aortic atherosclerosis. No enlarged abdominal or pelvic lymph nodes. Reproductive: Status post hysterectomy. No adnexal masses. Other: None. Musculoskeletal: No acute abnormality or focal lesion is seen. Lumbar degenerative disease appears most advanced at L4-5 and L5-S1. IMPRESSION: No acute abnormality or finding to explain the patient's symptoms. Very small bilateral pleural effusions. Atherosclerosis. Diverticulosis without diverticulitis. Electronically Signed   By: Drusilla Kanner M.D.   On: 07/25/2018 11:21    Procedures Procedures (including critical care time)  Medications Ordered in ED Medications  0.9 %  sodium chloride infusion (has no administration in time range)     Initial Impression / Assessment and Plan / ED Course  I have reviewed the triage vital signs and the nursing notes.  Pertinent labs & imaging results that were available during my care of the patient were reviewed by me and considered in my medical decision making (see chart for details).     84 year old female sent for evaluation of hip/left flank pain per report.  Patient actually points to her left upper quadrant of her abdomen is tender on exam.  Her exam is not consistent with hip pathology.  Not sure if her fall patient is actually related to her pain.  Her exam is most consistent with possible intra-abdominal process.  There are no  overlying skin changes. Diverticulitis? Tenderness doesn't extend to flank/CVA. Will check labs, UA and will also CT.   CT looks okay.  Possible UTI.  Will treat as such.  Results discussed with daughter.  Outpatient follow-up.  Final Clinical Impressions(s) / ED Diagnoses   Final diagnoses:  Abdominal pain, unspecified abdominal location    ED Discharge Orders    None       Raeford Razor, MD  07/26/18 1548  
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
## 2018-07-25 NOTE — ED Notes (Signed)
Patient ambulated to the bathroom with 2 assist. Patient unable to urinate at this time. MD made away. No orders at this time.

## 2018-07-25 NOTE — ED Notes (Signed)
Patient daughter left her number and will be back after her eye appointment. [phone])

## 2018-07-25 NOTE — ED Notes (Signed)
Lab called stating urine leaked in specimen bag and unable to collect urinalysis. Urine culture collected and saved in lab.

## 2018-07-25 NOTE — ED Notes (Signed)
Restraints removed by Juleen China, MD. Restraint removal documented.

## 2018-07-26 LAB — URINE CULTURE: Culture: 20000 — AB

## 2018-09-13 ENCOUNTER — Non-Acute Institutional Stay: Payer: Medicare Other | Admitting: Internal Medicine

## 2018-09-13 VITALS — BP 126/64 | HR 64 | Resp 16 | Ht 63.0 in | Wt 162.4 lb

## 2018-09-13 DIAGNOSIS — F411 Generalized anxiety disorder: Secondary | ICD-10-CM

## 2018-09-13 DIAGNOSIS — Z9181 History of falling: Secondary | ICD-10-CM

## 2018-09-13 DIAGNOSIS — R52 Pain, unspecified: Secondary | ICD-10-CM

## 2018-09-13 DIAGNOSIS — G301 Alzheimer's disease with late onset: Principal | ICD-10-CM

## 2018-09-13 DIAGNOSIS — F0280 Dementia in other diseases classified elsewhere without behavioral disturbance: Secondary | ICD-10-CM

## 2018-09-13 DIAGNOSIS — R634 Abnormal weight loss: Secondary | ICD-10-CM

## 2018-09-13 DIAGNOSIS — G4700 Insomnia, unspecified: Secondary | ICD-10-CM

## 2018-09-13 NOTE — Progress Notes (Addendum)
PALLIATIVE CARE CONSULT VISIT   PATIENT NAME: Nicole [name] DOB: 26-Jun-1934 MRN: [id number]  PRIMARY CARE PROVIDER:   [facility]  REFERRING PROVIDER: Georges Lynch NP ([facility]) Casimiro Needle Mabe Ginette Otto, [facility] [address] [place], [place] [address]   Dr. Stephanie Acre Nerology (OV 06/28/2018)   RESPONSIBLE PARTY:  (dtr) Marthenia Rolling [phone], (dtr) Waverly Ferrari [phone], (dtr) Marthenia Rolling [phone], e-mail: [email or web address]. Mag Thomasson [phone]  RECOMMENDATIONS and PLAN:  1. Alzheimer's dementia: Symptoms of progressive cognitive decline, exacerbation of anxiety, and daytime somnolence about one month earlier, improved with treatment for her UTI, decrease of Xanax to qhs (from qd), decrease of Depakote to bid (from tid) and with discontinuation of her prn Ativan. She continues donepezil 5mg  qhs and memantine 10mg  bid. She is followed by Dr. Pattricia Boss (neurology).  2. Lifelong anxiety disorder: She does continue to have underlying anxiety, with multi phone calls to her family during the day with requests to come home, please pick her up, repetitious questions, etc. Family utilizes good techniques of patient reassurance, redirection, frequent visits, and extreme patience. They are trying to balance medication control of symptoms without excessive somnolence and are happy with current regimen.  3. Weight loss: Her current weight is 162.4lbs, which is a loss of 16lbs (9%of her body weight) over the last 2 months. BMI 28.8kg/m2. He last Albumin in September was 4.1. Daughter reports fair appetite.   4. Back pain:  Daughter states patient has h/o lower back DDD with left leg radiculopathy which causes some related left knee pain. No c/o recent discomfort. Has prn Percocet available but not recently taken.  5. H/o falls:  Larey Seat 3 weeks ago early am as she was getting out of bed. She shows good safety awareness and is  cautious.     6. Insomnia: Melatonin 5mg  qhs. Stable sleeps through night. Patient naps for 1-2 hours in afternoons during quiet time.   7. Advanced care directives: Discussed with patient in presence of her daughter Steward Drone. Both are in agreement for DNR. I completed 2 forms; one for chart and one for daughter to bring with her on patient outings. We also discussed and completed a MOST form. Details: limited scope of medical intervention (avoid ICU, no intubation. CPAP or BiPAP if needed), yes to IVFs and antibiotics, determine use of tube feedings at time need.  8. Follow up: Palliative Care q1-2 months or prn.  I spent 90 minutes providing this consultation, from 2pm to 3:30pm. More than 50% of the time in this consultation was spent coordinating communication.   HISTORY OF PRESENT ILLNESS:  Nicole [name] is a 84 y.o. female resident of [facility] 1 rm [address]) with progressive cognitive decline and weight loss. H/o Alzheimer's disease, depression/generalized anxiety disorder, and essential tremor (head/neck/jaw). She is s/p ER visit 07/25/2018 for abdominal discomfort; she was treated for a UTI.  Palliative Care was asked to help address goals of care.   CODE STATUS: DNR and MOST form completed today  PPS: 40% HOSPICE ELIGIBILITY/DIAGNOSIS: No.  PAST MEDICAL HISTORY:  Past Medical History:  Diagnosis Date  . Acute bronchitis   . Allergic rhinitis, cause unspecified   . Alzheimer's disease 12/09/2015  . Esophageal reflux   . Essential and other specified forms of tremor   . Insomnia, unspecified   . Memory deficit 09/22/2014    SOCIAL HX:  Social History   Tobacco Use  . Smoking status:  Never Smoker  . Smokeless tobacco: Never Used  Substance Use Topics  . Alcohol use: No    ALLERGIES:  Allergies  Allergen Reactions  . Tramadol Nausea And Vomiting  . Paxil [Paroxetine Hcl] Other (See Comments)    Makes tremors worse     PERTINENT MEDICATIONS:  Outpatient  Encounter Medications as of 09/13/2018  Medication Sig  . acetaminophen (TYLENOL) 500 MG tablet Take 500 mg by mouth 2 (two) times daily as needed (For pain.).   Marland Kitchen busPIRone (BUSPAR) 15 MG tablet Take 15 mg by mouth 2 (two) times daily.   . Calcium Citrate-Vitamin D (CITRACAL PETITES/VITAMIN D PO) Take 2 tablets by mouth daily.   . cephALEXin (KEFLEX) 500 MG capsule Take 1 capsule (500 mg total) by mouth 3 (three) times daily.  . Cranberry 425 MG CAPS Take 425 mg by mouth daily.   . cycloSPORINE (RESTASIS) 0.05 % ophthalmic emulsion Place 1 drop into both eyes 2 (two) times daily.  . divalproex (DEPAKOTE) 250 MG DR tablet Take 250 mg by mouth 3 (three) times daily.   Marland Kitchen docusate sodium (COLACE) 100 MG capsule Take 100 mg by mouth 2 (two) times daily.   Marland Kitchen donepezil (ARICEPT) 5 MG tablet Take 1 tablet (5 mg total) by mouth at bedtime.  . fexofenadine (ALLEGRA) 180 MG tablet Take 90 mg by mouth daily.   . fluticasone (FLONASE) 50 MCG/ACT nasal spray Place 2 sprays into both nostrils daily.   Marland Kitchen HYDROcodone-acetaminophen (NORCO/VICODIN) 5-325 MG tablet Take 0.5 tablets by mouth 2 (two) times daily.   Marland Kitchen levothyroxine (SYNTHROID, LEVOTHROID) 75 MCG tablet Take 75 mcg by mouth daily before breakfast.  . LORazepam (ATIVAN) 0.5 MG tablet Take 0.5 mg by mouth 4 (four) times daily.   . Melatonin 5 MG TABS Take 5 mg by mouth at bedtime.  . memantine (NAMENDA) 10 MG tablet Take 10 mg by mouth 2 (two) times daily.   . mirtazapine (REMERON) 15 MG tablet Take 2 tablets (30 mg total) by mouth at bedtime.  Marland Kitchen OVER THE COUNTER MEDICATION Take 4,000 Units by mouth daily. Vitamin D3 2000 IU Softgel  . Polyethyl Glycol-Propyl Glycol (SYSTANE ULTRA OP) Place 1 drop into both eyes 2 (two) times daily.  . polyethylene glycol (MIRALAX / GLYCOLAX) packet Take 17 g by mouth daily. Mix in 8 ounces of water/juice and drink.  . senna (SENOKOT) 8.6 MG TABS tablet Take 1 tablet by mouth every 3 (three) days.   Marland Kitchen triamcinolone  cream (KENALOG) 0.1 % Apply 1 application topically 2 (two) times daily. Applies to right ankle.   No facility-administered encounter medications on file as of 09/13/2018.     PHYSICAL EXAM:  VS: BP 126/64, HR 64 RR 16 General: NAD, well nourished, pleasantly conversant.  Cardiovascular: regular rate and rhythm Pulmonary: clear ant fields Abdomen: soft, nontender, + bowel sounds Extremities: mild non-pitting LE edema, no joint deformities Skin: no rashes Neurological: nonfocal, lower jaw tremor  Anselm Lis, NP

## 2018-09-14 ENCOUNTER — Encounter: Payer: Self-pay | Admitting: Internal Medicine

## 2018-10-15 ENCOUNTER — Encounter: Payer: Self-pay | Admitting: Internal Medicine

## 2018-10-15 ENCOUNTER — Non-Acute Institutional Stay: Payer: Medicare Other | Admitting: Internal Medicine

## 2018-10-15 VITALS — BP 124/78 | HR 60 | Resp 16 | Wt 172.0 lb

## 2018-10-15 DIAGNOSIS — F0280 Dementia in other diseases classified elsewhere without behavioral disturbance: Secondary | ICD-10-CM

## 2018-10-15 DIAGNOSIS — Z9181 History of falling: Secondary | ICD-10-CM

## 2018-10-15 DIAGNOSIS — G301 Alzheimer's disease with late onset: Principal | ICD-10-CM

## 2018-10-15 NOTE — Progress Notes (Signed)
[facility] Telephone: [phone] Fax: [phone]  PATIENT NAME: GEARLDINE LOONEY DOB: 24-Jan-1934 MRN: [id number]  REFERRING AND PRIMARY CARE PROVIDER:    Georges Lynch NP ([facility]) Casimiro Needle Mabe Ginette Otto, [facility] [address] [place], [place] [address]   Dr. Stephanie Acre Nerology (OV 06/28/2018)   RESPONSIBLE PARTY:  (dtr) Marthenia Rolling [phone], (dtr) *Waverly Ferrari [phone], (dtr) Marthenia Rolling [phone], e-mail: [email or web address]. Odella Aquas [phone]  HISTORY OF PRESENT ILLNESS:  Nicole [name] is a 84 y.o. female resident of [facility] 1 rm [address]) with progressive cognitive decline and weight loss. H/o Alzheimer's disease, depression/generalized anxiety disorder, and essential tremor (head/neck/jaw). She is s/p ER visit 07/25/2018 for abdominal discomfort; she was treated for a UTI.  This is a f/u palliative care visit from 1 month earlier.   IMPRESSION / RECOMMENDATIONS:  1. Alzheimer's dementia/GAD: Her Depakote was decreased form 250 mg bid to 125 mg bid. She is constantly confused, and oriented to person only. She recognizes her family members. She can't remember me from when I passed through last week, but she seems less anxious. She isn't constantly asking to go home, as before, and has developed friendships among some of the other residents.  2. Weight loss: Her current weight is 172.lbs, which is back to her baseline from 3 months earlier.   4. Back pain / left knee pain: stable.  5. H/o falls: Recently completed course of physical therapy. No further falls.  6. Frequent UTIs: Started on Trimethoprin 100mg  for prophyxaxis.  7. Advanced care directives: Discussed with patient in presence of her daughter Steward Drone. Both are in agreement for DNR. I completed 2 forms; one for chart and one for daughter to bring with her on patient outings. We also discussed and completed a MOST form. Details:  limited scope of medical intervention (avoid ICU, no intubation. CPAP or BiPAP if needed), yes to IVFs and antibiotics, determine use of tube feedings at time need.  8. Follow up: Palliative Care q1-2 months or prn.  I spent 15 minutes providing this consultation from 12:00pm to12:15pm. More than 50% of this time was spent coordinating communication.   CODE STATUS: DNR on chart. MOST form details: DNR/DNI, Liited scope of medical interventions, IVFs and antibiotics if indicated.   PPS: 40% HOSPICE ELIGIBILITY/DIAGNOSIS: No  PAST MEDICAL HISTORY:  Past Medical History:  Diagnosis Date  . Acute bronchitis   . Allergic rhinitis, cause unspecified   . Alzheimer's disease (HCC) 12/09/2015  . Esophageal reflux   . Essential and other specified forms of tremor   . Insomnia, unspecified   . Memory deficit 09/22/2014    SOCIAL HX:  Social History   Tobacco Use  . Smoking status: Never Smoker  . Smokeless tobacco: Never Used  Substance Use Topics  . Alcohol use: No    ALLERGIES:  Allergies  Allergen Reactions  . Tramadol Nausea And Vomiting  . Paxil [Paroxetine Hcl] Other (See Comments)    Makes tremors worse     PERTINENT MEDICATIONS:  Outpatient Encounter Medications as of 10/15/2018  Medication Sig  . acetaminophen (TYLENOL) 500 MG tablet Take 500 mg by mouth 2 (two) times daily as needed (For pain.).   Marland Kitchen ALPRAZolam (XANAX) 0.25 MG tablet Take 0.25 mg by mouth at bedtime as needed for anxiety. One tab at 5 pm  . busPIRone (BUSPAR) 15 MG tablet Take 15 mg by mouth 2 (two) times daily.   . Calcium  Citrate-Vitamin D (CITRACAL PETITES/VITAMIN D PO) Take 2 tablets by mouth daily.   . Cranberry 425 MG CAPS Take 425 mg by mouth daily.   . cycloSPORINE (RESTASIS) 0.05 % ophthalmic emulsion Place 1 drop into both eyes 2 (two) times daily.  . divalproex (DEPAKOTE) 125 MG DR tablet Take 125 mg by mouth 2 (two) times daily. 1 tab po bid  . docusate sodium (COLACE) 100 MG capsule Take 100  mg by mouth 2 (two) times daily.   Marland Kitchen. donepezil (ARICEPT) 5 MG tablet Take 1 tablet (5 mg total) by mouth at bedtime.  . fexofenadine (ALLEGRA) 180 MG tablet Take 90 mg by mouth daily.   . fluticasone (FLONASE) 50 MCG/ACT nasal spray Place 2 sprays into both nostrils daily.   Marland Kitchen. HYDROcodone-acetaminophen (NORCO/VICODIN) 5-325 MG tablet Take 0.5 tablets by mouth 2 (two) times daily.   Marland Kitchen. levothyroxine (SYNTHROID, LEVOTHROID) 75 MCG tablet Take 75 mcg by mouth daily before breakfast.  . Melatonin 5 MG TABS Take 5 mg by mouth at bedtime.  . memantine (NAMENDA) 10 MG tablet Take 10 mg by mouth 2 (two) times daily.   . mirtazapine (REMERON) 15 MG tablet Take 2 tablets (30 mg total) by mouth at bedtime.  Marland Kitchen. OVER THE COUNTER MEDICATION Take 4,000 Units by mouth daily. Vitamin D3 2000 IU Softgel  . Polyethyl Glycol-Propyl Glycol (SYSTANE ULTRA OP) Place 1 drop into both eyes 2 (two) times daily.  . polyethylene glycol (MIRALAX / GLYCOLAX) packet Take 17 g by mouth daily. Mix in 8 ounces of water/juice and drink.  . senna (SENOKOT) 8.6 MG TABS tablet Take 1 tablet by mouth every 3 (three) days.   Marland Kitchen. trimethoprim (TRIMPEX) 100 MG tablet Take 100 mg by mouth daily. One tab po qhs  . triamcinolone cream (KENALOG) 0.1 % Apply 1 application topically 2 (two) times daily. Applies to right ankle.  . [DISCONTINUED] cephALEXin (KEFLEX) 500 MG capsule Take 1 capsule (500 mg total) by mouth 3 (three) times daily.  . [DISCONTINUED] LORazepam (ATIVAN) 0.5 MG tablet Take 0.5 mg by mouth 4 (four) times daily.    No facility-administered encounter medications on file as of 10/15/2018.     PHYSICAL EXAM:  VS: BP 124/78, HR 60, RR 16 General:Well nourished, pleasantly conversant. A & O to self. Cardiovascular: regular rate and rhythm Pulmonary: clear ant fields Abdomen: soft, nontender, + bowel sounds Extremities: soft pitting edema to 3/4 up calves. No joint deformities Skin: no rashes Neurological: Weakness but  otherwise nonfocal  Anselm LisMary P Boston Cookson, NP

## 2018-12-21 ENCOUNTER — Non-Acute Institutional Stay: Payer: Medicare Other | Admitting: Internal Medicine

## 2018-12-21 ENCOUNTER — Encounter: Payer: Self-pay | Admitting: Internal Medicine

## 2018-12-21 VITALS — BP 106/70 | HR 64 | Resp 12 | Wt 172.4 lb

## 2018-12-21 DIAGNOSIS — Z515 Encounter for palliative care: Secondary | ICD-10-CM

## 2018-12-21 NOTE — Progress Notes (Signed)
December 21, 2018 [facility]iative [facility] Telephone: [phone] Fax: [phone]  PATIENT NAME: Nicole [name] DOB: [birth year] MRN: [id number] [facility] C-1 Room 413 (12/17/2015)  REFERRING AND PRIMARY CARE PROVIDER:    Georges Lynch NP ([facility]) [facility], [facility] [address] [place], [place] [address]  Dr. Stephanie Acre Nerology (OV 06/28/2018)  RESPONSIBLE PARTY:(dtr) Marthenia Rolling [phone], (dtr) Waverly Ferrari [phone], (dtr) Marthenia Rolling [phone], e-mail:[email or web address] Thomasson 336 [phone]    IMPRESSION / RECOMMENDATIONS: 1.Alzheimer's dementia/GAD: Patient is constantly confused, and oriented to person only. She recognizes her family members, and chats about her daughters. She continues with anxiety worse in the evenings, but seems improved after past treatment for a UTI .She is on Trimethoprim for UTI prophylaxis.   2. H/O weight loss: Patient's current weight is 172.4 lbs which is stable . Her oral intake is  25% to 75%.  4. Back pain / left knee pain: stable.  5. H/o falls:non recently reported by staff.  6. Advanced care directives: DNR/MOST form on chart  8. Follow up: Palliative Care q1-2 months or prn.  I spent 30 minutes providing this consultation from 4:00pm to 4:30pm. More than 50% of this time was spent coordinating communication.   HISTORY OF PRESENT ILLNESS:Nicole W Johnsonis a 85 y.o.femaleresident of [facility])withprogressive cognitive decline and weight loss.H/o Alzheimer's disease, depression/generalized anxiety disorder,past ER visit for abdominal pain (treated for UTI) and essential tremor (head/neck/jaw). This is a f/u palliative care visit from 1 month earlier.  CODE STATUS: DNR on chart. MOST form details: DNR/DNI, Liited scope of medical interventions, IVFs and antibiotics if indicated.   PPS:  40% HOSPICE ELIGIBILITY/DIAGNOSIS: No PAST MEDICAL HISTORY:  Past Medical History:  Diagnosis Date  . Acute bronchitis   . Allergic rhinitis, cause unspecified   . Alzheimer's disease (HCC) 12/09/2015  . Esophageal reflux   . Essential and other specified forms of tremor   . Insomnia, unspecified   . Memory deficit 09/22/2014    SOCIAL HX:  Social History   Tobacco Use  . Smoking status: Never Smoker  . Smokeless tobacco: Never Used  Substance Use Topics  . Alcohol use: No    ALLERGIES:  Allergies  Allergen Reactions  . Tramadol Nausea And Vomiting  . Paxil [Paroxetine Hcl] Other (See Comments)    Makes tremors worse     PERTINENT MEDICATIONS:  Outpatient Encounter Medications as of 12/21/2018  Medication Sig  . acetaminophen (TYLENOL) 500 MG tablet Take 500 mg by mouth 2 (two) times daily as needed (For pain.).   Marland Kitchen ALPRAZolam (XANAX) 0.25 MG tablet Take 0.25 mg by mouth at bedtime as needed for anxiety. One tab at 5 pm  . busPIRone (BUSPAR) 15 MG tablet Take 15 mg by mouth 2 (two) times daily.   . Calcium Citrate-Vitamin D (CITRACAL PETITES/VITAMIN D PO) Take 2 tablets by mouth daily.   . Cranberry 425 MG CAPS Take 425 mg by mouth daily.   . cycloSPORINE (RESTASIS) 0.05 % ophthalmic emulsion Place 1 drop into both eyes 2 (two) times daily.  . divalproex (DEPAKOTE) 125 MG DR tablet Take 125 mg by mouth daily. At bedtime  . docusate sodium (COLACE) 100 MG capsule Take 100 mg by mouth 2 (two) times daily.   Marland Kitchen donepezil (ARICEPT) 5 MG tablet Take 1 tablet (5 mg total) by mouth at bedtime.  . fexofenadine (ALLEGRA) 180 MG tablet Take 90 mg by  mouth daily.   . fluticasone (FLONASE) 50 MCG/ACT nasal spray Place 2 sprays into both nostrils daily.   Marland Kitchen. HYDROcodone-acetaminophen (NORCO/VICODIN) 5-325 MG tablet Take 0.5 tablets by mouth 2 (two) times daily.   Marland Kitchen. levothyroxine (SYNTHROID, LEVOTHROID) 75 MCG tablet Take 75 mcg by mouth daily before breakfast.  . Melatonin 5 MG TABS Take 5  mg by mouth at bedtime.  . memantine (NAMENDA) 10 MG tablet Take 10 mg by mouth 2 (two) times daily.   . mirtazapine (REMERON) 15 MG tablet Take 2 tablets (30 mg total) by mouth at bedtime.  Marland Kitchen. OVER THE COUNTER MEDICATION Take 4,000 Units by mouth daily. Vitamin D3 2000 IU Softgel  . Polyethyl Glycol-Propyl Glycol (SYSTANE ULTRA OP) Place 1 drop into both eyes 2 (two) times daily.  . polyethylene glycol (MIRALAX / GLYCOLAX) packet Take 17 g by mouth daily. Mix in 8 ounces of water/juice and drink.  . senna (SENOKOT) 8.6 MG TABS tablet Take 1 tablet by mouth every 3 (three) days.   Marland Kitchen. triamcinolone cream (KENALOG) 0.1 % Apply 1 application topically 2 (two) times daily. Applies to right ankle.  . trimethoprim (TRIMPEX) 100 MG tablet Take 100 mg by mouth daily. One tab po qhs   No facility-administered encounter medications on file as of 12/21/2018.     PHYSICAL EXAM:  VS: BP 106/70, HR 64, RR 12 General:Well nourished, pleasantly conversant. A & O to self. Sitting in common area. Cardiovascular: regular rate and rhythm  Pulmonary: clear ant fields Abdomen: soft, nontender, + bowel sounds Extremities: no sig LE edema. No joint deformities Skin: no rashes Neurological: Weakness but otherwise nonfocal  Anselm LisMary P Epiphany Seltzer, NP

## 2019-01-23 ENCOUNTER — Ambulatory Visit (INDEPENDENT_AMBULATORY_CARE_PROVIDER_SITE_OTHER): Payer: Medicare Other | Admitting: Neurology

## 2019-01-23 ENCOUNTER — Telehealth: Payer: Self-pay | Admitting: *Deleted

## 2019-01-23 ENCOUNTER — Other Ambulatory Visit: Payer: Self-pay

## 2019-01-23 ENCOUNTER — Encounter: Payer: Self-pay | Admitting: Neurology

## 2019-01-23 VITALS — BP 122/60 | HR 80 | Ht 63.0 in | Wt 171.0 lb

## 2019-01-23 DIAGNOSIS — F0280 Dementia in other diseases classified elsewhere without behavioral disturbance: Secondary | ICD-10-CM

## 2019-01-23 DIAGNOSIS — G250 Essential tremor: Secondary | ICD-10-CM | POA: Diagnosis not present

## 2019-01-23 DIAGNOSIS — G301 Alzheimer's disease with late onset: Secondary | ICD-10-CM | POA: Diagnosis not present

## 2019-01-23 NOTE — Telephone Encounter (Signed)
error 

## 2019-01-23 NOTE — Telephone Encounter (Signed)
Will address at ov

## 2019-01-23 NOTE — Telephone Encounter (Signed)
Pt daughter called needing letter stating her mom  Is homebound and has dementia. Pt has appt today.

## 2019-01-23 NOTE — Progress Notes (Signed)
Reason for visit: Alzheimer's disease, essential tremor  Nicole [name] is an 85 y.o. female  History of present illness:  Nicole [name] is an 85 year old right-handed white female with a history of Alzheimer's disease with a progressive memory disorder.  When last seen, Aricept was reduced slightly because of some problems with agitation on the medication.  She has done better in this regard, she was placed on Depakote recently but this resulted in too much drowsiness and had to be discontinued.  The patient has had some gait instability, she will fall on occasion, she does not use a cane or a walker.  She has some left leg pain associated with sciatica, she was not felt to be a surgical candidate.  She has an essential tremor that has also worsening over time affecting the head and neck and the arms.  Her daughter also is developing a similar tremor.  The patient returns for an evaluation.  She does have a history of an underlying anxiety disorder.  Past Medical History:  Diagnosis Date  . Acute bronchitis   . Allergic rhinitis, cause unspecified   . Alzheimer's disease (HCC) 12/09/2015  . Esophageal reflux   . Essential and other specified forms of tremor   . Insomnia, unspecified   . Memory deficit 09/22/2014    Past Surgical History:  Procedure Laterality Date  . bladder tack    . cataract surgery Bilateral sept and october 2015  . colon polyps    . TOTAL ABDOMINAL HYSTERECTOMY W/ BILATERAL SALPINGOOPHORECTOMY      Family History  Problem Relation Age of Onset  . Allergies Brother   . Allergies Sister   . Cancer Father   . Tremor Father   . Other Mother        fracture hip age 63  . Cancer Brother   . Tremor Brother   . Cancer Brother   . Heart disease Brother   . Heart disease Brother     Social history:  reports that she has never smoked. She has never used smokeless tobacco. She reports that she does not drink alcohol or use drugs.    Allergies  Allergen  Reactions  . Tramadol Nausea And Vomiting  . Paxil [Paroxetine Hcl] Other (See Comments)    Makes tremors worse    Medications:  Prior to Admission medications   Medication Sig Start Date End Date Taking? Authorizing Provider  acetaminophen (TYLENOL) 500 MG tablet Take 500 mg by mouth 2 (two) times daily as needed (For pain.).    Yes [provider]  ALPRAZolam (XANAX) 0.25 MG tablet Take 0.25 mg by mouth at bedtime as needed for anxiety. One tab at 5 pm   Yes [provider]  busPIRone (BUSPAR) 15 MG tablet Take 15 mg by mouth 2 (two) times daily.    Yes [provider]  Calcium Citrate-Vitamin D (CITRACAL PETITES/VITAMIN D PO) Take 2 tablets by mouth daily.    Yes [provider]  Cranberry 425 MG CAPS Take 425 mg by mouth daily.    Yes [provider]  cycloSPORINE (RESTASIS) 0.05 % ophthalmic emulsion Place 1 drop into both eyes 2 (two) times daily.   Yes [provider]  divalproex (DEPAKOTE) 125 MG DR tablet Take 125 mg by mouth daily. At bedtime   Yes [provider]  docusate sodium (COLACE) 100 MG capsule Take 100 mg by mouth 2 (two) times daily.    Yes [provider]  donepezil (ARICEPT)  5 MG tablet Take 1 tablet (5 mg total) by mouth at bedtime. 06/28/18  Yes [name], MD  fexofenadine (ALLEGRA) 180 MG tablet Take 90 mg by mouth daily.    Yes [provider]  fluticasone (FLONASE) 50 MCG/ACT nasal spray Place 2 sprays into both nostrils daily.    Yes [provider]  HYDROcodone-acetaminophen (NORCO/VICODIN) 5-325 MG tablet Take 0.5 tablets by mouth 2 (two) times daily.    Yes [provider]  levothyroxine (SYNTHROID, LEVOTHROID) 75 MCG tablet Take 75 mcg by mouth daily before breakfast.   Yes [provider]  Melatonin 5 MG TABS Take 5 mg by mouth at bedtime.   Yes [provider]  memantine (NAMENDA) 10 MG tablet Take 10 mg by mouth 2 (two) times daily.   04/27/16  Yes [provider]  mirtazapine (REMERON) 15 MG tablet Take 2 tablets (30 mg total) by mouth at bedtime. 12/11/17  Yes [name], MD  OVER THE COUNTER MEDICATION Take 4,000 Units by mouth daily. Vitamin D3 2000 IU Softgel   Yes [provider]  Polyethyl Glycol-Propyl Glycol (SYSTANE ULTRA OP) Place 1 drop into both eyes 2 (two) times daily.   Yes [provider]  polyethylene glycol (MIRALAX / GLYCOLAX) packet Take 17 g by mouth daily. Mix in 8 ounces of water/juice and drink.   Yes [provider]  senna (SENOKOT) 8.6 MG TABS tablet Take 1 tablet by mouth every 3 (three) days.    Yes [provider]  triamcinolone cream (KENALOG) 0.1 % Apply 1 application topically 2 (two) times daily. Applies to right ankle.   Yes [provider]  trimethoprim (TRIMPEX) 100 MG tablet Take 100 mg by mouth daily. One tab po qhs   Yes [provider]    ROS:  Out of a complete 14 system review of symptoms, the patient complains only of the following symptoms, and all other reviewed systems are negative:   Joint pain Depression, anxiety Memory loss   Blood pressure 122/60, pulse 80, height  (1.6 m), weight 171 lb (77.6 kg), SpO2 94 %.  Physical Exam  General: The patient is alert and cooperative at the time of the examination.  The patient is moderately obese.  Skin: No significant peripheral edema is noted.   Neurologic Exam  Mental status: The patient is alert and oriented x 1 at the time of the examination (not oriented to place or date). The Mini-Mental status examination done today shows a total score of 10/30.   Cranial nerves: Facial symmetry is present. Speech is normal, no aphasia or dysarthria is noted. Extraocular movements are full. Visual fields are full.  Some masking the face is seen.  A head and neck tremor is noted.  Motor: The patient has good strength in all 4 extremities.  Sensory examination:  Soft touch sensation is symmetric on the face, arms, and legs.  Coordination: The patient has good finger-nose-finger and heel-to-shin bilaterally.  The patient does have some apraxia with use of the arms and legs.  Gait and station: The patient has a stooped posture, she is able to walk without assistance but has a slow deliberate gait.  Romberg is negative.  Tandem gait was not attempted.  Reflexes: Deep tendon reflexes are symmetric.   Assessment/Plan:  1.  Alzheimer's disease  2.  Essential tremor  3.  Gait disorder  4.  Generalized anxiety disorder  The patient will continue on her current dose of Aricept.  She is also on low-dose Remeron at night to help her sleep and to reduce anxiety.  She will follow-up here in 6 months, sooner if needed.  A DMV form was given for handicap parking.  Marlan Palau MD 01/23/2019 3:56 PM  [facility] [address] [place], [place] [address]  Phone [phone] Fax [phone]

## 2019-02-16 ENCOUNTER — Encounter (HOSPITAL_COMMUNITY)
Admission: EM | Disposition: A | Discharge status: Skilled Nursing Facility | Payer: Self-pay | Source: Home / Self Care | Attending: Family Medicine

## 2019-02-16 ENCOUNTER — Inpatient Hospital Stay (HOSPITAL_COMMUNITY)
Admission: EM | Admit: 2019-02-16 | Discharge: 2019-02-18 | DRG: 470 | Disposition: A | Discharge status: Skilled Nursing Facility | Payer: Medicare Other | Attending: Family Medicine | Admitting: Family Medicine

## 2019-02-16 ENCOUNTER — Encounter (HOSPITAL_COMMUNITY): Payer: Self-pay | Admitting: Emergency Medicine

## 2019-02-16 ENCOUNTER — Inpatient Hospital Stay (HOSPITAL_COMMUNITY): Payer: Medicare Other

## 2019-02-16 ENCOUNTER — Emergency Department (HOSPITAL_COMMUNITY): Payer: Medicare Other

## 2019-02-16 ENCOUNTER — Inpatient Hospital Stay (HOSPITAL_COMMUNITY): Payer: Medicare Other | Admitting: Anesthesiology

## 2019-02-16 ENCOUNTER — Other Ambulatory Visit: Payer: Self-pay

## 2019-02-16 DIAGNOSIS — D62 Acute posthemorrhagic anemia: Secondary | ICD-10-CM | POA: Diagnosis not present

## 2019-02-16 DIAGNOSIS — Z87440 Personal history of urinary (tract) infections: Secondary | ICD-10-CM

## 2019-02-16 DIAGNOSIS — K219 Gastro-esophageal reflux disease without esophagitis: Secondary | ICD-10-CM | POA: Diagnosis present

## 2019-02-16 DIAGNOSIS — I129 Hypertensive chronic kidney disease with stage 1 through stage 4 chronic kidney disease, or unspecified chronic kidney disease: Secondary | ICD-10-CM | POA: Diagnosis present

## 2019-02-16 DIAGNOSIS — Y92129 Unspecified place in nursing home as the place of occurrence of the external cause: Secondary | ICD-10-CM

## 2019-02-16 DIAGNOSIS — Z79891 Long term (current) use of opiate analgesic: Secondary | ICD-10-CM | POA: Diagnosis not present

## 2019-02-16 DIAGNOSIS — F419 Anxiety disorder, unspecified: Secondary | ICD-10-CM | POA: Diagnosis present

## 2019-02-16 DIAGNOSIS — F329 Major depressive disorder, single episode, unspecified: Secondary | ICD-10-CM | POA: Diagnosis present

## 2019-02-16 DIAGNOSIS — K5909 Other constipation: Secondary | ICD-10-CM | POA: Diagnosis present

## 2019-02-16 DIAGNOSIS — Z79899 Other long term (current) drug therapy: Secondary | ICD-10-CM

## 2019-02-16 DIAGNOSIS — Z8249 Family history of ischemic heart disease and other diseases of the circulatory system: Secondary | ICD-10-CM | POA: Diagnosis not present

## 2019-02-16 DIAGNOSIS — Z885 Allergy status to narcotic agent status: Secondary | ICD-10-CM | POA: Diagnosis not present

## 2019-02-16 DIAGNOSIS — N183 Chronic kidney disease, stage 3 (moderate): Secondary | ICD-10-CM | POA: Diagnosis present

## 2019-02-16 DIAGNOSIS — Z888 Allergy status to other drugs, medicaments and biological substances status: Secondary | ICD-10-CM

## 2019-02-16 DIAGNOSIS — F0280 Dementia in other diseases classified elsewhere without behavioral disturbance: Secondary | ICD-10-CM | POA: Diagnosis present

## 2019-02-16 DIAGNOSIS — Z8781 Personal history of (healed) traumatic fracture: Secondary | ICD-10-CM | POA: Diagnosis not present

## 2019-02-16 DIAGNOSIS — D72829 Elevated white blood cell count, unspecified: Secondary | ICD-10-CM | POA: Diagnosis present

## 2019-02-16 DIAGNOSIS — W19XXXA Unspecified fall, initial encounter: Secondary | ICD-10-CM | POA: Diagnosis present

## 2019-02-16 DIAGNOSIS — M25552 Pain in left hip: Secondary | ICD-10-CM | POA: Diagnosis present

## 2019-02-16 DIAGNOSIS — Y92091 Bathroom in other non-institutional residence as the place of occurrence of the external cause: Secondary | ICD-10-CM

## 2019-02-16 DIAGNOSIS — Z66 Do not resuscitate: Secondary | ICD-10-CM | POA: Diagnosis present

## 2019-02-16 DIAGNOSIS — Z419 Encounter for procedure for purposes other than remedying health state, unspecified: Secondary | ICD-10-CM

## 2019-02-16 DIAGNOSIS — G309 Alzheimer's disease, unspecified: Secondary | ICD-10-CM | POA: Diagnosis present

## 2019-02-16 DIAGNOSIS — Z90710 Acquired absence of both cervix and uterus: Secondary | ICD-10-CM | POA: Diagnosis not present

## 2019-02-16 DIAGNOSIS — Z79890 Hormone replacement therapy (postmenopausal): Secondary | ICD-10-CM | POA: Diagnosis not present

## 2019-02-16 DIAGNOSIS — D696 Thrombocytopenia, unspecified: Secondary | ICD-10-CM | POA: Diagnosis not present

## 2019-02-16 DIAGNOSIS — Z96649 Presence of unspecified artificial hip joint: Secondary | ICD-10-CM

## 2019-02-16 DIAGNOSIS — S72002A Fracture of unspecified part of neck of left femur, initial encounter for closed fracture: Secondary | ICD-10-CM | POA: Diagnosis present

## 2019-02-16 DIAGNOSIS — R251 Tremor, unspecified: Secondary | ICD-10-CM | POA: Diagnosis present

## 2019-02-16 DIAGNOSIS — E039 Hypothyroidism, unspecified: Secondary | ICD-10-CM | POA: Diagnosis present

## 2019-02-16 HISTORY — PX: ANTERIOR APPROACH HEMI HIP ARTHROPLASTY: SHX6690

## 2019-02-16 LAB — CBC WITH DIFFERENTIAL/PLATELET
Abs Immature Granulocytes: 0.05 10*3/uL (ref 0.00–0.07)
Basophils Absolute: 0 10*3/uL (ref 0.0–0.1)
Basophils Relative: 0 %
Eosinophils Absolute: 0.1 10*3/uL (ref 0.0–0.5)
Eosinophils Relative: 1 %
HCT: 42.2 % (ref 36.0–46.0)
Hemoglobin: 13.8 g/dL (ref 12.0–15.0)
Immature Granulocytes: 0 %
Lymphocytes Relative: 7 %
Lymphs Abs: 0.8 10*3/uL (ref 0.7–4.0)
MCH: 32.7 pg (ref 26.0–34.0)
MCHC: 32.7 g/dL (ref 30.0–36.0)
MCV: 100 fL (ref 80.0–100.0)
Monocytes Absolute: 0.8 10*3/uL (ref 0.1–1.0)
Monocytes Relative: 7 %
Neutro Abs: 9.5 10*3/uL — ABNORMAL HIGH (ref 1.7–7.7)
Neutrophils Relative %: 85 %
Platelets: 164 10*3/uL (ref 150–400)
RBC: 4.22 MIL/uL (ref 3.87–5.11)
RDW: 12.8 % (ref 11.5–15.5)
WBC: 11.2 10*3/uL — ABNORMAL HIGH (ref 4.0–10.5)
nRBC: 0 % (ref 0.0–0.2)

## 2019-02-16 LAB — BASIC METABOLIC PANEL
Anion gap: 8 (ref 5–15)
BUN: 30 mg/dL — ABNORMAL HIGH (ref 8–23)
CO2: 26 mmol/L (ref 22–32)
Calcium: 9.3 mg/dL (ref 8.9–10.3)
Chloride: 104 mmol/L (ref 98–111)
Creatinine, Ser: 1.46 mg/dL — ABNORMAL HIGH (ref 0.44–1.00)
GFR calc Af Amer: 38 mL/min — ABNORMAL LOW (ref >60)
GFR calc non Af Amer: 33 mL/min — ABNORMAL LOW (ref >60)
Glucose, Bld: 137 mg/dL — ABNORMAL HIGH (ref 70–99)
Potassium: 4.3 mmol/L (ref 3.5–5.1)
Sodium: 138 mmol/L (ref 135–145)

## 2019-02-16 SURGERY — HEMIARTHROPLASTY, HIP, DIRECT ANTERIOR APPROACH, FOR FRACTURE
Anesthesia: Spinal | Site: Hip | Laterality: Left

## 2019-02-16 MED ORDER — DOCUSATE SODIUM 100 MG PO CAPS: 100.0000 mg | ORAL_CAPSULE | Freq: Two times a day (BID) | ORAL | Status: DC

## 2019-02-16 MED ORDER — DONEPEZIL HCL 5 MG PO TABS
5.0000 mg | ORAL_TABLET | Freq: Every day | ORAL | Status: DC
  Administered 2019-02-16 – 2019-02-17 (×2): 5 mg via ORAL
  Filled 2019-02-16 (×2): qty 1

## 2019-02-16 MED ORDER — BUSPIRONE HCL 5 MG PO TABS
15.0000 mg | ORAL_TABLET | Freq: Every day | ORAL | Status: DC
  Administered 2019-02-17 – 2019-02-18 (×2): 15 mg via ORAL
  Filled 2019-02-16 (×2): qty 1

## 2019-02-16 MED ORDER — ACETAMINOPHEN 325 MG PO TABS: 325.0000 mg | ORAL_TABLET | Freq: Four times a day (QID) | ORAL | Status: DC | PRN

## 2019-02-16 MED ORDER — ACETAMINOPHEN 500 MG PO TABS
500.0000 mg | ORAL_TABLET | Freq: Four times a day (QID) | ORAL | Status: AC
  Administered 2019-02-16 – 2019-02-17 (×4): 500 mg via ORAL
  Filled 2019-02-16 (×4): qty 1

## 2019-02-16 MED ORDER — TRANEXAMIC ACID 1000 MG/10ML IV SOLN: 2000.0000 mg | INTRAVENOUS | Status: DC

## 2019-02-16 MED ORDER — MELATONIN 5 MG PO TABS
5.0000 mg | ORAL_TABLET | Freq: Every day | ORAL | Status: DC
  Administered 2019-02-16 – 2019-02-17 (×2): 5 mg via ORAL
  Filled 2019-02-16 (×2): qty 1

## 2019-02-16 MED ORDER — VANCOMYCIN HCL 1000 MG IV SOLR
INTRAVENOUS | Status: DC | PRN
  Administered 2019-02-16: 1000 mg via TOPICAL

## 2019-02-16 MED ORDER — METHOCARBAMOL 500 MG IVPB - SIMPLE MED
500.0000 mg | Freq: Four times a day (QID) | INTRAVENOUS | Status: DC | PRN
  Filled 2019-02-16: qty 50

## 2019-02-16 MED ORDER — FENTANYL CITRATE (PF) 100 MCG/2ML IJ SOLN
25.0000 ug | INTRAMUSCULAR | Status: DC | PRN
  Administered 2019-02-16: 05:00:00 25 ug via INTRAVENOUS
  Filled 2019-02-16: qty 2

## 2019-02-16 MED ORDER — BUPIVACAINE HCL (PF) 0.5 % IJ SOLN
INTRAMUSCULAR | Status: DC | PRN
  Administered 2019-02-16: 2.6 mL via INTRATHECAL

## 2019-02-16 MED ORDER — ONDANSETRON HCL 4 MG/2ML IJ SOLN
INTRAMUSCULAR | Status: DC | PRN
  Administered 2019-02-16: 4 mg via INTRAVENOUS

## 2019-02-16 MED ORDER — MEMANTINE HCL 10 MG PO TABS
10.0000 mg | ORAL_TABLET | Freq: Two times a day (BID) | ORAL | Status: DC
  Administered 2019-02-16 – 2019-02-18 (×4): 10 mg via ORAL
  Filled 2019-02-16 (×4): qty 1

## 2019-02-16 MED ORDER — MENTHOL 3 MG MT LOZG: 1.0000 | LOZENGE | OROMUCOSAL | Status: DC | PRN

## 2019-02-16 MED ORDER — STERILE WATER FOR IRRIGATION IR SOLN
Status: DC | PRN
  Administered 2019-02-16: 2000 mL

## 2019-02-16 MED ORDER — SODIUM CHLORIDE 0.9 % IV SOLN
INTRAVENOUS | Status: DC
  Administered 2019-02-16: 14:00:00 via INTRAVENOUS

## 2019-02-16 MED ORDER — VITAMIN D 25 MCG (1000 UNIT) PO TABS
4000.0000 [IU] | ORAL_TABLET | Freq: Every day | ORAL | Status: DC
  Administered 2019-02-17 – 2019-02-18 (×2): 4000 [IU] via ORAL
  Filled 2019-02-16 (×2): qty 4

## 2019-02-16 MED ORDER — TRANEXAMIC ACID-NACL 1000-0.7 MG/100ML-% IV SOLN: 1000.0000 mg | INTRAVENOUS | Status: DC

## 2019-02-16 MED ORDER — POVIDONE-IODINE 10 % EX SWAB: 2.0000 "application " | Freq: Once | CUTANEOUS | Status: DC

## 2019-02-16 MED ORDER — ACETAMINOPHEN 325 MG PO TABS: 650.0000 mg | ORAL_TABLET | Freq: Four times a day (QID) | ORAL | Status: DC | PRN

## 2019-02-16 MED ORDER — CEFAZOLIN SODIUM-DEXTROSE 2-4 GM/100ML-% IV SOLN
INTRAVENOUS | Status: AC
  Filled 2019-02-16: qty 100

## 2019-02-16 MED ORDER — ENOXAPARIN SODIUM 30 MG/0.3ML ~~LOC~~ SOLN
30.0000 mg | SUBCUTANEOUS | Status: DC
  Administered 2019-02-17 – 2019-02-18 (×2): 30 mg via SUBCUTANEOUS
  Filled 2019-02-16 (×2): qty 0.3

## 2019-02-16 MED ORDER — 0.9 % SODIUM CHLORIDE (POUR BTL) OPTIME
TOPICAL | Status: DC | PRN
  Administered 2019-02-16: 1000 mL

## 2019-02-16 MED ORDER — FENTANYL CITRATE (PF) 100 MCG/2ML IJ SOLN: 25.0000 ug | INTRAMUSCULAR | Status: DC | PRN

## 2019-02-16 MED ORDER — PROPOFOL 10 MG/ML IV BOLUS
INTRAVENOUS | Status: AC
  Filled 2019-02-16: qty 40

## 2019-02-16 MED ORDER — PHENYLEPHRINE HCL 10 MG/ML IJ SOLN
INTRAMUSCULAR | Status: AC
  Filled 2019-02-16: qty 2

## 2019-02-16 MED ORDER — CEFAZOLIN SODIUM-DEXTROSE 2-4 GM/100ML-% IV SOLN: 2.0000 g | INTRAVENOUS | Status: DC

## 2019-02-16 MED ORDER — SENNA 8.6 MG PO TABS
1.0000 | ORAL_TABLET | ORAL | Status: DC
  Administered 2019-02-17: 8.6 mg via ORAL
  Filled 2019-02-16: qty 1

## 2019-02-16 MED ORDER — METHOCARBAMOL 500 MG PO TABS
500.0000 mg | ORAL_TABLET | Freq: Four times a day (QID) | ORAL | Status: DC | PRN
  Administered 2019-02-17: 500 mg via ORAL
  Filled 2019-02-16: qty 1

## 2019-02-16 MED ORDER — HYDROCODONE-ACETAMINOPHEN 7.5-325 MG PO TABS: 1.0000 | ORAL_TABLET | ORAL | Status: DC | PRN

## 2019-02-16 MED ORDER — HYDRALAZINE HCL 20 MG/ML IJ SOLN: 10.0000 mg | Freq: Four times a day (QID) | INTRAMUSCULAR | Status: DC | PRN

## 2019-02-16 MED ORDER — GLYCOPYRROLATE 0.2 MG/ML IJ SOLN
INTRAMUSCULAR | Status: DC | PRN
  Administered 2019-02-16: 0.2 mg via INTRAVENOUS

## 2019-02-16 MED ORDER — ALUM & MAG HYDROXIDE-SIMETH 200-200-20 MG/5ML PO SUSP: 30.0000 mL | ORAL | Status: DC | PRN

## 2019-02-16 MED ORDER — SODIUM CHLORIDE 0.9 % IV SOLN
INTRAVENOUS | Status: DC | PRN
  Administered 2019-02-16: 25 ug/min via INTRAVENOUS

## 2019-02-16 MED ORDER — FENTANYL CITRATE (PF) 250 MCG/5ML IJ SOLN
INTRAMUSCULAR | Status: DC | PRN
  Administered 2019-02-16 (×2): 25 ug via INTRAVENOUS

## 2019-02-16 MED ORDER — ONDANSETRON HCL 4 MG/2ML IJ SOLN
INTRAMUSCULAR | Status: AC
  Filled 2019-02-16: qty 2

## 2019-02-16 MED ORDER — POLYETHYLENE GLYCOL 3350 17 G PO PACK: 17.0000 g | PACK | Freq: Every day | ORAL | Status: DC | PRN

## 2019-02-16 MED ORDER — MIRTAZAPINE 30 MG PO TABS
30.0000 mg | ORAL_TABLET | Freq: Every day | ORAL | Status: DC
  Administered 2019-02-16 – 2019-02-17 (×2): 30 mg via ORAL
  Filled 2019-02-16 (×2): qty 1
  Filled 2019-02-16 (×2): qty 2

## 2019-02-16 MED ORDER — LACTATED RINGERS IV SOLN
INTRAVENOUS | Status: DC | PRN
  Administered 2019-02-16: 10:00:00 via INTRAVENOUS

## 2019-02-16 MED ORDER — PROPOFOL 500 MG/50ML IV EMUL
INTRAVENOUS | Status: DC | PRN
  Administered 2019-02-16: 30 ug/kg/min via INTRAVENOUS

## 2019-02-16 MED ORDER — KETAMINE HCL 10 MG/ML IJ SOLN
INTRAMUSCULAR | Status: DC | PRN
  Administered 2019-02-16: 15 mg via INTRAVENOUS

## 2019-02-16 MED ORDER — HYDROCODONE-ACETAMINOPHEN 5-325 MG PO TABS
1.0000 | ORAL_TABLET | ORAL | Status: DC | PRN
  Administered 2019-02-16 – 2019-02-17 (×5): 1 via ORAL
  Filled 2019-02-16 (×5): qty 1

## 2019-02-16 MED ORDER — ONDANSETRON HCL 4 MG/2ML IJ SOLN: 4.0000 mg | Freq: Four times a day (QID) | INTRAMUSCULAR | Status: DC | PRN

## 2019-02-16 MED ORDER — SORBITOL 70 % SOLN
30.0000 mL | Freq: Every day | Status: DC | PRN
  Filled 2019-02-16: qty 30

## 2019-02-16 MED ORDER — ONDANSETRON HCL 4 MG PO TABS: 4.0000 mg | ORAL_TABLET | Freq: Four times a day (QID) | ORAL | Status: DC | PRN

## 2019-02-16 MED ORDER — TRANEXAMIC ACID 1000 MG/10ML IV SOLN
2000.0000 mg | Freq: Once | INTRAVENOUS | Status: AC
  Administered 2019-02-16: 12:00:00 2000 mg via TOPICAL
  Filled 2019-02-16: qty 20

## 2019-02-16 MED ORDER — HYDROCODONE-ACETAMINOPHEN 7.5-325 MG PO TABS: 1.0000 | ORAL_TABLET | Freq: Four times a day (QID) | ORAL | 0 refills | Status: AC | PRN

## 2019-02-16 MED ORDER — ONDANSETRON HCL 4 MG/2ML IJ SOLN: 4.0000 mg | Freq: Once | INTRAMUSCULAR | Status: DC | PRN

## 2019-02-16 MED ORDER — MAGNESIUM CITRATE PO SOLN: 1.0000 | Freq: Once | ORAL | Status: DC | PRN

## 2019-02-16 MED ORDER — HYDROCODONE-ACETAMINOPHEN 5-325 MG PO TABS
0.5000 | ORAL_TABLET | Freq: Two times a day (BID) | ORAL | Status: DC
  Administered 2019-02-16 – 2019-02-18 (×2): 0.5 via ORAL
  Filled 2019-02-16 (×2): qty 1

## 2019-02-16 MED ORDER — ALPRAZOLAM 0.25 MG PO TABS
0.2500 mg | ORAL_TABLET | Freq: Every evening | ORAL | Status: DC
  Administered 2019-02-16 – 2019-02-17 (×2): 0.25 mg via ORAL
  Filled 2019-02-16 (×2): qty 1

## 2019-02-16 MED ORDER — MORPHINE SULFATE (PF) 2 MG/ML IV SOLN: 1.0000 mg | INTRAVENOUS | Status: DC | PRN

## 2019-02-16 MED ORDER — POLYETHYLENE GLYCOL 3350 17 G PO PACK
17.0000 g | PACK | Freq: Every day | ORAL | Status: DC
  Administered 2019-02-17 – 2019-02-18 (×2): 17 g via ORAL
  Filled 2019-02-16 (×2): qty 1

## 2019-02-16 MED ORDER — ENOXAPARIN SODIUM 40 MG/0.4ML ~~LOC~~ SOLN: 40.0000 mg | Freq: Every day | SUBCUTANEOUS | 0 refills | Status: AC

## 2019-02-16 MED ORDER — DOCUSATE SODIUM 100 MG PO CAPS
100.0000 mg | ORAL_CAPSULE | Freq: Two times a day (BID) | ORAL | Status: DC
  Administered 2019-02-16 – 2019-02-18 (×4): 100 mg via ORAL
  Filled 2019-02-16 (×4): qty 1

## 2019-02-16 MED ORDER — CEFAZOLIN SODIUM-DEXTROSE 2-4 GM/100ML-% IV SOLN
2.0000 g | Freq: Four times a day (QID) | INTRAVENOUS | Status: AC
  Administered 2019-02-16 – 2019-02-17 (×3): 2 g via INTRAVENOUS
  Filled 2019-02-16 (×4): qty 100

## 2019-02-16 MED ORDER — BUSPIRONE HCL 10 MG PO TABS
25.0000 mg | ORAL_TABLET | Freq: Every day | ORAL | Status: DC
  Administered 2019-02-16 – 2019-02-17 (×2): 25 mg via ORAL
  Filled 2019-02-16 (×2): qty 3

## 2019-02-16 MED ORDER — PROPOFOL 10 MG/ML IV BOLUS
INTRAVENOUS | Status: DC | PRN
  Administered 2019-02-16: 20 mg via INTRAVENOUS

## 2019-02-16 MED ORDER — EPHEDRINE 5 MG/ML INJ
INTRAVENOUS | Status: AC
  Filled 2019-02-16: qty 10

## 2019-02-16 MED ORDER — PHENOL 1.4 % MT LIQD: 1.0000 | OROMUCOSAL | Status: DC | PRN

## 2019-02-16 MED ORDER — VANCOMYCIN HCL 1000 MG IV SOLR
INTRAVENOUS | Status: AC
  Filled 2019-02-16: qty 1000

## 2019-02-16 MED ORDER — CEFAZOLIN SODIUM-DEXTROSE 2-3 GM-%(50ML) IV SOLR
INTRAVENOUS | Status: DC | PRN
  Administered 2019-02-16: 2 g via INTRAVENOUS

## 2019-02-16 MED ORDER — TRANEXAMIC ACID-NACL 1000-0.7 MG/100ML-% IV SOLN
INTRAVENOUS | Status: DC | PRN
  Administered 2019-02-16: 1000 mg via INTRAVENOUS

## 2019-02-16 MED ORDER — ALBUMIN HUMAN 5 % IV SOLN
INTRAVENOUS | Status: DC | PRN
  Administered 2019-02-16: 11:00:00 via INTRAVENOUS

## 2019-02-16 MED ORDER — ALBUMIN HUMAN 5 % IV SOLN
INTRAVENOUS | Status: AC
  Filled 2019-02-16: qty 250

## 2019-02-16 MED ORDER — LEVOTHYROXINE SODIUM 75 MCG PO TABS
75.0000 ug | ORAL_TABLET | Freq: Every day | ORAL | Status: DC
  Administered 2019-02-17 – 2019-02-18 (×2): 75 ug via ORAL
  Filled 2019-02-16 (×2): qty 1

## 2019-02-16 MED ORDER — LORATADINE 10 MG PO TABS
10.0000 mg | ORAL_TABLET | Freq: Every day | ORAL | Status: DC
  Administered 2019-02-17 – 2019-02-18 (×2): 10 mg via ORAL
  Filled 2019-02-16 (×2): qty 1

## 2019-02-16 MED ORDER — KETAMINE HCL 10 MG/ML IJ SOLN
INTRAMUSCULAR | Status: AC
  Filled 2019-02-16: qty 1

## 2019-02-16 MED ORDER — ACETAMINOPHEN 10 MG/ML IV SOLN: 1000.0000 mg | Freq: Once | INTRAVENOUS | Status: DC | PRN

## 2019-02-16 MED ORDER — EPHEDRINE SULFATE-NACL 50-0.9 MG/10ML-% IV SOSY
PREFILLED_SYRINGE | INTRAVENOUS | Status: DC | PRN
  Administered 2019-02-16 (×3): 5 mg via INTRAVENOUS

## 2019-02-16 MED ORDER — FENTANYL CITRATE (PF) 100 MCG/2ML IJ SOLN
INTRAMUSCULAR | Status: AC
  Filled 2019-02-16: qty 2

## 2019-02-16 MED ORDER — LACTATED RINGERS IV SOLN: INTRAVENOUS | Status: DC

## 2019-02-16 MED ORDER — TRANEXAMIC ACID-NACL 1000-0.7 MG/100ML-% IV SOLN
INTRAVENOUS | Status: AC
  Filled 2019-02-16: qty 100

## 2019-02-16 SURGICAL SUPPLY — 41 items
ADH SKN CLS APL DERMABOND .7 (GAUZE/BANDAGES/DRESSINGS)
APL PRP STRL LF DISP 70% ISPRP (MISCELLANEOUS) ×2
BAG SPEC THK2 15X12 ZIP CLS (MISCELLANEOUS) ×1
BAG ZIPLOCK 12X15 (MISCELLANEOUS) ×3 IMPLANT
BIPOLAR PROS AML 46 (Hips) ×2 IMPLANT
BIPOLAR PROS AML 46MM (Hips) ×1 IMPLANT
BLADE SAW SGTL 18X1.27X75 (BLADE) ×2 IMPLANT
BLADE SAW SGTL 18X1.27X75MM (BLADE) ×1
BLADE SURG SZ10 CARB STEEL (BLADE) ×6 IMPLANT
CHLORAPREP W/TINT 26 (MISCELLANEOUS) ×5 IMPLANT
COVER PERINEAL POST (MISCELLANEOUS) ×3 IMPLANT
COVER SURGICAL LIGHT HANDLE (MISCELLANEOUS) ×3 IMPLANT
COVER WAND RF STERILE (DRAPES) IMPLANT
DERMABOND ADVANCED (GAUZE/BANDAGES/DRESSINGS)
DERMABOND ADVANCED .7 DNX12 (GAUZE/BANDAGES/DRESSINGS) IMPLANT
DRAPE STERI IOBAN 125X83 (DRAPES) ×3 IMPLANT
DRAPE U-SHAPE 47X51 STRL (DRAPES) ×6 IMPLANT
DRSG AQUACEL AG ADV 3.5X10 (GAUZE/BANDAGES/DRESSINGS) IMPLANT
DRSG MEPILEX BORDER 4X8 (GAUZE/BANDAGES/DRESSINGS) ×2 IMPLANT
ELECT BLADE TIP CTD 4 INCH (ELECTRODE) ×6 IMPLANT
ELECT REM PT RETURN 15FT ADLT (MISCELLANEOUS) ×3 IMPLANT
GLOVE SURG SS PI 7.5 STRL IVOR (GLOVE) ×6 IMPLANT
GOWN STRL REUS W/TWL LRG LVL3 (GOWN DISPOSABLE) ×3 IMPLANT
HEAD BIPOLAR PROS AML 46 (Hips) IMPLANT
HEAD FEM STD 28X+1.5 STRL (Hips) ×2 IMPLANT
HOOD PEEL AWAY FLYTE STAYCOOL (MISCELLANEOUS) ×9 IMPLANT
KIT BASIN OR (CUSTOM PROCEDURE TRAY) ×3 IMPLANT
KIT TURNOVER KIT A (KITS) IMPLANT
MARKER SKIN DUAL TIP RULER LAB (MISCELLANEOUS) ×3 IMPLANT
PACK ANTERIOR HIP CUSTOM (KITS) ×3 IMPLANT
STEM CORAIL KA12 (Stem) ×2 IMPLANT
SUT ETHIBOND 2 V 37 (SUTURE) ×2 IMPLANT
SUT MNCRL AB 4-0 PS2 18 (SUTURE) ×3 IMPLANT
SUT VIC AB 0 CT1 36 (SUTURE) ×2 IMPLANT
SUT VIC AB 1 CT1 27 (SUTURE) ×3
SUT VIC AB 1 CT1 27XBRD ANTBC (SUTURE) ×1 IMPLANT
SUT VIC AB 2-0 CT1 27 (SUTURE) ×9
SUT VIC AB 2-0 CT1 TAPERPNT 27 (SUTURE) ×1 IMPLANT
TOWEL OR 17X26 10 PK STRL BLUE (TOWEL DISPOSABLE) ×3 IMPLANT
TOWEL OR NON WOVEN STRL DISP B (DISPOSABLE) ×3 IMPLANT
TRAY FOLEY MTR SLVR 16FR STAT (SET/KITS/TRAYS/PACK) ×2 IMPLANT

## 2019-02-16 NOTE — Discharge Instructions (Signed)
° ° °  1. Change dressings as needed °2. May shower but keep incisions covered and dry °3. Take lovenox to prevent blood clots °4. Take stool softeners as needed °5. Take pain meds as needed ° °

## 2019-02-16 NOTE — ED Triage Notes (Signed)
Pt arrives via [facility] from [facility]. Pt fell yesterday afternoon in the restroom. Facility noticed that she was complaining of pain this morning and call for transport. Pt demented, but alert to normal per staff.

## 2019-02-16 NOTE — ED Notes (Signed)
Bed: BM18 Expected date:  Expected time:  Means of arrival:  Comments: 83F fall hip pain no deformity

## 2019-02-16 NOTE — Op Note (Signed)
ANTERIOR APPROACH [facility] HIP ATRTHROPLASY  Procedure Note Nicole [name]   [id number]  Pre-op Diagnosis: Left femoral neck Hip Fracture     Post-op Diagnosis: same   Operative Procedures  1. Prosthetic replacement for femoral neck fracture. CPT [phone]  Personnel  Surgeon(s): Tarry Kos, MD  ASSIST: Oneal Grout, PA-C; necessary for the timely completion of procedure and due to complexity of procedure.   Anesthesia: spinal  Prosthesis: Depuy Femur: Corail KA 12 Head: 46 mm size: +1 Bearing Type: bipolar  Hip Hemiarthroplasty (Anterior Approach) Op Note:  After informed consent was obtained and the operative extremity marked in the holding area, the patient was brought back to the operating room and placed supine on the HANA table. Next, the operative extremity was prepped and draped in normal sterile fashion. Surgical timeout occurred verifying patient identification, surgical site, surgical procedure and administration of antibiotics.  A modified anterior Smith-Peterson approach to the hip was performed, using the interval between tensor fascia lata and sartorius.  Dissection was carried bluntly down onto the anterior hip capsule. The lateral femoral circumflex vessels were identified and coagulated. A capsulotomy was performed and the capsular flaps tagged for later repair.  Fluoroscopy was utilized to prepare for the femoral neck cut. The neck osteotomy was performed. The femoral head was removed and found a 46 mm head was the appropriate fit.    We then turned our attention to the femur.  After placing the femoral hook, the leg was taken to externally rotated, extended and adducted position taking care to perform soft tissue releases to allow for adequate mobilization of the femur. Soft tissue was cleared from the shoulder of the greater trochanter and the hook elevator used to improve exposure of the proximal femur. Sequential broaching performed up to a size 12. Trial neck  and head were placed. The leg was brought back up to neutral and the construct reduced. The position and sizing of components, offset and leg lengths were checked using fluoroscopy. Stability of the construct was checked in extension and external rotation without any subluxation or impingement of prosthesis. We dislocated the prosthesis, dropped the leg back into position, removed trial components, and irrigated copiously. The final stem and head was then placed, the leg brought back up, the system reduced and fluoroscopy used to verify positioning.  We irrigated, obtained hemostasis and closed the capsule using #2 ethibond suture.  The fascia was closed with #1 vicryl plus, the deep fat layer was closed with 0 vicryl, the subcutaneous layers closed with 2.0 Vicryl Plus and the skin closed with staples. A sterile dressing was applied. The patient was awakened in the operating room and taken to recovery in stable condition. All sponge, needle, and instrument counts were correct at the end of the case.   Position: supine  Complications: none.  Time Out: performed   Drains/Packing: none  Estimated blood loss: see anesthesia record  Returned to Recovery Room: in good condition.   Antibiotics: yes   Mechanical VTE (DVT) Prophylaxis: sequential compression devices, TED thigh-high  Chemical VTE (DVT) Prophylaxis: lovenox  Fluid Replacement: Crystalloid: see anesthesia record  Specimens Removed: 1 to pathology   Sponge and Instrument Count Correct? yes   PACU: portable radiograph - low AP   Admission: inpatient status, start PT & OT POD#1  Plan/RTC: Return in 2 weeks for staple removal. Return in 6 weeks to see MD.  Weight Bearing/Load Lower Extremity: full  Hip precautions: none  N. Glee Arvin, MD  [facility] [phone] 11:44 AM    Implant Name Type Inv. Item Serial No. Manufacturer Lot No. LRB No. Used  HIP BALL ARTICU DEPUY - [id number] Hips HIP BALL ARTICU DEPUY  [id number] DEPUY SYNTHES [id number] Left 1  BIPOLAR PROS AML - [id number] Hips BIPOLAR PROS AML [id number] DEPUY SYNTHES [id number] Left 1  STEM CORAIL KA12 - [id number] Stem STEM CORAIL KA12 [id number] DEPUY SYNTHES [id number] Left 1

## 2019-02-16 NOTE — ED Notes (Signed)
I have just spoken with her daughter, Steward Drone. She is aware that pt. Has broken her hip and she enquires as to reasons for procedure and "the risks, especially at her age?" I obtain her phone number, which I send with the O.R. transporter, Simonia. Simonia is also aware that no consent has been obtained as of yet d/t pt. Dementia and daughter's questions. Pt. Is taken to O.R. at this time.

## 2019-02-16 NOTE — Plan of Care (Signed)

## 2019-02-16 NOTE — ED Notes (Signed)
Steward Drone (daughter and POA)- [phone] Please call with updates.

## 2019-02-16 NOTE — Anesthesia Postprocedure Evaluation (Signed)
Anesthesia Post Note  Patient: Nicole [name]  Procedure(s) Performed: ANTERIOR APPROACH HEMI HIP ATRTHROPLASY (Left Hip)     Patient location during evaluation: Nursing Unit Anesthesia Type: Spinal Level of consciousness: oriented and awake and alert Pain management: pain level controlled Vital Signs Assessment: post-procedure vital signs reviewed and stable Respiratory status: spontaneous breathing and respiratory function stable Cardiovascular status: blood pressure returned to baseline and stable Postop Assessment: no headache, no backache, no apparent nausea or vomiting and patient able to bend at knees Anesthetic complications: no    Last Vitals:  Vitals:   02/16/19 1212 02/16/19 1215  BP: 109/62 (P) 111/64  Pulse:    Resp: 19   Temp:  (!) (P) 36.4 C  SpO2:  (P) 98%    Last Pain:  Vitals:   02/16/19 0714  TempSrc:   PainSc: 10-Worst pain ever                 Trevor Iha

## 2019-02-16 NOTE — Consult Note (Signed)
ORTHOPAEDIC CONSULTATION  REQUESTING PHYSICIAN: Darlin Drop, DO  Chief Complaint: Left femoral neck fracture  HPI: Nicole [name] is a 85 y.o. female who presents with left hip fracture s/p mechanical fall 2 nights ago.  The patient endorses severe pain in the left hip and back but was felt that it may have been coming from her back.  She continued to be unable to ambulate so she was brought to the ED. Denies LOC/fever/chills/nausea/vomiting.  Walks with assistive devices (walker, cane, wheelchair).  Lives at [facility].  Denies LOC, neck pain, abd pain.  Past Medical History:  Diagnosis Date  . Acute bronchitis   . Allergic rhinitis, cause unspecified   . Alzheimer's disease (HCC) 12/09/2015  . Esophageal reflux   . Essential and other specified forms of tremor   . Insomnia, unspecified   . Memory deficit 09/22/2014   Past Surgical History:  Procedure Laterality Date  . bladder tack    . cataract surgery Bilateral sept and october 2015  . colon polyps    . TOTAL ABDOMINAL HYSTERECTOMY W/ BILATERAL SALPINGOOPHORECTOMY     Social History   Socioeconomic History  . Marital status: Married    Spouse name: Not on file  . Number of children: 2  . Years of education: HS  . Highest education level: Not on file  Occupational History  . 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 Financial resource strain: Not on file  . Food insecurity:    Worry: Not on file    Inability: Not on file  . Transportation needs:    Medical: Not on file    Non-medical: Not on file  Tobacco Use  . Smoking status: Never Smoker  . Smokeless tobacco: Never Used  Substance and Sexual Activity  . Alcohol use: No  . Drug use: No  . Sexual activity: Not on file  Lifestyle  . Physical activity:    Days per week: Not on file    Minutes per session: Not on file  . Stress: Not on file  Relationships  . Social connections:    Talks on phone: Not on file   Gets together: Not on file    Attends religious service: Not on file    Active member of club or organization: Not on file    Attends meetings of clubs or organizations: Not on file    Relationship status: Not on file  Other Topics Concern  . Not on file  Social History Narrative   Patient drinks caffeine occasionally.   Patient is right handed.    Lives at [facility], memory care.  P [phone] F [phone]   Family History  Problem Relation Age of Onset  . Allergies Brother   . Allergies Sister   . Cancer Father   . Tremor Father   . Other Mother        fracture hip age 73  . Cancer Brother   . Tremor Brother   . Cancer Brother   . Heart disease Brother   . Heart disease Brother    Allergies  Allergen Reactions  . Tramadol Nausea And Vomiting  . Paxil [Paroxetine Hcl] Other (See Comments)    Makes tremors worse   Prior to Admission medications   Medication Sig Start Date End Date Taking? Authorizing Provider  acetaminophen (TYLENOL) 500 MG tablet Take 500 mg by mouth 2 (two) times daily as needed (For pain.).  Yes [provider]  ALPRAZolam (XANAX) 0.25 MG tablet Take 0.25 mg by mouth every evening. One tab at 5 pm    Yes [provider]  busPIRone (BUSPAR) 15 MG tablet Take 15-22.5 mg by mouth 2 (two) times daily. Take 15 mg by mouth daily at 0800 and 22.5 mg at 1400   Yes [provider]  Cholecalciferol (VITAMIN D) 50 MCG (2000 UT) tablet Take 4,000 Units by mouth daily.   Yes [provider]  Cranberry 425 MG CAPS Take 425 mg by mouth daily.    Yes [provider]  cycloSPORINE (RESTASIS) 0.05 % ophthalmic emulsion Place 1 drop into both eyes 2 (two) times daily.   Yes [provider]  docusate sodium (COLACE) 100 MG capsule Take 100 mg by mouth 2 (two) times daily.    Yes [provider]  donepezil (ARICEPT) 5 MG tablet Take 1 tablet (5 mg total) by mouth at bedtime. 06/28/18  Yes [name],  MD  fexofenadine (ALLEGRA) 180 MG tablet Take 90 mg by mouth daily.    Yes [provider]  fluticasone (FLONASE) 50 MCG/ACT nasal spray Place 2 sprays into both nostrils daily.    Yes [provider]  HYDROcodone-acetaminophen (NORCO/VICODIN) 5-325 MG tablet Take 0.5 tablets by mouth 2 (two) times daily.    Yes [provider]  levothyroxine (SYNTHROID, LEVOTHROID) 75 MCG tablet Take 75 mcg by mouth daily before breakfast.   Yes [provider]  Melatonin 5 MG TABS Take 5 mg by mouth at bedtime.   Yes [provider]  memantine (NAMENDA) 10 MG tablet Take 10 mg by mouth 2 (two) times daily.  04/27/16  Yes [provider]  mirtazapine (REMERON) 15 MG tablet Take 2 tablets (30 mg total) by mouth at bedtime. 12/11/17  Yes [name], MD  naproxen sodium (ALEVE) 220 MG tablet Take 220 mg by mouth daily as needed (discomfort).   Yes [provider]  polyethylene glycol (MIRALAX / GLYCOLAX) packet Take 17 g by mouth daily. Mix in 8 ounces of water/juice and drink.   Yes [provider]  senna (SENOKOT) 8.6 MG TABS tablet Take 1 tablet by mouth every 3 (three) days.    Yes [provider]  Triamcinolone Acetonide (TRIAMCINOLONE 0.1 % CREAM : EUCERIN) CREA Apply 1 application topically 2 (two) times daily as needed for rash or itching.   Yes [provider]  trimethoprim (TRIMPEX) 100 MG tablet Take 100 mg by mouth daily. One tab po qhs   Yes [provider]   Dg Chest Port 1 View  Result Date: 02/16/2019 CLINICAL DATA:  Hip fracture. Pt arrives via [facility] from [facility]. Pt fell yesterday afternoon in the restroom. Facility noticed that she was complaining of pain this morning and call for transport. Pt demented, but alert to normal per staff. EXAM: PORTABLE CHEST 1 VIEW COMPARISON:  01/07/2014 FINDINGS: Cardiac silhouette is normal in size. No mediastinal or hilar masses. No evidence of adenopathy.  Clear lungs.  No pleural effusion or pneumothorax. Skeletal structures are grossly intact. IMPRESSION: No active disease. Electronically Signed   By: Amie Portland M.D.   On: 02/16/2019 05:50   Dg Hip Unilat W Or Wo Pelvis 2-3 Views Left  Result Date: 02/16/2019 CLINICAL DATA:  Pt arrives via [facility] from [facility]. Pt fell yesterday afternoon in the restroom. Facility noticed that she was complaining of pain this morning and call for transport. Pt demented, but  alert to normal per staff. EXAM: DG HIP (WITH OR WITHOUT PELVIS) 2-3V LEFT COMPARISON:  None. FINDINGS: Displaced fracture of the left femoral neck. Distal fracture component has displaced superiorly by approximately 1 cm. There is varus angulation. No other fractures. No bone lesion. Hip joints, SI joints and symphysis pubis are normally aligned. Skeletal structures are demineralized. IMPRESSION: Mildly displaced, varus angulated left femoral neck fracture. No dislocation. Electronically Signed   By: Amie Portland M.D.   On: 02/16/2019 05:50    All pertinent xrays, MRI, CT independently reviewed and interpreted  Positive ROS: All other systems have been reviewed and were otherwise negative with the exception of those mentioned in the HPI and as above.  Physical Exam: General: Alert, no acute distress Cardiovascular: No pedal edema Respiratory: No cyanosis, no use of accessory musculature GI: No organomegaly, abdomen is soft and non-tender Skin: No lesions in the area of chief complaint Neurologic: Sensation intact distally Psychiatric: Patient has dementia Lymphatic: No axillary or cervical lymphadenopathy  MUSCULOSKELETAL:  - severe pain with movement of the hip and extremity - skin intact - NVI distally - compartments soft  Assessment: Left femoral neck fracture  Plan: - partial hip replacement is recommended, daughter Onalee Hua is aware of r/b/a and wish to proceed, informed consent obtained - medical optimization per  primary team - surgery is planned for this morning  Thank you for the consult and the opportunity to see Ms. Amado Nash. Glee Arvin, MD [facility] [phone] 9:07 AM

## 2019-02-16 NOTE — ED Notes (Signed)
All clothing and belongings I have just placed into locker #26 in our [facility].

## 2019-02-16 NOTE — Transfer of Care (Signed)
Immediate Anesthesia Transfer of Care Note  Patient: Nicole [name]  Procedure(s) Performed: ANTERIOR APPROACH HEMI HIP ATRTHROPLASY (Left Hip)  Patient Location: PACU  Anesthesia Type:Spinal  Level of Consciousness: awake, alert  and oriented  Airway & Oxygen Therapy: Patient Spontanous Breathing and Patient connected to face mask oxygen  Post-op Assessment: Report given to RN and Post -op Vital signs reviewed and stable  Post vital signs: Reviewed and stable  Last Vitals:  Vitals Value Taken Time  BP 89/59 02/16/2019 12:15 PM  Temp    Pulse 62 02/16/2019 12:19 PM  Resp 19 02/16/2019 12:19 PM  SpO2 97 % 02/16/2019 12:19 PM  Vitals shown include unvalidated device data.  Last Pain:  Vitals:   02/16/19 0714  TempSrc:   PainSc: 10-Worst pain ever         Complications: No apparent anesthesia complications

## 2019-02-16 NOTE — Anesthesia Procedure Notes (Signed)
Spinal  Patient location during procedure: OR Start time: 02/16/2019 10:05 AM End time: 02/16/2019 10:14 AM Staffing Anesthesiologist: Trevor Iha, MD Preanesthetic Checklist Completed: patient identified, site marked, surgical consent, pre-op evaluation, timeout performed, IV checked, risks and benefits discussed and monitors and equipment checked Spinal Block Patient position: sitting Prep: DuraPrep Patient monitoring: heart rate, cardiac monitor, continuous pulse ox and blood pressure Approach: midline Location: L2-3 Injection technique: single-shot Needle Needle type: Sprotte  Needle gauge: 24 G Needle length: 9 cm Needle insertion depth: 5 cm Assessment Sensory level: T4 Additional Notes Block assessed prior to surgery.

## 2019-02-16 NOTE — Anesthesia Procedure Notes (Signed)
Procedure Name: MAC Date/Time: 02/16/2019 10:07 AM Performed by: Niel Hummer, CRNA Pre-anesthesia Checklist: Patient identified, Emergency Drugs available, Suction available and Patient being monitored Patient Re-evaluated:Patient Re-evaluated prior to induction Oxygen Delivery Method: Simple face mask

## 2019-02-16 NOTE — Progress Notes (Signed)
Dr Buford Dresser aware of Neuro status. Pt may tx to room at this time

## 2019-02-16 NOTE — H&P (Signed)
History and Physical  Nicole [name] ZOX:[id number] DOB: November 17, 1933 DOA: 02/16/2019  Referring physician: Dr Read Drivers PCP: [facility]  Outpatient Specialists: None Patient coming from: SN[facility]  Chief Complaint: Marletta Lor   HPI: Nicole [name] is a 85 y.o. female with medical history significant for dementia, depression/anxiety, hypothyroidism, CKD 3, chronic constipation who presented to [facility] from SNF after a fall that occurred yesterday afternoon.  Patient has a history of dementia and is unable to provide a history.  History is mainly obtained from ED physician and medical records.  Patient was in her usual state of health prior to this.  Earlier this morning staff noticed patient was complaining of left hip pain.  EMS called and patient was transferred to ED for further evaluation.  X-ray of the left hip reveals fracture mildly displaced varus angulated left femoral neck fracture no dislocation. [facility] asked to admit for left hip fracture.   ED Course: Vital signs remarkable for uncontrolled hypertension and tachycardia.  Lab studies remarkable for mildly elevated WBC 11.2K.    Review of Systems: Review of systems as noted in the HPI. All other systems reviewed and are negative.   Past Medical History:  Diagnosis Date  . Acute bronchitis   . Allergic rhinitis, cause unspecified   . Alzheimer's disease (HCC) 12/09/2015  . Esophageal reflux   . Essential and other specified forms of tremor   . Insomnia, unspecified   . Memory deficit 09/22/2014   Past Surgical History:  Procedure Laterality Date  . bladder tack    . cataract surgery Bilateral sept and october 2015  . colon polyps    . TOTAL ABDOMINAL HYSTERECTOMY W/ BILATERAL SALPINGOOPHORECTOMY      Social History:  reports that she has never smoked. She has never used smokeless tobacco. She reports that she does not drink alcohol or use drugs.   Allergies  Allergen Reactions  . Tramadol Nausea And Vomiting  .  Paxil [Paroxetine Hcl] Other (See Comments)    Makes tremors worse    Family History  Problem Relation Age of Onset  . Allergies Brother   . Allergies Sister   . Cancer Father   . Tremor Father   . Other Mother        fracture hip age 55  . Cancer Brother   . Tremor Brother   . Cancer Brother   . Heart disease Brother   . Heart disease Brother      Prior to Admission medications   Medication Sig Start Date End Date Taking? Authorizing Provider  acetaminophen (TYLENOL) 500 MG tablet Take 500 mg by mouth 2 (two) times daily as needed (For pain.).    Yes [provider]  ALPRAZolam (XANAX) 0.25 MG tablet Take 0.25 mg by mouth every evening. One tab at 5 pm    Yes [provider]  busPIRone (BUSPAR) 15 MG tablet Take 15-22.5 mg by mouth 2 (two) times daily. Take 15 mg by mouth daily at 0800 and 22.5 mg at 1400   Yes [provider]  Cholecalciferol (VITAMIN D) 50 MCG (2000 UT) tablet Take 4,000 Units by mouth daily.   Yes [provider]  Cranberry 425 MG CAPS Take 425 mg by mouth daily.    Yes [provider]  cycloSPORINE (RESTASIS) 0.05 % ophthalmic emulsion Place 1 drop into both eyes 2 (two) times daily.   Yes [provider]  docusate sodium (COLACE) 100 MG capsule Take 100 mg by mouth  2 (two) times daily.    Yes [provider]  donepezil (ARICEPT) 5 MG tablet Take 1 tablet (5 mg total) by mouth at bedtime. 06/28/18  Yes [name], MD  fexofenadine (ALLEGRA) 180 MG tablet Take 90 mg by mouth daily.    Yes [provider]  fluticasone (FLONASE) 50 MCG/ACT nasal spray Place 2 sprays into both nostrils daily.    Yes [provider]  HYDROcodone-acetaminophen (NORCO/VICODIN) 5-325 MG tablet Take 0.5 tablets by mouth 2 (two) times daily.    Yes [provider]  levothyroxine (SYNTHROID, LEVOTHROID) 75 MCG tablet Take 75 mcg by mouth daily before breakfast.   Yes [provider]   Melatonin 5 MG TABS Take 5 mg by mouth at bedtime.   Yes [provider]  memantine (NAMENDA) 10 MG tablet Take 10 mg by mouth 2 (two) times daily.  04/27/16  Yes [provider]  mirtazapine (REMERON) 15 MG tablet Take 2 tablets (30 mg total) by mouth at bedtime. 12/11/17  Yes [name], MD  naproxen sodium (ALEVE) 220 MG tablet Take 220 mg by mouth daily as needed (discomfort).   Yes [provider]  polyethylene glycol (MIRALAX / GLYCOLAX) packet Take 17 g by mouth daily. Mix in 8 ounces of water/juice and drink.   Yes [provider]  senna (SENOKOT) 8.6 MG TABS tablet Take 1 tablet by mouth every 3 (three) days.    Yes [provider]  Triamcinolone Acetonide (TRIAMCINOLONE 0.1 % CREAM : EUCERIN) CREA Apply 1 application topically 2 (two) times daily as needed for rash or itching.   Yes [provider]  trimethoprim (TRIMPEX) 100 MG tablet Take 100 mg by mouth daily. One tab po qhs   Yes [provider]    Physical Exam: BP (!) 143/67 (BP Location: Left Arm)   Pulse 65   Temp 98.3 F (36.8 C) (Oral)   Resp 17   SpO2 91%   . General: 85 y.o. year-old female well developed well nourished in no acute distress.  Alert and confused in a state of dementia. . Cardiovascular: Regular rate and rhythm with no rubs or gallops.  No thyromegaly or JVD noted.  No lower extremity edema. 2/4 pulses in all 4 extremities. Marland Kitchen Respiratory: Clear to auscultation with no wheezes or rales. Good inspiratory effort. . Abdomen: Soft nontender nondistended with normal bowel sounds x4 quadrants. . Muskuloskeletal: Mildly shortened left lower extremity.  No cyanosis, clubbing or edema noted bilaterally. . Neuro: CN II-XII intact, strength, sensation, reflexes . Skin: No obvious ulcerative lesions noted or rashes . Psychiatry: Judgement and insight appear altered. Mood is appropriate for condition and setting          Labs on Admission:  Basic  Metabolic Panel: Recent Labs  Lab 02/16/19 0516  NA 138  K 4.3  CL 104  CO2 26  GLUCOSE 137*  BUN 30*  CREATININE 1.46*  CALCIUM 9.3   Liver Function Tests: No results for input(s): AST, ALT, ALKPHOS, BILITOT, PROT, ALBUMIN in the last 168 hours. No results for input(s): LIPASE, AMYLASE in the last 168 hours. No results for input(s): AMMONIA in the last 168 hours. CBC: Recent Labs  Lab 02/16/19 0516  WBC 11.2*  NEUTROABS 9.5*  HGB 13.8  HCT 42.2  MCV 100.0  PLT 164   Cardiac Enzymes: No results for input(s): CKTOTAL, CKMB, CKMBINDEX, TROPONINI in the last 168 hours.  BNP (last 3 results) No results for input(s): BNP  in the last 8760 hours.  ProBNP (last 3 results) No results for input(s): PROBNP in the last 8760 hours.  CBG: No results for input(s): GLUCAP in the last 168 hours.  Radiological Exams on Admission: Dg Chest Port 1 View  Result Date: 02/16/2019 CLINICAL DATA:  Hip fracture. Pt arrives via [facility] from [facility]. Pt fell yesterday afternoon in the restroom. Facility noticed that she was complaining of pain this morning and call for transport. Pt demented, but alert to normal per staff. EXAM: PORTABLE CHEST 1 VIEW COMPARISON:  01/07/2014 FINDINGS: Cardiac silhouette is normal in size. No mediastinal or hilar masses. No evidence of adenopathy. Clear lungs.  No pleural effusion or pneumothorax. Skeletal structures are grossly intact. IMPRESSION: No active disease. Electronically Signed   By: Amie Portland M.D.   On: 02/16/2019 05:50   Dg Hip Unilat W Or Wo Pelvis 2-3 Views Left  Result Date: 02/16/2019 CLINICAL DATA:  Pt arrives via [facility] from [facility]. Pt fell yesterday afternoon in the restroom. Facility noticed that she was complaining of pain this morning and call for transport. Pt demented, but alert to normal per staff. EXAM: DG HIP (WITH OR WITHOUT PELVIS) 2-3V LEFT COMPARISON:  None. FINDINGS: Displaced fracture of the left femoral neck. Distal  fracture component has displaced superiorly by approximately 1 cm. There is varus angulation. No other fractures. No bone lesion. Hip joints, SI joints and symphysis pubis are normally aligned. Skeletal structures are demineralized. IMPRESSION: Mildly displaced, varus angulated left femoral neck fracture. No dislocation. Electronically Signed   By: Amie Portland M.D.   On: 02/16/2019 05:50    EKG: I independently viewed the EKG done and my findings are as followed: Sinus rhythm rate of 95 with no specific ST-T changes.  Assessment/Plan Present on Admission: **None**  Principal Problem:   Left displaced femoral neck fracture (HCC) Active Problems:   S/p left hip fracture   Left hip fracture post fall Presented with left hip pain X-ray revealed left hip fracture mildly displaced varus angulated left femoral neck fracture no dislocation. Orthopedic surgery was consulted Dr. Roda Shutters Pain management in place Bowel regimen to prevent opiate-induced constipation DVT prophylaxis as recommended by orthopedic surgery Fall precautions  Uncontrolled HTN Suspect contributed by pain Manage pain IV hydralazine PRN for SBP>180 or dbp>105 C/w monitoring VS  Chronic constipation Resume bowel regimen  Chronic depression/anxiety Resume home medications On Xanax 25 mg nightly  Hypothyroidism Resume levothyroxine  Dementia Resume donepezil  CKD 3 Baseline creatinine 1.4 with GFR 33 Avoid nephrotoxic agents/dehydration/hypotension Will monitor urine output Start gentle IV fluid hydration LR at 75cc/hr BMP am  Leukocytosis  Suspect reactive from fracture No sign of active infective process CBC am    Risks: High risk for decompensation due to left hip fracture, multiple comorbidities and advanced age. Will require at least 2 midnights for further management and treatment of present condition.    DVT prophylaxis: SCDs  Code Status: DNR  Family Communication: None at bedside    Disposition Plan: Admit to Med Surg  Consults called: Ortho called by ED provider  Admission status: Inpatient    Darlin Drop MD [facility] Pager [phone]  If 7PM-7AM, please contact night-coverage [email or web address] Password [facility]  02/16/2019, 8:54 AM

## 2019-02-16 NOTE — Anesthesia Preprocedure Evaluation (Addendum)
Anesthesia Evaluation  Patient identified by MRN, date of birth, ID band Patient awake    Reviewed: Allergy & Precautions, NPO status , Patient's Chart, lab work & pertinent test results  Airway Mallampati: II  TM Distance: >3 FB Neck ROM: Full    Dental  (+) Teeth Intact, Dental Advisory Given   Pulmonary neg pulmonary ROS,    Pulmonary exam normal breath sounds clear to auscultation       Cardiovascular Exercise Tolerance: Good negative cardio ROS Normal cardiovascular exam Rhythm:Regular Rate:Normal  02/16/2019 EKG  SR R 95   Neuro/Psych PSYCHIATRIC DISORDERS Anxiety Depression Dementia    GI/Hepatic Neg liver ROS,   Endo/Other  Hypothyroidism   Renal/GU Cr 1.46 K+ 4.3     Musculoskeletal   Abdominal   Peds  Hematology Hgb 13.8 Plt 164   Anesthesia Other Findings   Reproductive/Obstetrics                           Anesthesia Physical Anesthesia Plan  ASA: II  Anesthesia Plan: Spinal   Post-op Pain Management:    Induction: Intravenous  PONV Risk Score and Plan: Treatment may vary due to age or medical condition  Airway Management Planned:   Additional Equipment:   Intra-op Plan:   Post-operative Plan:   Informed Consent: I have reviewed the patients History and Physical, chart, labs and discussed the procedure including the risks, benefits and alternatives for the proposed anesthesia with the patient or authorized representative who has indicated his/her understanding and acceptance.     Dental advisory given  Plan Discussed with:   Anesthesia Plan Comments: (L hemi hip under Spinal. . D/W patient and Daughter Steward Dr[facility]) Pt daughter agrees to proceed.)       Anesthesia Quick Evaluation

## 2019-02-16 NOTE — ED Provider Notes (Addendum)
[facility]-EMERGENCY DEPT Provider Note: Lowella Dell, MD, FACEP  CSN: [id number] MRN: [id number] ARRIVAL: 02/16/19 at 0453 ROOM: WA13/WA13   CHIEF COMPLAINT  Hip Injury  Level 5 caveat: Dementia HISTORY OF PRESENT ILLNESS  02/16/19 4:57 AM Nicole [name] is a 85 y.o. female with dementia.  She reportedly fell yesterday afternoon in the bathroom at her living facility.  Apparently she was assisted to bed where she stayed until this morning.  She was complaining of pain and EMS was called.  EMS notes shortening and external rotation of the left lower extremity with pain on attempted movement of the left hip.  The patient is not able to give a history.   Past Medical History:  Diagnosis Date   Acute bronchitis    Allergic rhinitis, cause unspecified    Alzheimer's disease (HCC) 12/09/2015   Esophageal reflux    Essential and other specified forms of tremor    Insomnia, unspecified    Memory deficit 09/22/2014    Past Surgical History:  Procedure Laterality Date   bladder tack     cataract surgery Bilateral sept and october 2015   colon polyps     TOTAL ABDOMINAL HYSTERECTOMY W/ BILATERAL SALPINGOOPHORECTOMY      Family History  Problem Relation Age of Onset   Allergies Brother    Allergies Sister    Cancer Father    Tremor Father    Other Mother        fracture hip age 80   Cancer Brother    Tremor Brother    Cancer Brother    Heart disease Brother    Heart disease Brother     Social History   Tobacco Use   Smoking status: Never Smoker   Smokeless tobacco: Never Used  Substance Use Topics   Alcohol use: No   Drug use: No    Prior to Admission medications   Medication Sig Start Date End Date Taking? Authorizing Provider  acetaminophen (TYLENOL) 500 MG tablet Take 500 mg by mouth 2 (two) times daily as needed (For pain.).     [provider]  ALPRAZolam Prudy Feeler) 0.25 MG tablet Take 0.25 mg by mouth at bedtime as needed for anxiety.  One tab at 5 pm    [provider]  busPIRone (BUSPAR) 15 MG tablet Take 15 mg by mouth 2 (two) times daily.     [provider]  Calcium Citrate-Vitamin D (CITRACAL PETITES/VITAMIN D PO) Take 2 tablets by mouth daily.     [provider]  Cranberry 425 MG CAPS Take 425 mg by mouth daily.     [provider]  cycloSPORINE (RESTASIS) 0.05 % ophthalmic emulsion Place 1 drop into both eyes 2 (two) times daily.    [provider]  docusate sodium (COLACE) 100 MG capsule Take 100 mg by mouth 2 (two) times daily.     [provider]  donepezil (ARICEPT) 5 MG tablet Take 1 tablet (5 mg total) by mouth at bedtime. 06/28/18   [name], MD  fexofenadine (ALLEGRA) 180 MG tablet Take 90 mg by mouth daily.     [provider]  fluticasone (FLONASE) 50 MCG/ACT nasal spray Place 2 sprays into both nostrils daily.     [provider]  HYDROcodone-acetaminophen (NORCO/VICODIN) 5-325 MG tablet Take 0.5 tablets by mouth 2 (two) times daily.     [provider]  levothyroxine (SYNTHROID, LEVOTHROID) 75 MCG tablet Take 75 mcg by mouth daily before breakfast.  [provider]  Melatonin 5 MG TABS Take 5 mg by mouth at bedtime.    [provider]  memantine (NAMENDA) 10 MG tablet Take 10 mg by mouth 2 (two) times daily.  04/27/16   [provider]  mirtazapine (REMERON) 15 MG tablet Take 2 tablets (30 mg total) by mouth at bedtime. 12/11/17   [name], MD  OVER THE COUNTER MEDICATION Take 4,000 Units by mouth daily. Vitamin D3 2000 IU Softgel    [provider]  Polyethyl Glycol-Propyl Glycol (SYSTANE ULTRA OP) Place 1 drop into both eyes 2 (two) times daily.    [provider]  polyethylene glycol (MIRALAX / GLYCOLAX) packet Take 17 g by mouth daily. Mix in 8 ounces of water/juice and drink.    [provider]  senna (SENOKOT) 8.6 MG TABS tablet Take 1 tablet by mouth  every 3 (three) days.     [provider]  triamcinolone cream (KENALOG) 0.1 % Apply 1 application topically 2 (two) times daily. Applies to right ankle.    [provider]  trimethoprim (TRIMPEX) 100 MG tablet Take 100 mg by mouth daily. One tab po qhs    [provider]    Allergies Tramadol and Paxil [paroxetine hcl]   REVIEW OF SYSTEMS     PHYSICAL EXAMINATION  Initial Vital Signs Blood pressure (!) 163/83, pulse 79, temperature 98.3 F (36.8 C), temperature source Oral, resp. rate 12, SpO2 94 %.  Examination General: Well-developed, well-nourished female in no acute distress; appearance consistent with age of record HENT: normocephalic; atraumatic Eyes: pupils equal, round and reactive to light; extraocular muscles intact Neck: supple; nontender Heart: regular rate and rhythm Lungs: clear to auscultation bilaterally Abdomen: soft; nondistended; bowel sounds present Extremities: Pulses normal; left lower extremity shortened and externally rotated; pain on passive movement of left hip; trace edema of lower legs Neurologic: Awake, tremulous; confused; noted to move all extremities Skin: Warm and dry Psychiatric: Grimacing   RESULTS  Summary of this visit's results, reviewed by myself:   EKG Interpretation  Date/Time:  Saturday February 16 2019 05:06:25 EDT Ventricular Rate:  95 PR Interval:    QRS Duration: 101 QT Interval:  401 QTC Calculation: 463 R Axis:   -68 Text Interpretation:  Normal sinus rhythm Artifact Confirmed by Kohei Antonellis ([id number]) on 02/16/2019 5:10:42 AM      Laboratory Studies: Results for orders placed or performed during the hospital encounter of 02/16/19 (from the past 24 hour(s))  CBC with Differential/Platelet     Status: Abnormal   Collection Time: 02/16/19  5:16 AM  Result Value Ref Range   WBC 11.2 (H) 4.0 - 10.5 K/uL   RBC 4.22 3.87 - 5.11 MIL/uL   Hemoglobin 13.8 12.0 - 15.0 g/dL   HCT 62.1 30.8 - 65.7 %    MCV 100.0 80.0 - 100.0 fL   MCH 32.7 26.0 - 34.0 pg   MCHC 32.7 30.0 - 36.0 g/dL   RDW 84.6 96.2 - 95.2 %   Platelets 164 150 - 400 K/uL   nRBC 0.0 0.0 - 0.2 %   Neutrophils Relative % 85 %   Neutro Abs 9.5 (H) 1.7 - 7.7 K/uL   Lymphocytes Relative 7 %   Lymphs Abs 0.8 0.7 - 4.0 K/uL   Monocytes Relative 7 %   Monocytes Absolute 0.8 0.1 - 1.0 K/uL   Eosinophils Relative 1 %   Eosinophils Absolute 0.1 0.0 - 0.5 K/uL   Basophils Relative 0 %  Basophils Absolute 0.0 0.0 - 0.1 K/uL   Immature Granulocytes 0 %   Abs Immature Granulocytes 0.05 0.00 - 0.07 K/uL  Basic metabolic panel     Status: Abnormal   Collection Time: 02/16/19  5:16 AM  Result Value Ref Range   Sodium 138 135 - 145 mmol/L   Potassium 4.3 3.5 - 5.1 mmol/L   Chloride 104 98 - 111 mmol/L   CO2 26 22 - 32 mmol/L   Glucose, Bld 137 (H) 70 - 99 mg/dL   BUN 30 (H) 8 - 23 mg/dL   Creatinine, Ser 8.67 (H) 0.44 - 1.00 mg/dL   Calcium 9.3 8.9 - 61.9 mg/dL   GFR calc non Af Amer 33 (L) >60 mL/min   GFR calc Af Amer 38 (L) >60 mL/min   Anion gap 8 5 - 15   Imaging Studies: Dg Chest Port 1 View  Result Date: 02/16/2019 CLINICAL DATA:  Hip fracture. Pt arrives via [facility] from [facility]. Pt fell yesterday afternoon in the restroom. Facility noticed that she was complaining of pain this morning and call for transport. Pt demented, but alert to normal per staff. EXAM: PORTABLE CHEST 1 VIEW COMPARISON:  01/07/2014 FINDINGS: Cardiac silhouette is normal in size. No mediastinal or hilar masses. No evidence of adenopathy. Clear lungs.  No pleural effusion or pneumothorax. Skeletal structures are grossly intact. IMPRESSION: No active disease. Electronically Signed   By: Amie Portland M.D.   On: 02/16/2019 05:50   Dg Hip Unilat W Or Wo Pelvis 2-3 Views Left  Result Date: 02/16/2019 CLINICAL DATA:  Pt arrives via [facility] from [facility]. Pt fell yesterday afternoon in the restroom. Facility noticed that she was complaining of pain this  morning and call for transport. Pt demented, but alert to normal per staff. EXAM: DG HIP (WITH OR WITHOUT PELVIS) 2-3V LEFT COMPARISON:  None. FINDINGS: Displaced fracture of the left femoral neck. Distal fracture component has displaced superiorly by approximately 1 cm. There is varus angulation. No other fractures. No bone lesion. Hip joints, SI joints and symphysis pubis are normally aligned. Skeletal structures are demineralized. IMPRESSION: Mildly displaced, varus angulated left femoral neck fracture. No dislocation. Electronically Signed   By: Amie Portland M.D.   On: 02/16/2019 05:50    ED COURSE and MDM  Nursing notes and initial vitals signs, including pulse oximetry, reviewed.  Vitals:   02/16/19 0454 02/16/19 0500  BP: (!) 163/83   Pulse: 79   Resp: 12   Temp: 98.3 F (36.8 C)   TempSrc: Oral   SpO2: 94% 94%   6:10 AM [facility] hospitalist consulted for admission.  Dr. Roda Shutters of orthopedics consulted for her fracture.  PROCEDURES    ED DIAGNOSES     ICD-10-CM   1. Closed fracture of neck of left femur, initial encounter (HCC) S72.002A   2. Fall at nursing home, initial encounter W19.XXXA    Y92.129        Paula Libra, MD 02/16/19 5093    Paula Libra, MD 02/16/19 [phone]

## 2019-02-17 ENCOUNTER — Other Ambulatory Visit: Payer: Self-pay

## 2019-02-17 DIAGNOSIS — Z8781 Personal history of (healed) traumatic fracture: Secondary | ICD-10-CM

## 2019-02-17 LAB — BASIC METABOLIC PANEL
Anion gap: 7 (ref 5–15)
BUN: 22 mg/dL (ref 8–23)
CO2: 26 mmol/L (ref 22–32)
Calcium: 8.6 mg/dL — ABNORMAL LOW (ref 8.9–10.3)
Chloride: 106 mmol/L (ref 98–111)
Creatinine, Ser: 1.55 mg/dL — ABNORMAL HIGH (ref 0.44–1.00)
GFR calc Af Amer: 36 mL/min — ABNORMAL LOW (ref >60)
GFR calc non Af Amer: 31 mL/min — ABNORMAL LOW (ref >60)
Glucose, Bld: 123 mg/dL — ABNORMAL HIGH (ref 70–99)
Potassium: 4.5 mmol/L (ref 3.5–5.1)
Sodium: 139 mmol/L (ref 135–145)

## 2019-02-17 LAB — CBC
HCT: 34.4 % — ABNORMAL LOW (ref 36.0–46.0)
Hemoglobin: 11.1 g/dL — ABNORMAL LOW (ref 12.0–15.0)
MCH: 32.8 pg (ref 26.0–34.0)
MCHC: 32.3 g/dL (ref 30.0–36.0)
MCV: 101.8 fL — ABNORMAL HIGH (ref 80.0–100.0)
Platelets: 125 10*3/uL — ABNORMAL LOW (ref 150–400)
RBC: 3.38 MIL/uL — ABNORMAL LOW (ref 3.87–5.11)
RDW: 12.9 % (ref 11.5–15.5)
WBC: 10.2 10*3/uL (ref 4.0–10.5)
nRBC: 0 % (ref 0.0–0.2)

## 2019-02-17 MED ORDER — LIP MEDEX EX OINT
TOPICAL_OINTMENT | CUTANEOUS | Status: AC
  Filled 2019-02-17: qty 7

## 2019-02-17 NOTE — NC FL2 (Signed)
[place] MEDICAID FL2 LEVEL OF CARE SCREENING TOOL     IDENTIFICATION  Patient Name: Nicole [name] Birthdate: 1934/10/23 Sex: female Admission Date (Current Location): 02/16/2019  [facility] and [place] Number:  Producer, television/film/video and Address:  [facility],  [address], [place] [address]      Provider Number: [id number]  Attending Physician Name and Address:  Tyrone Nine, MD  Relative Name and Phone Number:       Current Level of Care: Hospital Recommended Level of Care: Skilled Nursing Facility Prior Approval Number:    Date Approved/Denied:   PASRR Number: Pending  Discharge Plan: SNF    Current Diagnoses: Patient Active Problem List   Diagnosis Date Noted  . Left displaced femoral neck fracture (HCC) 02/16/2019  . S/p left hip fracture 02/16/2019  . Alzheimer's disease (HCC) 12/09/2015  . Tremor, essential 11/04/2015  . Depression 12/23/2014  . Generalized anxiety disorder 12/23/2014  . Memory deficit 09/22/2014  . TREMOR, ESSENTIAL 04/28/2008  . ASTHMATIC BRONCHITIS, ACUTE 04/28/2008  . ALLERGIC RHINITIS 04/28/2008  . ESOPHAGEAL REFLUX 04/28/2008  . INSOMNIA 04/28/2008    Orientation RESPIRATION BLADDER Height & Weight     Self  Normal Continent Weight: 169 lb 12.1 oz (77 kg)(per old note) Height:     BEHAVIORAL SYMPTOMS/MOOD NEUROLOGICAL BOWEL NUTRITION STATUS      Continent Diet(see DC summary)  AMBULATORY STATUS COMMUNICATION OF NEEDS Skin   Extensive Assist Verbally Surgical wounds(closed left hip, silver dressings)                       Personal Care Assistance Level of Assistance  Bathing, Feeding, Dressing Bathing Assistance: Limited assistance Feeding assistance: Limited assistance Dressing Assistance: Limited assistance     Functional Limitations Info  Sight, Hearing, Speech Sight Info: Adequate Hearing Info: Adequate Speech Info: Adequate    SPECIAL CARE FACTORS FREQUENCY  PT (By licensed PT), OT (By  licensed OT)     PT Frequency: 5x/wk OT Frequency: 5x/wk            Contractures Contractures Info: Not present    Additional Factors Info  Code Status, Allergies, Psychotropic Code Status Info: DNR Allergies Info: Tramadol, Paxil Paroxetine Hcl Psychotropic Info: Xanax 0.25mg  every night; Buspar 15mg  daily before breakfast; Aricept 5mg  daily at bed; Remeron 30mg  daily at bed         Current Medications (02/17/2019):  This is the current hospital active medication list Current Facility-Administered Medications  Medication Dose Route Frequency Provider Last Rate Last Dose  . 0.9 %  sodium chloride infusion   Intravenous Continuous Tarry Kos, MD 75 mL/hr at 02/17/19 [phone]    . acetaminophen (TYLENOL) tablet 325-650 mg  325-650 mg Oral Q6H PRN Tarry Kos, MD      . ALPRAZolam Prudy Feeler) tablet 0.25 mg  0.25 mg Oral QPM Tarry Kos, MD   0.25 mg at 02/16/19 1740  . alum & mag hydroxide-simeth (MAALOX/MYLANTA) 200-200-20 MG/5ML suspension 30 mL  30 mL Oral Q4H PRN Tarry Kos, MD      . busPIRone (BUSPAR) tablet 15 mg  15 mg Oral QAC breakfast Dow Adolph N, DO   15 mg at 02/17/19 3662  . busPIRone (BUSPAR) tablet 25 mg  25 mg Oral Q1400 Tarry Kos, MD   25 mg at 02/17/19 1410  . cholecalciferol (VITAMIN D3) tablet 4,000 Units  4,000 Units Oral Daily Tarry Kos, MD   4,000 Units  at 02/17/19 1015  . docusate sodium (COLACE) capsule 100 mg  100 mg Oral BID Tarry Kos, MD   100 mg at 02/17/19 1015  . donepezil (ARICEPT) tablet 5 mg  5 mg Oral QHS Tarry Kos, MD   5 mg at 02/16/19 2131  . enoxaparin (LOVENOX) injection 30 mg  30 mg Subcutaneous Q24H Tarry Kos, MD   30 mg at 02/17/19 0804  . fentaNYL (SUBLIMAZE) injection 25 mcg  25 mcg Intravenous Q2H PRN Tarry Kos, MD   25 mcg at 02/16/19 0517  . hydrALAZINE (APRESOLINE) injection 10 mg  10 mg Intravenous Q6H PRN Dow Adolph N, DO      . HYDROcodone-acetaminophen (NORCO) 7.5-325 MG per tablet 1-2 tablet  1-2  tablet Oral Q4H PRN Tarry Kos, MD      . HYDROcodone-acetaminophen (NORCO/VICODIN) 5-325 MG per tablet 0.5 tablet  0.5 tablet Oral BID Tarry Kos, MD   0.5 tablet at 02/16/19 2130  . HYDROcodone-acetaminophen (NORCO/VICODIN) 5-325 MG per tablet 1-2 tablet  1-2 tablet Oral Q4H PRN Tarry Kos, MD   1 tablet at 02/17/19 1410  . lactated ringers infusion   Intravenous Continuous Dow Adolph N, DO      . levothyroxine (SYNTHROID, LEVOTHROID) tablet 75 mcg  75 mcg Oral Q0600 Tarry Kos, MD   75 mcg at 02/17/19 0700  . lip balm (CARMEX) ointment           . loratadine (CLARITIN) tablet 10 mg  10 mg Oral Daily Tarry Kos, MD   10 mg at 02/17/19 1015  . magnesium citrate solution 1 Bottle  1 Bottle Oral Once PRN Tarry Kos, MD      . Melatonin TABS 5 mg  5 mg Oral QHS Tarry Kos, MD   5 mg at 02/16/19 2131  . memantine (NAMENDA) tablet 10 mg  10 mg Oral BID Tarry Kos, MD   10 mg at 02/17/19 1015  . menthol-cetylpyridinium (CEPACOL) lozenge 3 mg  1 lozenge Oral PRN Tarry Kos, MD       Or  . phenol (CHLORASEPTIC) mouth spray 1 spray  1 spray Mouth/Throat PRN Tarry Kos, MD      . methocarbamol (ROBAXIN) tablet 500 mg  500 mg Oral Q6H PRN Tarry Kos, MD   500 mg at 02/17/19 0005   Or  . methocarbamol (ROBAXIN) 500 mg in dextrose 5 % 50 mL IVPB  500 mg Intravenous Q6H PRN Tarry Kos, MD      . mirtazapine (REMERON) tablet 30 mg  30 mg Oral QHS Tarry Kos, MD   30 mg at 02/16/19 2130  . morphine 2 MG/ML injection 1 mg  1 mg Intravenous Q2H PRN Tarry Kos, MD      . ondansetron [facility]) tablet 4 mg  4 mg Oral Q6H PRN Tarry Kos, MD       Or  . ondansetron [facility]) injection 4 mg  4 mg Intravenous Q6H PRN Tarry Kos, MD      . polyethylene glycol (MIRALAX / GLYCOLAX) packet 17 g  17 g Oral Daily Tarry Kos, MD   17 g at 02/17/19 1014  . polyethylene glycol (MIRALAX / GLYCOLAX) packet 17 g  17 g Oral Daily PRN Tarry Kos, MD      . senna (SENOKOT)  tablet 8.6 mg  1 tablet Oral Q72H Tarry Kos, MD   8.6 mg  at 02/17/19 1015  . sorbitol 70 % solution 30 mL  30 mL Oral Daily PRN Tarry KosXu, Naiping M, MD         Discharge Medications: Please see discharge summary for a list of discharge medications.  Relevant Imaging Results:  Relevant Lab Results:   Additional Information SS#: [id number]  Baldemar LenisElizabeth M Ariauna Farabee, LCSW

## 2019-02-17 NOTE — Progress Notes (Signed)
Subjective: 1 Day Post-Op Procedure(s) (LRB): ANTERIOR APPROACH HEMI HIP ATRTHROPLASY (Left) Patient reports pain as mild.   Left hip Mepilex dressing in place with scant drainage.  Neurovascular intact distally.   Objective: Vital signs in last 24 hours: Temp:  [97.4 F (36.3 C)-98.6 F (37 C)] 97.7 F (36.5 C) (04/05 0702) Pulse Rate:  [62-100] 79 (04/05 0702) Resp:  [16-22] 16 (04/05 0702) BP: (93-136)/(49-114) 100/60 (04/05 0702) SpO2:  [88 %-100 %] 88 % (04/05 0702) Weight:  [77 kg] 77 kg (04/04 1016)  Intake/Output from previous day: 04/04 0701 - 04/05 0700 In: 2751.9 [P.O.:300; I.V.:1662.2; IV Piggyback:789.7] Out: 1740 [Urine:1500; Blood:240] Intake/Output this shift: Total I/O In: 240 [P.O.:240] Out: -   Recent Labs    02/16/19 0516 02/17/19 0656  HGB 13.8 11.1*   Recent Labs    02/16/19 0516 02/17/19 0656  WBC 11.2* 10.2  RBC 4.22 3.38*  HCT 42.2 34.4*  PLT 164 125*   Recent Labs    02/16/19 0516 02/17/19 0656  NA 138 139  K 4.3 4.5  CL 104 106  CO2 26 26  BUN 30* 22  CREATININE 1.46* 1.55*  GLUCOSE 137* 123*  CALCIUM 9.3 8.6*   No results for input(s): LABPT, INR in the last 72 hours.  Left hip dressing with scant drainage.  Neurovascular intact Sensation intact distally Intact pulses distally Dorsiflexion/Plantar flexion intact Incision: scant drainage   Assessment/Plan: 1 Day Post-Op Procedure(s) (LRB): ANTERIOR APPROACH HEMI HIP ATRTHROPLASY (Left) Up with therapy  Hgb 11.1 (13.8) Weight bearing as tolerated  VTE risk- Lovenox Likely to SNF rehab this week.     Lazaro Arms, PA-C 02/17/2019, 9:02 AM  [facility] [phone]

## 2019-02-17 NOTE — TOC Initial Note (Signed)
Transition of Care [facility]) - Initial/Assessment Note    Patient Details  Name: Nicole [name] MRN: [id number] Date of Birth: November 07, 1934  Transition of Care [facility]) CM/SW Contact:    Baldemar Lenis, LCSW Phone Number: 02/17/2019, 3:33 PM  Clinical Narrative:   Patient from [facility] but they will be unable to take patient back with her current care requirements. Daughter agreeable to SNF, hopeful for [facility] or somewhere closer to [facility] where she lives. Daughter is hopeful that the facility will know how to handle patients with Alzheimer's, as well. CSW faxed out referral.                Expected Discharge Plan: Skilled Nursing Facility Barriers to Discharge: Continued Medical Work up, Designer, jewellery (PASRR), English as a second language teacher, SNF Pending bed offer   Patient Goals and CMS Choice Patient states their goals for this hospitalization and ongoing recovery are:: patient unable to participate in goal setting at this time CMS Medicare.gov Compare Post Acute Care list provided to:: Patient Represenative (must comment) Choice offered to / list presented to : Adult Children  Expected Discharge Plan and Services Expected Discharge Plan: Skilled Nursing Facility       Living arrangements for the past 2 months: Assisted Living Facility                          Prior Living Arrangements/Services Living arrangements for the past 2 months: Assisted Living Facility Lives with:: Facility Resident Patient language and need for interpreter reviewed:: No Do you feel safe going back to the place where you live?: Yes      Need for Family Participation in Patient Care: Yes (Comment) Care giver support system in place?: Yes (comment)   Criminal Activity/Legal Involvement Pertinent to Current Situation/Hospitalization: No - Comment as needed  Activities of Daily Living   ADL Screening (condition at time of admission) Patient's cognitive ability adequate to safely complete  daily activities?: No Is the patient deaf or have difficulty hearing?: No Does the patient have difficulty seeing, even when wearing glasses/contacts?: No Does the patient have difficulty concentrating, remembering, or making decisions?: Yes Patient able to express need for assistance with ADLs?: No Does the patient have difficulty dressing or bathing?: Yes Independently performs ADLs?: No Communication: Needs assistance Is this a change from baseline?: Change from baseline, expected to last >3 days Dressing (OT): Needs assistance Is this a change from baseline?: Change from baseline, expected to last >3 days Grooming: Needs assistance Is this a change from baseline?: Change from baseline, expected to last >3 days Feeding: Needs assistance Is this a change from baseline?: Change from baseline, expected to last >3 days Bathing: Needs assistance Is this a change from baseline?: Change from baseline, expected to last >3 days Toileting: Needs assistance Is this a change from baseline?: Change from baseline, expected to last >3days In/Out Bed: Needs assistance Is this a change from baseline?: Change from baseline, expected to last >3 days Walks in Home: Needs assistance Is this a change from baseline?: Change from baseline, expected to last >3 days Does the patient have difficulty walking or climbing stairs?: Yes Weakness of Legs: Left Weakness of Arms/Hands: None  Permission Sought/Granted Permission sought to share information with : Facility Medical sales representative, Family Supports Permission granted to share information with : Yes, Verbal Permission Granted  Share Information with NAME: Steward Drone  Permission granted to share info w AGENCY: SNF  Permission granted to share info  w Relationship: Daughter     Emotional Assessment Appearance:: Appears stated age Attitude/Demeanor/Rapport: Unable to Assess Affect (typically observed): Unable to Assess Orientation: : Oriented to  Self Alcohol / Substance Use: Not Applicable Psych Involvement: No (comment)  Admission diagnosis:  Closed fracture of neck of left femur, initial encounter (HCC) [S72.002A] Fall at nursing home, initial encounter [W19.Lorne Skeens, Y92.129] Patient Active Problem List   Diagnosis Date Noted  . Left displaced femoral neck fracture (HCC) 02/16/2019  . S/p left hip fracture 02/16/2019  . Alzheimer's disease (HCC) 12/09/2015  . Tremor, essential 11/04/2015  . Depression 12/23/2014  . Generalized anxiety disorder 12/23/2014  . Memory deficit 09/22/2014  . TREMOR, ESSENTIAL 04/28/2008  . ASTHMATIC BRONCHITIS, ACUTE 04/28/2008  . ALLERGIC RHINITIS 04/28/2008  . ESOPHAGEAL REFLUX 04/28/2008  . INSOMNIA 04/28/2008   PCP:  [facility] Pharmacy:  No Pharmacies Listed    Social Determinants of Health (SDOH) Interventions    Readmission Risk Interventions No flowsheet data found.

## 2019-02-17 NOTE — Evaluation (Signed)
Occupational Therapy Evaluation Patient Details Name: Nicole [name] MRN: [id number] DOB: 15-Jul-1934 Today's Date: 02/17/2019    History of Present Illness 85 y.o. female who presents with left hip fracture s/p mechanical fall 2 nights prior to admission. The patient endorses severe pain in the left hip and back,  unable to ambulate so she was brought to the ED on 02/16/19; pt with L hip fx, s/p DA hemiarthroplasty per Dr Roda Shutters on 02/17/19; PMHx: dementia, essential tremor   Clinical Impression   Pt admitted with L hip fracture. Pt currently with functional limitations due to the deficits listed below (see OT Problem List).  Pt will benefit from skilled OT to increase their safety and independence with ADL and functional mobility for ADL to facilitate discharge to venue listed below.    Follow Up Recommendations  SNF    Equipment Recommendations  None recommended by OT    Recommendations for Other Services       Precautions / Restrictions Precautions Precautions: Fall Restrictions Weight Bearing Restrictions: No Other Position/Activity Restrictions: WBAT      Mobility Bed Mobility Overal bed mobility: Needs Assistance Bed Mobility: Supine to Sit     Supine to sit: Mod assist;HOB elevated     General bed mobility comments: assist with LEs and trunk, multi-modal cues to self assist  Transfers Overall transfer level: Needs assistance Equipment used: Rolling walker (2 wheeled) Transfers: Sit to/from [employer] Sit to Stand: Min assist;+2 safety/equipment;+2 physical assistance Stand pivot transfers: +2 physical assistance;Min assist;+2 safety/equipment       General transfer comment: incr time, assist to rise and stabilize, cues for hand placement    Balance Overall balance assessment: History of Falls;Needs assistance;Mild deficits observed, not formally tested Sitting-balance support: Bilateral upper extremity supported;Feet supported Sitting balance-Leahy  Scale: Fair Sitting balance - Comments: fair once in midline, incr time needed, initial posterior LOB     Standing balance-Leahy Scale: Poor Standing balance comment: reliant on UEs and external support                           ADL either performed or assessed with clinical judgement         Vision Patient Visual Report: No change from baseline              Pertinent Vitals/Pain Pain Assessment: Faces Faces Pain Scale: Hurts even more Pain Location: L hip Pain Descriptors / Indicators: Sore;Grimacing;Guarding Pain Intervention(s): Limited activity within patient's tolerance;Monitored during session;Repositioned     Hand Dominance     Extremity/Trunk Assessment Upper Extremity Assessment Upper Extremity Assessment: Generalized weakness   Lower Extremity Assessment Lower Extremity Assessment: LLE deficits/detail;Generalized weakness LLE Deficits / Details: testing limited d/t pain LLE: Unable to fully assess due to pain       Communication Communication Communication: No difficulties   Cognition Arousal/Alertness: Awake/alert Behavior During Therapy: Flat affect;Anxious Overall Cognitive Status: History of cognitive impairments - at baseline                                 General Comments: baseline dementia, follows one step commands but is easily distracted    General Comments               Home Living Family/patient expects to be discharged to:: Skilled nursing facility  Additional Comments: pt is from [facility]      Prior Functioning/Environment          Comments: per pt she ambulated without device; per obs this appears accurate        OT Problem List: Decreased strength;Decreased safety awareness;Decreased activity tolerance;Impaired balance (sitting and/or standing)      OT Treatment/Interventions: Self-care/ADL training;Patient/family education;Therapeutic  activities    OT Goals(Current goals can be found in the care plan section) Acute Rehab OT Goals Patient Stated Goal: none stated OT Goal Formulation: With patient Time For Goal Achievement: 03/03/19  OT Frequency: Min 2X/week   Barriers to D/C:               AM-PAC OT "6 Clicks" Daily Activity     Outcome Measure Help from another person eating meals?: A Little Help from another person taking care of personal grooming?: A Little Help from another person toileting, which includes using toliet, bedpan, or urinal?: A Lot Help from another person bathing (including washing, rinsing, drying)?: A Lot   Help from another person to put on and taking off regular lower body clothing?: A Lot 6 Click Score: 12   End of Session Equipment Utilized During Treatment: Rolling walker;Gait belt Nurse Communication: Mobility status  Activity Tolerance: Patient tolerated treatment well Patient left: in chair;with call bell/phone within reach  OT Visit Diagnosis: Unsteadiness on feet (R26.81);Muscle weakness (generalized) (M62.81)                Time: 7510-2585 OT Time Calculation (min): 14 min Charges:  OT General Charges $OT Visit: 1 Visit OT Evaluation $OT Eval Moderate Complexity: 1 Mod  Lise Auer, OT Acute Rehabilitation Services [phone] Office- [phone]     Sonika Levins, Karin Golden D 02/17/2019, 2:05 PM

## 2019-02-17 NOTE — Evaluation (Signed)
Physical Therapy Evaluation Patient Details Name: Nicole [name] MRN: [id number] DOB: 09-May-1934 Today's Date: 02/17/2019   History of Present Illness  85 y.o. female who presents with left hip fracture s/p mechanical fall 2 nights prior to admission. The patient endorses severe pain in the left hip and back,  unable to ambulate so she was brought to the ED on 02/16/19; pt with L hip fx, s/p DA hemiarthroplasty per Dr Roda Shutters on 02/17/19; PMHx: dementia, essential tremor  Clinical Impression  Pt admitted with above diagnosis. Pt currently with functional limitations due to the deficits listed below (see PT Problem List).  Pt will benefit from SNF post acute; will continue to follow   Pt will benefit from skilled PT to increase their independence and safety with mobility to allow discharge to the venue listed below.       Follow Up Recommendations SNF    Equipment Recommendations  None recommended by PT    Recommendations for Other Services       Precautions / Restrictions Precautions Precautions: Fall Restrictions Weight Bearing Restrictions: No Other Position/Activity Restrictions: WBAT      Mobility  Bed Mobility Overal bed mobility: Needs Assistance Bed Mobility: Supine to Sit     Supine to sit: Mod assist;HOB elevated     General bed mobility comments: assist with LEs and trunk, multi-modal cues to self assist  Transfers Overall transfer level: Needs assistance Equipment used: Rolling walker (2 wheeled) Transfers: Sit to/from [employer] Transfers Sit to Stand: Min assist;+2 safety/equipment;+2 physical assistance Stand pivot transfers: +2 physical assistance;Min assist;+2 safety/equipment       General transfer comment: incr time, assist to rise and stabilize, cues for hand placement  Ambulation/Gait             General Gait Details: pivotal steps--fwd and back to chair with RW min +2 assist; multi-modal cues for sequence and to keep hands on RW, assist  needed throughout with  balance   Stairs            Wheelchair Mobility    Modified Rankin (Stroke Patients Only)       Balance Overall balance assessment: History of Falls;Needs assistance Sitting-balance support: Bilateral upper extremity supported;Feet supported Sitting balance-Leahy Scale: Fair Sitting balance - Comments: fair once in midline, incr time needed, initial posterior LOB     Standing balance-Leahy Scale: Poor Standing balance comment: reliant on UEs and external support                             Pertinent Vitals/Pain Pain Assessment: Faces Faces Pain Scale: Hurts even more Pain Location: L hip Pain Descriptors / Indicators: Sore;Grimacing;Guarding Pain Intervention(s): Limited activity within patient's tolerance;Monitored during session;Repositioned    Home Living Family/patient expects to be discharged to:: Skilled nursing facility                 Additional Comments: pt is from [facility]    Prior Function           Comments: per pt she ambulated without device; per obs this appears accurate     Hand Dominance        Extremity/Trunk Assessment   Upper Extremity Assessment Upper Extremity Assessment: Defer to OT evaluation    Lower Extremity Assessment Lower Extremity Assessment: LLE deficits/detail;Generalized weakness LLE Deficits / Details: testing limited d/t pain LLE: Unable to fully assess due to pain       Communication  Communication: No difficulties  Cognition Arousal/Alertness: Awake/alert Behavior During Therapy: Flat affect;Anxious Overall Cognitive Status: History of cognitive impairments - at baseline                                 General Comments: baseline dementia, follows one step commands but is easily distracted       General Comments      Exercises     Assessment/Plan    PT Assessment Patient needs continued PT services  PT Problem List Decreased  strength;Decreased activity tolerance;Decreased range of motion;Decreased balance;Decreased knowledge of use of DME;Decreased cognition;Pain;Decreased mobility       PT Treatment Interventions DME instruction;Gait training;Functional mobility training;Therapeutic activities;Therapeutic exercise;Patient/family education    PT Goals (Current goals can be found in the Care Plan section)  Acute Rehab PT Goals Patient Stated Goal: none stated PT Goal Formulation: Patient unable to participate in goal setting Time For Goal Achievement: 03/03/19 Potential to Achieve Goals: Good    Frequency Min 3X/week   Barriers to discharge        Co-evaluation               AM-PAC PT "6 Clicks" Mobility  Outcome Measure Help needed turning from your back to your side while in a flat bed without using bedrails?: A Lot Help needed moving from lying on your back to sitting on the side of a flat bed without using bedrails?: A Lot Help needed moving to and from a bed to a chair (including a wheelchair)?: A Lot Help needed standing up from a chair using your arms (e.g., wheelchair or bedside chair)?: A Lot Help needed to walk in hospital room?: A Lot Help needed climbing 3-5 steps with a railing? : Total 6 Click Score: 11    End of Session Equipment Utilized During Treatment: Gait belt Activity Tolerance: Patient limited by pain;Patient limited by fatigue Patient left: in chair;with chair alarm set;with call bell/phone within reach Nurse Communication: Mobility status PT Visit Diagnosis: Muscle weakness (generalized) (M62.81);Unsteadiness on feet (R26.81);History of falling (Z91.81)    Time: 2725-3664 PT Time Calculation (min) (ACUTE ONLY): 20 min   Charges:   PT Evaluation $PT Eval Low Complexity: 1 Low          Drucilla Chalet, PT  Pager: [phone] Acute Rehab Dept [facility]): [phone]   02/17/2019   [facility] 02/17/2019, 1:16 PM

## 2019-02-17 NOTE — Progress Notes (Signed)
PROGRESS NOTE  Nicole [name]  XMI:[id number] DOB: 1934-08-20 DOA: 02/16/2019 PCP: [facility]  Brief Narrative: Nicole [name] is a 85 y.o. female with medical history significant for dementia, depression/anxiety, hypothyroidism, CKD 3, chronic constipation who presented to [facility] from [facility] after a fall that occurred yesterday afternoon.  Patient has a history of dementia and is unable to provide a history.  History is mainly obtained from ED physician and medical records.  Patient was in her usual state of health prior to this.  Earlier this morning staff noticed patient was complaining of left hip pain.  EMS called and patient was transferred to ED for further evaluation.  X-ray of the left hip reveals fracture mildly displaced varus angulated left femoral neck fracture no dislocation. TRH asked to admit for left hip fracture.  Vital signs remarkable for uncontrolled hypertension and tachycardia.  Lab studies remarkable for mildly elevated WBC 11.2K.    Assessment & Plan: Principal Problem:   Left displaced femoral neck fracture (HCC) Active Problems:   S/p left hip fracture  Left hip fracture s/p fall now s/p anterior approach left hip hemiarthroplasty: - DVT ppx is lovenox per orthopedic surgery - Pain control per orthopedic surgery - Continue therapy efforts at SNF  Acute blood loss postoperative anemia:  - Monitor in AM.   HTN: Well-controlled - Continue current management  Thrombocytopenia: Mild.  - Monitor in AM.   Stage III CKD: Near baseline - Renally dose medications, avoid nephrotoxins as able - IVF  Leukocytosis: Reactive, resolved.   Hypothyroidism:  - Continue synthroid  Dementia:  - Continue aricept, dispo to SNF planned  Chronic depression/anxiety:  - Continue home medications and low dose xanax  DVT prophylaxis: Lovenox Code Status: DNR Family Communication: None at bedside Disposition Plan: SNF 4/6  Consultants:    Orthopedics  Procedures:   Left THA 4/4  Antimicrobials:  Perioperative vancomycin, ancef  Subjective: Pain is controlled, denies shortness of breath, cough, chest pain  Objective: Vitals:   02/16/19 2323 02/17/19 0047 02/17/19 0702 02/17/19 1412  BP: 117/72 136/61 100/60 127/61  Pulse: 95 100 79 (!) 102  Resp: 16 16 16 18   Temp: 98.5 F (36.9 C) 97.9 F (36.6 C) 97.7 F (36.5 C) (!) 97.4 F (36.3 C)  TempSrc: Oral Oral Oral Oral  SpO2: 100% 94% (!) 88% 92%  Weight:        Intake/Output Summary (Last 24 hours) at 02/17/2019 1534 Last data filed at 02/17/2019 0849 Gross per 24 hour  Intake 1991.88 ml  Output 800 ml  Net 1191.88 ml   Filed Weights   02/16/19 1016  Weight: 77 kg    Gen: Frail elderly female in no distress  Pulm: Non-labored breathing. Clear to auscultation bilaterally.  CV: Regular rate and rhythm. No murmur, rub, or gallop. No JVD, no pedal edema. GI: Abdomen soft, non-tender, non-distended, with normoactive bowel sounds. No organomegaly or masses felt. Ext: Warm, no deformity, LLE with soft thigh compartment, distal strength and sensation intact. +DP pulse. Skin: Dressing c/d/i, otherwise no rashes, lesions or ulcers Neuro: Alert, not oriented. No focal neurological deficits on limited exam Psych: Judgement and insight appear impaired. Mood & affect appropriate.   Data Reviewed: I have personally reviewed following labs and imaging studies  CBC: Recent Labs  Lab 02/16/19 0516 02/17/19 0656  WBC 11.2* 10.2  NEUTROABS 9.5*  --   HGB 13.8 11.1*  HCT 42.2 34.4*  MCV 100.0 101.8*  PLT 164 125*  Basic Metabolic Panel: Recent Labs  Lab 02/16/19 0516 02/17/19 0656  NA 138 139  K 4.3 4.5  CL 104 106  CO2 26 26  GLUCOSE 137* 123*  BUN 30* 22  CREATININE 1.46* 1.55*  CALCIUM 9.3 8.6*   GFR: Estimated Creatinine Clearance: 27 mL/min (A) (by C-G formula based on SCr of 1.55 mg/dL (H)). Liver Function Tests: No results for input(s): AST,  ALT, ALKPHOS, BILITOT, PROT, ALBUMIN in the last 168 hours. No results for input(s): LIPASE, AMYLASE in the last 168 hours. No results for input(s): AMMONIA in the last 168 hours. Coagulation Profile: No results for input(s): INR, PROTIME in the last 168 hours. Cardiac Enzymes: No results for input(s): CKTOTAL, CKMB, CKMBINDEX, TROPONINI in the last 168 hours. BNP (last 3 results) No results for input(s): PROBNP in the last 8760 hours. HbA1C: No results for input(s): HGBA1C in the last 72 hours. CBG: No results for input(s): GLUCAP in the last 168 hours. Lipid Profile: No results for input(s): CHOL, HDL, LDLCALC, TRIG, CHOLHDL, LDLDIRECT in the last 72 hours. Thyroid Function Tests: No results for input(s): TSH, T4TOTAL, FREET4, T3FREE, THYROIDAB in the last 72 hours. Anemia Panel: No results for input(s): VITAMINB12, FOLATE, FERRITIN, TIBC, IRON, RETICCTPCT in the last 72 hours. Urine analysis:    Component Value Date/Time   COLORURINE YELLOW 07/25/2018 1025   APPEARANCEUR HAZY (A) 07/25/2018 1025   LABSPEC 1.011 07/25/2018 1025   PHURINE 6.0 07/25/2018 1025   GLUCOSEU NEGATIVE 07/25/2018 1025   HGBUR MODERATE (A) 07/25/2018 1025   BILIRUBINUR NEGATIVE 07/25/2018 1025   KETONESUR NEGATIVE 07/25/2018 1025   PROTEINUR NEGATIVE 07/25/2018 1025   UROBILINOGEN 0.2 06/28/2014 1156   NITRITE NEGATIVE 07/25/2018 1025   LEUKOCYTESUR SMALL (A) 07/25/2018 1025   No results found for this or any previous visit (from the past 240 hour(s)).    Radiology Studies: Pelvis Portable  Result Date: 02/16/2019 CLINICAL DATA:  Left hip arthroplasty EXAM: PORTABLE PELVIS 1-2 VIEWS COMPARISON:  Fluoroscopy from earlier today FINDINGS: Left hip arthroplasty that is located in the frontal projection. No evidence of periprosthetic fracture. IMPRESSION: Unremarkable left hip arthroplasty. Electronically Signed   By: Marnee Spring M.D.   On: 02/16/2019 13:58   Dg Chest Port 1 View  Result Date:  02/16/2019 CLINICAL DATA:  Hip fracture. Pt arrives via [facility] from [facility]. Pt fell yesterday afternoon in the restroom. Facility noticed that she was complaining of pain this morning and call for transport. Pt demented, but alert to normal per staff. EXAM: PORTABLE CHEST 1 VIEW COMPARISON:  01/07/2014 FINDINGS: Cardiac silhouette is normal in size. No mediastinal or hilar masses. No evidence of adenopathy. Clear lungs.  No pleural effusion or pneumothorax. Skeletal structures are grossly intact. IMPRESSION: No active disease. Electronically Signed   By: Amie Portland M.D.   On: 02/16/2019 05:50   Dg C-arm 1-60 Min-no Report  Result Date: 02/16/2019 Fluoroscopy was utilized by the requesting physician.  No radiographic interpretation.   Dg Hip Operative Unilat W Or W/o Pelvis Left  Result Date: 02/16/2019 CLINICAL DATA:  Post left total hip replacement. EXAM: OPERATIVE LEFT HIP (WITH PELVIS IF PERFORMED) 2 VIEWS TECHNIQUE: Fluoroscopic spot image(s) were submitted for interpretation post-operatively. FLUOROSCOPY TIME:  7 seconds (0.7 mGy) COMPARISON:  Left hip radiographs-earlier same day FINDINGS: Post bipolar left hip replacement. Alignment appears anatomic given AP projection. No definite fracture or dislocation. There is a minimal amount of expected subcutaneous emphysema about the operative site. No radiopaque foreign body. IMPRESSION:  Post left bipolar hip replacement without evidence of complication. Electronically Signed   By: Simonne ComeJohn  Watts M.D.   On: 02/16/2019 12:07   Dg Hip Unilat W Or Wo Pelvis 2-3 Views Left  Result Date: 02/16/2019 CLINICAL DATA:  Pt arrives via [facility] from [facility]. Pt fell yesterday afternoon in the restroom. Facility noticed that she was complaining of pain this morning and call for transport. Pt demented, but alert to normal per staff. EXAM: DG HIP (WITH OR WITHOUT PELVIS) 2-3V LEFT COMPARISON:  None. FINDINGS: Displaced fracture of the left femoral neck. Distal  fracture component has displaced superiorly by approximately 1 cm. There is varus angulation. No other fractures. No bone lesion. Hip joints, SI joints and symphysis pubis are normally aligned. Skeletal structures are demineralized. IMPRESSION: Mildly displaced, varus angulated left femoral neck fracture. No dislocation. Electronically Signed   By: Amie Portlandavid  Ormond M.D.   On: 02/16/2019 05:50    Scheduled Meds: . ALPRAZolam  0.25 mg Oral QPM  . busPIRone  15 mg Oral QAC breakfast  . busPIRone  25 mg Oral Q1400  . cholecalciferol  4,000 Units Oral Daily  . docusate sodium  100 mg Oral BID  . donepezil  5 mg Oral QHS  . enoxaparin (LOVENOX) injection  30 mg Subcutaneous Q24H  . HYDROcodone-acetaminophen  0.5 tablet Oral BID  . levothyroxine  75 mcg Oral Q0600  . lip balm      . loratadine  10 mg Oral Daily  . Melatonin  5 mg Oral QHS  . memantine  10 mg Oral BID  . mirtazapine  30 mg Oral QHS  . polyethylene glycol  17 g Oral Daily  . senna  1 tablet Oral Q72H   Continuous Infusions: . sodium chloride 75 mL/hr at 02/17/19 0619  . lactated ringers    . methocarbamol (ROBAXIN) IV       LOS: 1 day   Time spent: 25 minutes.  Tyrone Nineyan B Juaquin Ludington, MD [facility] [email or web address] Password [email or web address] 02/17/2019, 3:34 PM

## 2019-02-18 ENCOUNTER — Encounter (HOSPITAL_COMMUNITY): Payer: Self-pay | Admitting: Orthopaedic Surgery

## 2019-02-18 LAB — CBC
HCT: 33.9 % — ABNORMAL LOW (ref 36.0–46.0)
Hemoglobin: 10.6 g/dL — ABNORMAL LOW (ref 12.0–15.0)
MCH: 32.2 pg (ref 26.0–34.0)
MCHC: 31.3 g/dL (ref 30.0–36.0)
MCV: 103 fL — ABNORMAL HIGH (ref 80.0–100.0)
Platelets: 132 10*3/uL — ABNORMAL LOW (ref 150–400)
RBC: 3.29 MIL/uL — ABNORMAL LOW (ref 3.87–5.11)
RDW: 13.2 % (ref 11.5–15.5)
WBC: 11.1 10*3/uL — ABNORMAL HIGH (ref 4.0–10.5)
nRBC: 0 % (ref 0.0–0.2)

## 2019-02-18 LAB — BASIC METABOLIC PANEL
Anion gap: 7 (ref 5–15)
BUN: 22 mg/dL (ref 8–23)
CO2: 24 mmol/L (ref 22–32)
Calcium: 8.8 mg/dL — ABNORMAL LOW (ref 8.9–10.3)
Chloride: 107 mmol/L (ref 98–111)
Creatinine, Ser: 1.37 mg/dL — ABNORMAL HIGH (ref 0.44–1.00)
GFR calc Af Amer: 41 mL/min — ABNORMAL LOW (ref >60)
GFR calc non Af Amer: 36 mL/min — ABNORMAL LOW (ref >60)
Glucose, Bld: 110 mg/dL — ABNORMAL HIGH (ref 70–99)
Potassium: 4.3 mmol/L (ref 3.5–5.1)
Sodium: 138 mmol/L (ref 135–145)

## 2019-02-18 MED ORDER — ALPRAZOLAM 0.25 MG PO TABS: 0.2500 mg | ORAL_TABLET | Freq: Every evening | ORAL | 0 refills | Status: AC

## 2019-02-18 NOTE — Discharge Summary (Signed)
Physician Discharge Summary  Nicole [name] KGM:[id number] DOB: 1934-03-04 DOA: 02/16/2019  PCP: [facility]  Admit date: 02/16/2019 Discharge date: 02/18/2019  Admitted From: LTC Disposition: SNF   Recommendations for Outpatient Follow-up:  1. Follow up with PCP in 1-2 weeks 2. Please obtain BMP/CBC in one week  Home Health: N/A Equipment/Devices: Per SNF Discharge Condition: Stable CODE STATUS: DNR Diet recommendation: Regular  Brief/Interim Summary: Nicole Mock Johnsonis an 85 y.o.femalewith medical history significant fordementia, depression/anxiety, hypothyroidism, CKD 3, chronic constipation who presented to [facility] after a fall. Patient has a history of dementia and is unable to provide a history. History is mainly obtained from ED physician andmedical records. Patient was in her usual state of health prior to this, and staff noticed patient was complaining of left hip pain. EMS called and patient was transferred to ED for further evaluation. X-ray of the left hip revealed a mildly displaced left femoral neck fracture with varus angulation. VS's showed elevated BP, tachycardia, no fever. WBC 11.2k, creatinine near baseline at 1.46. Orthopedics performed left hip hemiarthroplasty 4/4 and postoperative course has been significant for mild acute blood loss anemia not requiring transfusion. She is stable for discharge and will require SNF placement which has been arranged.   Discharge Diagnoses:  Principal Problem:   Left displaced femoral neck fracture (HCC) Active Problems:   S/p left hip fracture  Left hip fracture s/p fall now s/p anterior approach left hip hemiarthroplasty: - DVT ppx is lovenox per orthopedic surgery x14 days - Pain control per orthopedic surgery, prescriptions printed and signed. Continue bowel regimen. - WBAT. Continue therapy efforts at SNF  Acute blood loss postoperative anemia:  - Monitor in the next week. Hgb showing  signs of stabilizing  HTN: Well-controlled - Continue current management  Thrombocytopenia: Mild, stable. 132k at DC. - Monitor in the next week. Ok to continue DVT ppx.   Stage III CKD: Near baseline - Renally dose medications, avoid nephrotoxins as able  Leukocytosis: Reactive, afebrile.  History of UTI: Continue suppressive trimpex (PTA med)  Hypothyroidism:  - Continue synthroid  Dementia:  - Continue aricept, namenda, dispo to SNF planned  Chronic depression/anxiety:  - Continue home medications including low dose xanax  Discharge Instructions Discharge Instructions    Diet - low sodium heart healthy   Complete by:  As directed    Increase activity slowly   Complete by:  As directed    Weight bearing as tolerated   Complete by:  As directed      Allergies as of 02/18/2019      Reactions   Tramadol Nausea And Vomiting   Paxil [paroxetine Hcl] Other (See Comments)   Makes tremors worse      Medication List    STOP taking these medications   acetaminophen 500 MG tablet Commonly known as:  TYLENOL   HYDROcodone-acetaminophen 5-325 MG tablet Commonly known as:  NORCO/VICODIN Replaced by:  HYDROcodone-acetaminophen 7.5-325 MG tablet   naproxen sodium 220 MG tablet Commonly known as:  ALEVE     TAKE these medications   ALPRAZolam 0.25 MG tablet Commonly known as:  XANAX Take 1 tablet (0.25 mg total) by mouth every evening. One tab at 5 pm   busPIRone 15 MG tablet Commonly known as:  BUSPAR Take 15-22.5 mg by mouth 2 (two) times daily. Take 15 mg by mouth daily at 0800 and 22.5 mg at 1400   Cranberry 425 MG Caps Take 425 mg by mouth daily.  cycloSPORINE 0.05 % ophthalmic emulsion Commonly known as:  RESTASIS Place 1 drop into both eyes 2 (two) times daily.   docusate sodium 100 MG capsule Commonly known as:  COLACE Take 100 mg by mouth 2 (two) times daily.   donepezil 5 MG tablet Commonly known as:  ARICEPT Take 1 tablet (5 mg total) by  mouth at bedtime.   enoxaparin 40 MG/0.4ML injection Commonly known as:  LOVENOX Inject 0.4 mLs (40 mg total) into the skin daily.   fexofenadine 180 MG tablet Commonly known as:  ALLEGRA Take 90 mg by mouth daily.   Flonase 50 MCG/ACT nasal spray Generic drug:  fluticasone Place 2 sprays into both nostrils daily.   HYDROcodone-acetaminophen 7.5-325 MG tablet Commonly known as:  Norco Take 1-2 tablets by mouth every 6 (six) hours as needed for moderate pain. Replaces:  HYDROcodone-acetaminophen 5-325 MG tablet   levothyroxine 75 MCG tablet Commonly known as:  SYNTHROID, LEVOTHROID Take 75 mcg by mouth daily before breakfast.   Melatonin 5 MG Tabs Take 5 mg by mouth at bedtime.   memantine 10 MG tablet Commonly known as:  NAMENDA Take 10 mg by mouth 2 (two) times daily.   mirtazapine 15 MG tablet Commonly known as:  REMERON Take 2 tablets (30 mg total) by mouth at bedtime.   polyethylene glycol packet Commonly known as:  MIRALAX / GLYCOLAX Take 17 g by mouth daily. Mix in 8 ounces of water/juice and drink.   senna 8.6 MG Tabs tablet Commonly known as:  SENOKOT Take 1 tablet by mouth every 3 (three) days. Notes to patient:  Take as prescribed   triamcinolone 0.1 % cream : eucerin Crea Apply 1 application topically 2 (two) times daily as needed for rash or itching.   trimethoprim 100 MG tablet Commonly known as:  TRIMPEX Take 100 mg by mouth daily. One tab po qhs   Vitamin D 50 MCG (2000 UT) tablet Take 4,000 Units by mouth daily.            Discharge Care Instructions  (From admission, onward)         Start     Ordered   02/16/19 0000  Weight bearing as tolerated     02/16/19 1610         Follow-up Information    Tarry Kos, MD In 2 weeks.   Specialty:  Orthopedic Surgery Why:  For suture removal, For wound re-check Contact information: [address] [place] [address] [phone]        [facility]  Follow up.   Specialty:  Assisted Living Facility Contact information: [address] [place] [address] [phone]          Allergies  Allergen Reactions  . Tramadol Nausea And Vomiting  . Paxil [Paroxetine Hcl] Other (See Comments)    Makes tremors worse    Consultations:  Orthopedics, Dr. Roda Shutters  Procedures/Studies: Pelvis Portable  Result Date: 02/16/2019 CLINICAL DATA:  Left hip arthroplasty EXAM: PORTABLE PELVIS 1-2 VIEWS COMPARISON:  Fluoroscopy from earlier today FINDINGS: Left hip arthroplasty that is located in the frontal projection. No evidence of periprosthetic fracture. IMPRESSION: Unremarkable left hip arthroplasty. Electronically Signed   By: Marnee Spring M.D.   On: 02/16/2019 13:58   Dg Chest Port 1 View  Result Date: 02/16/2019 CLINICAL DATA:  Hip fracture. Pt arrives via [facility] from [facility]. Pt fell yesterday afternoon in the restroom. Facility noticed that she was complaining of pain this morning and  call for transport. Pt demented, but alert to normal per staff. EXAM: PORTABLE CHEST 1 VIEW COMPARISON:  01/07/2014 FINDINGS: Cardiac silhouette is normal in size. No mediastinal or hilar masses. No evidence of adenopathy. Clear lungs.  No pleural effusion or pneumothorax. Skeletal structures are grossly intact. IMPRESSION: No active disease. Electronically Signed   By: Amie Portlandavid  Ormond M.D.   On: 02/16/2019 05:50   Dg C-arm 1-60 Min-no Report  Result Date: 02/16/2019 Fluoroscopy was utilized by the requesting physician.  No radiographic interpretation.   Dg Hip Operative Unilat W Or W/o Pelvis Left  Result Date: 02/16/2019 CLINICAL DATA:  Post left total hip replacement. EXAM: OPERATIVE LEFT HIP (WITH PELVIS IF PERFORMED) 2 VIEWS TECHNIQUE: Fluoroscopic spot image(s) were submitted for interpretation post-operatively. FLUOROSCOPY TIME:  7 seconds (0.7 mGy) COMPARISON:  Left hip radiographs-earlier same day FINDINGS: Post bipolar left hip replacement. Alignment  appears anatomic given AP projection. No definite fracture or dislocation. There is a minimal amount of expected subcutaneous emphysema about the operative site. No radiopaque foreign body. IMPRESSION: Post left bipolar hip replacement without evidence of complication. Electronically Signed   By: Simonne ComeJohn  Watts M.D.   On: 02/16/2019 12:07   Dg Hip Unilat W Or Wo Pelvis 2-3 Views Left  Result Date: 02/16/2019 CLINICAL DATA:  Pt arrives via [facility] from [facility]. Pt fell yesterday afternoon in the restroom. Facility noticed that she was complaining of pain this morning and call for transport. Pt demented, but alert to normal per staff. EXAM: DG HIP (WITH OR WITHOUT PELVIS) 2-3V LEFT COMPARISON:  None. FINDINGS: Displaced fracture of the left femoral neck. Distal fracture component has displaced superiorly by approximately 1 cm. There is varus angulation. No other fractures. No bone lesion. Hip joints, SI joints and symphysis pubis are normally aligned. Skeletal structures are demineralized. IMPRESSION: Mildly displaced, varus angulated left femoral neck fracture. No dislocation. Electronically Signed   By: Amie Portlandavid  Ormond M.D.   On: 02/16/2019 05:50    Left hip hemiarthroplasty 4/4  Subjective: Pain is controlled. Pt confused but not agitated. Denies dyspnea, chest pain, abd pain. No events reported overnight.  Discharge Exam: Vitals:   02/17/19 2156 02/18/19 0622  BP: (!) 118/55 120/78  Pulse: 99 91  Resp: 18 18  Temp: 99 F (37.2 C) 98 F (36.7 C)  SpO2: 91% 97%   General: Pt is alert, awake, not oriented, not in acute distress Cardiovascular: No JVD Respiratory: Nonlabored, grossly clear Abdominal: Soft, NT, ND, bowel sounds + Extremities: No edema, no cyanosis. Left anterolateral hip with mepilex dressing c/d/i, thigh compartment soft, distally warm, brisk cap refill, strength and sensation intact.  Labs: Basic Metabolic Panel: Recent Labs  Lab 02/16/19 0516 02/17/19 0656  02/18/19 0322  NA 138 139 138  K 4.3 4.5 4.3  CL 104 106 107  CO2 26 26 24   GLUCOSE 137* 123* 110*  BUN 30* 22 22  CREATININE 1.46* 1.55* 1.37*  CALCIUM 9.3 8.6* 8.8*   CBC: Recent Labs  Lab 02/16/19 0516 02/17/19 0656 02/18/19 0322  WBC 11.2* 10.2 11.1*  NEUTROABS 9.5*  --   --   HGB 13.8 11.1* 10.6*  HCT 42.2 34.4* 33.9*  MCV 100.0 101.8* 103.0*  PLT 164 125* 132*   Time coordinating discharge: Approximately 40 minutes  Tyrone Nineyan B Maxi Rodas, MD  [facility] 02/18/2019, 10:00 AM

## 2019-02-18 NOTE — TOC Transition Note (Addendum)
Transition of Care [facility]) - CM/SW Discharge Note   Patient Details  Name: Nicole [name] MRN: [id number] Date of Birth: 04/28/1934  Transition of [facility] CM/SW Contact:  Clearance Coots, LCSW Phone Number: 02/18/2019, 11:05 AM   Clinical Narrative:    Patient daughter aware of discharge/ Spoke to Facility Rep. Kirstein for pending questions about care at facility.  [facility] called to transport.  Room:1204P Nurse given the number to call report: [phone]   Final next level of care: Skilled Nursing Facility Barriers to Discharge: No Barriers Identified   Patient Goals and CMS Choice Patient states their goals for this hospitalization and ongoing recovery are:: patient unable to participate in goal setting at this time CMS Medicare.gov Compare Post Acute Care list provided to:: Patient Represenative (must comment) Choice offered to / list presented to : Adult Children  Discharge Placement PASRR number recieved: 02/17/19(Pending Approval/ 30 Day Waiver )            Patient chooses bed at: [facility] Patient to be transferred to facility by: [facility]  Name of family member notified: Daughter Steward Drone  Patient and family notified of of transfer: 02/18/19  Discharge Plan and Services                          Social Determinants of Health (SDOH) Interventions     Readmission Risk Interventions No flowsheet data found.

## 2019-02-18 NOTE — Plan of Care (Signed)
Pt alert, disoriented at baseline.  Plan to d/c to [facility] today per MD order.

## 2019-02-18 NOTE — Progress Notes (Signed)
RN called [facility] and report given.  Pt transferred by EMS in stable condition.

## 2019-02-18 NOTE — Progress Notes (Signed)
Subjective: 2 Days Post-Op Procedure(s) (LRB): ANTERIOR APPROACH HEMI HIP ATRTHROPLASY (Left) Patient reports pain as mild.  Resting comfortably in bed.  Objective: Vital signs in last 24 hours: Temp:  [97.4 F (36.3 C)-99 F (37.2 C)] 98 F (36.7 C) (04/06 0622) Pulse Rate:  [91-102] 91 (04/06 0622) Resp:  [18] 18 (04/06 0622) BP: (118-127)/(55-78) 120/78 (04/06 0622) SpO2:  [91 %-97 %] 97 % (04/06 0622)  Intake/Output from previous day: 04/05 0701 - 04/06 0700 In: 916.8 [P.O.:720; I.V.:196.8] Out: 600 [Urine:600] Intake/Output this shift: No intake/output data recorded.  Recent Labs    02/16/19 0516 02/17/19 0656 02/18/19 0322  HGB 13.8 11.1* 10.6*   Recent Labs    02/17/19 0656 02/18/19 0322  WBC 10.2 11.1*  RBC 3.38* 3.29*  HCT 34.4* 33.9*  PLT 125* 132*   Recent Labs    02/17/19 0656 02/18/19 0322  NA 139 138  K 4.5 4.3  CL 106 107  CO2 26 24  BUN 22 22  CREATININE 1.55* 1.37*  GLUCOSE 123* 110*  CALCIUM 8.6* 8.8*   No results for input(s): LABPT, INR in the last 72 hours.  Neurovascular intact Sensation intact distally Intact pulses distally Dorsiflexion/Plantar flexion intact Incision: dressing C/D/I No cellulitis present Compartment soft   Assessment/Plan: 2 Days Post-Op Procedure(s) (LRB): ANTERIOR APPROACH HEMI HIP ATRTHROPLASY (Left) Up with therapy  WBAT LLE ABLA- mild and stable Will likely need SNF at d/c F/u with Dr. Roda Shutters 2 weeks post-op Continue plan per medicine team      Cristie Hem 02/18/2019, 7:39 AM

## 2019-02-26 ENCOUNTER — Telehealth (INDEPENDENT_AMBULATORY_CARE_PROVIDER_SITE_OTHER): Payer: Self-pay | Admitting: Orthopaedic Surgery

## 2019-02-26 NOTE — Telephone Encounter (Signed)
pts daughter, Clyde Canterbury called. patientt is scheduled for 1st postop appt this Friday (partial hip replacement). She is in a Museum/gallery curator. What to do about this appt? Please call [phone]

## 2019-02-26 NOTE — Telephone Encounter (Signed)
If she is doing ok, all I need to see is her incision.  Can someone send me a picture of the incision.  Also would need someone to take out sutures/staples.

## 2019-02-26 NOTE — Telephone Encounter (Signed)
See message.

## 2019-02-27 NOTE — Telephone Encounter (Signed)
Called patient's daughter Steward Drone, No answer. LMOM to return call to advise on message below.

## 2019-03-01 ENCOUNTER — Other Ambulatory Visit: Payer: Self-pay

## 2019-03-01 ENCOUNTER — Ambulatory Visit (INDEPENDENT_AMBULATORY_CARE_PROVIDER_SITE_OTHER): Payer: Medicare Other

## 2019-03-01 ENCOUNTER — Ambulatory Visit (INDEPENDENT_AMBULATORY_CARE_PROVIDER_SITE_OTHER): Payer: Medicare Other | Admitting: Orthopaedic Surgery

## 2019-03-01 DIAGNOSIS — S72002A Fracture of unspecified part of neck of left femur, initial encounter for closed fracture: Secondary | ICD-10-CM

## 2019-03-01 DIAGNOSIS — Z8781 Personal history of (healed) traumatic fracture: Secondary | ICD-10-CM

## 2019-03-01 NOTE — Progress Notes (Signed)
Post-Op Visit Note   Patient: Nicole [name]           Date of Birth: December 22, 1933           MRN: [id number] Visit Date: 03/01/2019 PCP: [facility]   Assessment & Plan:  Chief Complaint:  Chief Complaint  Patient presents with  . Left Hip - Routine Post Op   Visit Diagnoses:  1. S/p left hip fracture   2. Left displaced femoral neck fracture [facility])     Plan: Nicole [name] is an 85 year old female who is two-week status post left partial hip replacement for femoral neck fracture.  She does have advanced dementia and she is currently staying at [facility].  She is not really walked.  She is endorsing some discomfort in her left hip and stomach with weightbearing.  Her surgical incision is healed without any evidence of infection.  Her leg lengths are equal.  She does not have any severe pain with gentle hip range of motion.  X-rays demonstrate a stable partial hip replacement without any complications.  From our standpoint she looks well for 2 weeks.  Her dementia certainly will make rehab and recovery a little bit more difficult.  We will see her back in 4 weeks with a repeat pelvis and lateral hip.  She may discontinue Lovenox and begin aspirin 81 mg twice daily for another 4 weeks.  This was all discussed with her daughter and she is in agreement.  Follow-Up Instructions: Return in about 4 weeks (around 03/29/2019).   Orders:  Orders Placed This Encounter  Procedures  . XR HIP UNILAT W OR W/O PELVIS 2-3 VIEWS LEFT   No orders of the defined types were placed in this encounter.   Imaging: Xr Hip Unilat W Or W/o Pelvis 2-3 Views Left  Result Date: 03/01/2019 Stable left partial hip replacement without complication.   PMFS History: Patient Active Problem List   Diagnosis Date Noted  . Left displaced femoral neck fracture (HCC) 02/16/2019  . S/p left hip fracture 02/16/2019  . Alzheimer's disease (HCC) 12/09/2015  . Tremor, essential 11/04/2015  . Depression  12/23/2014  . Generalized anxiety disorder 12/23/2014  . Memory deficit 09/22/2014  . TREMOR, ESSENTIAL 04/28/2008  . ASTHMATIC BRONCHITIS, ACUTE 04/28/2008  . ALLERGIC RHINITIS 04/28/2008  . ESOPHAGEAL REFLUX 04/28/2008  . INSOMNIA 04/28/2008   Past Medical History:  Diagnosis Date  . Acute bronchitis   . Allergic rhinitis, cause unspecified   . Alzheimer's disease (HCC) 12/09/2015  . Esophageal reflux   . Essential and other specified forms of tremor   . Insomnia, unspecified   . Memory deficit 09/22/2014    Family History  Problem Relation Age of Onset  . Allergies Brother   . Allergies Sister   . Cancer Father   . Tremor Father   . Other Mother        fracture hip age 49  . Cancer Brother   . Tremor Brother   . Cancer Brother   . Heart disease Brother   . Heart disease Brother     Past Surgical History:  Procedure Laterality Date  . ANTERIOR APPROACH HEMI HIP ARTHROPLASTY Left 02/16/2019   Procedure: ANTERIOR APPROACH HEMI HIP ATRTHROPLASY;  Surgeon: Tarry Kos, MD;  Location: [facility];  Service: Orthopedics;  Laterality: Left;  . bladder tack    . cataract surgery Bilateral sept and october 2015  . colon polyps    . TOTAL ABDOMINAL HYSTERECTOMY W/ BILATERAL  SALPINGOOPHORECTOMY     Social History   Occupational History  . 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 Smoking status: Never Smoker  . Smokeless tobacco: Never Used  Substance and Sexual Activity  . Alcohol use: No  . Drug use: No  . Sexual activity: Not on file

## 2019-03-01 NOTE — Telephone Encounter (Signed)
Called patients daughter brenda and they will come for appt today

## 2019-03-29 ENCOUNTER — Ambulatory Visit: Payer: Self-pay | Admitting: Orthopaedic Surgery

## 2019-04-03 ENCOUNTER — Other Ambulatory Visit: Payer: Self-pay

## 2019-04-03 ENCOUNTER — Ambulatory Visit (INDEPENDENT_AMBULATORY_CARE_PROVIDER_SITE_OTHER): Payer: Medicare Other

## 2019-04-03 ENCOUNTER — Encounter: Payer: Self-pay | Admitting: Orthopaedic Surgery

## 2019-04-03 ENCOUNTER — Ambulatory Visit (INDEPENDENT_AMBULATORY_CARE_PROVIDER_SITE_OTHER): Payer: Medicare Other | Admitting: Orthopaedic Surgery

## 2019-04-03 DIAGNOSIS — Z8781 Personal history of (healed) traumatic fracture: Secondary | ICD-10-CM

## 2019-04-03 NOTE — Progress Notes (Signed)
Post-Op Visit Note   Patient: Nicole [name]           Date of Birth: 06/19/1934           MRN: [id number] Visit Date: 04/03/2019 PCP: [facility]   Assessment & Plan:  Chief Complaint:  Chief Complaint  Patient presents with  . Left Hip - Pain   Visit Diagnoses:  1. S/p left hip fracture     Plan: Patient is a pleasant 85 year old female who [facility] at [facility]in [place].  She is 46 days status post left anterior hemi-hip arthroplasty, date of surgery 02/16/2019.  She has been doing fairly well.  Prior to fracturing the left hip, she utilized a cane with ambulation.  She is now using a walker.  She notes that she has been working with physical therapy.  She has minimal pain.  No fevers or chills.  Examination of the left hip reveals a fully healed scar.  She has full hip flexion.  She is neurovascularly intact distally.  At this point, she will continue with physical therapy.  She will follow-up with [place] in 6 weeks time for repeat evaluation and x-rays.  Follow-Up Instructions: Return in about 6 weeks (around 05/15/2019).   Orders:  Orders Placed This Encounter  Procedures  . XR HIP UNILAT W OR W/O PELVIS 2-3 VIEWS LEFT   No orders of the defined types were placed in this encounter.   Imaging: Xr Hip Unilat W Or W/o Pelvis 2-3 Views Left  Result Date: 04/03/2019 X-rays demonstrate a well-seated prosthesis without complication   PMFS History: Patient Active Problem List   Diagnosis Date Noted  . Left displaced femoral neck fracture (HCC) 02/16/2019  . S/p left hip fracture 02/16/2019  . Alzheimer's disease (HCC) 12/09/2015  . Tremor, essential 11/04/2015  . Depression 12/23/2014  . Generalized anxiety disorder 12/23/2014  . Memory deficit 09/22/2014  . TREMOR, ESSENTIAL 04/28/2008  . ASTHMATIC BRONCHITIS, ACUTE 04/28/2008  . ALLERGIC RHINITIS 04/28/2008  . ESOPHAGEAL REFLUX 04/28/2008  . INSOMNIA 04/28/2008   Past Medical History:   Diagnosis Date  . Acute bronchitis   . Allergic rhinitis, cause unspecified   . Alzheimer's disease (HCC) 12/09/2015  . Esophageal reflux   . Essential and other specified forms of tremor   . Insomnia, unspecified   . Memory deficit 09/22/2014    Family History  Problem Relation Age of Onset  . Allergies Brother   . Allergies Sister   . Cancer Father   . Tremor Father   . Other Mother        fracture hip age 5  . Cancer Brother   . Tremor Brother   . Cancer Brother   . Heart disease Brother   . Heart disease Brother     Past Surgical History:  Procedure Laterality Date  . ANTERIOR APPROACH HEMI HIP ARTHROPLASTY Left 02/16/2019   Procedure: ANTERIOR APPROACH HEMI HIP ATRTHROPLASY;  Surgeon: Tarry Kos, MD;  Location: [facility];  Service: Orthopedics;  Laterality: Left;  . bladder tack    . cataract surgery Bilateral sept and october 2015  . colon polyps    . TOTAL ABDOMINAL HYSTERECTOMY W/ BILATERAL SALPINGOOPHORECTOMY     Social History   Occupational History  . 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 Smoking status: Never Smoker  . Smokeless tobacco: Never Used  Substance and Sexual Activity  . Alcohol use: No  . Drug use: No  . Sexual  activity: Not on file

## 2019-04-13 ENCOUNTER — Telehealth: Payer: Self-pay | Admitting: Internal Medicine

## 2019-04-13 NOTE — Telephone Encounter (Signed)
RN spoke with the [facility] Power of [place],  Nicole [name].  Pt is currently living in a memory care unit at [facility] in [place], [place].  Ms. Nicole [name] shared that her mother is transported to medical visits by her living facility.  RN asked Ms Nicole [name] to contact the nursing facility to let them know that this RN would be calling to discuss the possible exposure to Covid-19 at her recent orthopedic visit.    Message left for Nicole Fetter, RN at [facility] in [place], [place], currently living arrangement for the patient.  Requested a return call [facility] to discuss follow-up for office visit to [place], [place].  Left telephone number [phone].

## 2019-05-15 ENCOUNTER — Ambulatory Visit (INDEPENDENT_AMBULATORY_CARE_PROVIDER_SITE_OTHER): Payer: Medicare Other | Admitting: Orthopaedic Surgery

## 2019-05-15 ENCOUNTER — Encounter: Payer: Self-pay | Admitting: Orthopaedic Surgery

## 2019-05-15 ENCOUNTER — Other Ambulatory Visit: Payer: Self-pay

## 2019-05-15 ENCOUNTER — Ambulatory Visit (INDEPENDENT_AMBULATORY_CARE_PROVIDER_SITE_OTHER): Payer: Medicare Other

## 2019-05-15 DIAGNOSIS — Z8781 Personal history of (healed) traumatic fracture: Secondary | ICD-10-CM

## 2019-05-15 DIAGNOSIS — S72002A Fracture of unspecified part of neck of left femur, initial encounter for closed fracture: Secondary | ICD-10-CM

## 2019-05-15 NOTE — Progress Notes (Signed)
Post-Op Visit Note   Patient: Nicole [name]           Date of Birth: 1934-08-06           MRN: [id number] Visit Date: 05/15/2019 PCP: [place], [place]:  Chief Complaint:  Chief Complaint  Patient presents with  . Left Hip - Routine Post Op   Visit Diagnoses:  1. S/p left hip fracture   2. Left displaced femoral neck fracture ([place])     Plan: [name] is an 85 year old female who comes in for [name] 18-month postoperative visit status post left partial hip replacement for femoral neck fracture.  She is doing well overall.  She is ambulating with a walker.  She comes in today with [name] caretaker.  [name] surgical scar is fully healed.  She has no pain with hip range of motion or movement.  [name] x-rays demonstrate stable partial hip replacement.  At this point patient has recovered from the surgery well.  I encouraged [name] to continue with physical therapy for continued strengthening and rehab so that she can recover from this.  Questions encouraged and answered.  Follow-up as needed.  Follow-Up Instructions: Return if symptoms worsen or fail to improve.   Orders:  Orders Placed This Encounter  Procedures  . XR HIP UNILAT W OR W/O PELVIS 2-3 VIEWS LEFT   No orders of the defined types were placed in this encounter.   Imaging: Xr Hip Unilat W Or W/o Pelvis 2-3 Views Left  Result Date: 05/15/2019 Stable partial hip replacement without complication.   PMFS History: Patient Active Problem List   Diagnosis Date Noted  . Left displaced femoral neck fracture ([place]) 02/16/2019  . S/p left hip fracture 02/16/2019  . Alzheimer's disease ([place]) 12/09/2015  . Tremor, essential 11/04/2015  . Depression 12/23/2014  . Generalized anxiety disorder 12/23/2014  . Memory deficit 09/22/2014  . TREMOR, ESSENTIAL 04/28/2008  . ASTHMATIC BRONCHITIS, ACUTE 04/28/2008  . ALLERGIC RHINITIS 04/28/2008  . ESOPHAGEAL REFLUX 04/28/2008  . INSOMNIA 04/28/2008   Past Medical  History:  Diagnosis Date  . Acute bronchitis   . Allergic rhinitis, cause unspecified   . Alzheimer's disease ([place]) 12/09/2015  . Esophageal reflux   . Essential and other specified forms of tremor   . Insomnia, unspecified   . Memory deficit 09/22/2014    Family History  Problem Relation Age of Onset  . Allergies Brother   . Allergies Sister   . Cancer Father   . Tremor Father   . Other Mother        fracture hip age 54  . Cancer Brother   . Tremor Brother   . Cancer Brother   . Heart disease Brother   . Heart disease Brother     Past Surgical History:  Procedure Laterality Date  . ANTERIOR APPROACH HEMI HIP ARTHROPLASTY Left 02/16/2019   Procedure: ANTERIOR APPROACH HEMI HIP ATRTHROPLASY;  Surgeon: Leandrew Koyanagi, MD;  Location: [facility];  Service: Orthopedics;  Laterality: Left;  . bladder tack    . cataract surgery Bilateral sept and october 2015  . colon polyps    . TOTAL ABDOMINAL HYSTERECTOMY W/ BILATERAL SALPINGOOPHORECTOMY     Social History   Occupational History  . 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 Smoking status: Never Smoker  . Smokeless tobacco: Never Used  Substance and Sexual Activity  . Alcohol use: No  . Drug use: No  . Sexual activity:  Not on file

## 2019-07-30 ENCOUNTER — Ambulatory Visit: Payer: Medicare Other | Admitting: Neurology

## 2020-04-14 DEATH — deceased

## 2020-11-30 ENCOUNTER — Emergency Department
Admission: EM | Admit: 2020-11-30 | Discharge: 2020-11-30 | Disposition: A | Discharge status: Home / Self Care | Payer: Medicare Other | Attending: Emergency Medicine | Admitting: Emergency Medicine

## 2020-11-30 DIAGNOSIS — B029 Zoster without complications: Secondary | ICD-10-CM | POA: Insufficient documentation

## 2020-11-30 MED ORDER — PREDNISONE 20 MG PO TABS
40.0000 mg | ORAL_TABLET | Freq: Every day | ORAL | 0 refills | Status: AC
Start: 2020-11-30 — End: 2020-12-05

## 2020-11-30 MED ORDER — VALACYCLOVIR HCL 1 G PO TABS
1000.0000 mg | ORAL_TABLET | Freq: Three times a day (TID) | ORAL | 0 refills | Status: AC
Start: 2020-11-30 — End: 2020-12-07

## 2020-11-30 MED ORDER — OXYCODONE HCL 5 MG PO TABS
5.0000 mg | ORAL_TABLET | Freq: Four times a day (QID) | ORAL | 0 refills | Status: AC | PRN
Start: 2020-11-30 — End: 2020-12-07

## 2020-11-30 NOTE — Discharge Instructions (Signed)
Dear Mrs. Heiny:    Thank you for choosing one of [facility] emergency departments.  I hope your visit today was EXCELLENT.    Specific instructions for your visit today:      Shingles    You have been diagnosed with "shingles."    Shingles is also called "varicella zoster." This happens from the "chicken pox" virus. About 1 out of 5 of people who had chicken pox as children get shingles. The chicken pox virus can live quietly for many years in the nerves of the body. Sometimes, something will wake up the virus and cause shingles. This happens in people who are healthy. However, sometimes other illnesses, stress or infections can lead to a case of shingles. Older adults or others at high risk for getting shingles should consider getting the shingles vaccine. This vaccine helps to boost the immune system and prevent the chicken pox virus from reawakening. The vaccine, Zostavax is recommended by the Centers for Disease Control and Prevention (CDC) for people over the age of 90. Even if you have had shingles before, the vaccine is helpful in preventing another outbreak. Go to the [facility] website for more information. [email or web address]    Symptoms of shingles include a painful rash that normally follows a pattern. The rash follows the course of a single nerve. This means that the rash is seen only on parts of the skin that get signals from that nerve. The most common locations are along the chest and back. On the chest, the rash normally looks like a 2 inch (5 cm) band that wraps around the chest. It starts from the spine (backbone) and goes to the front of the chest. The most serious place you can get shingles is around and in the eye. The symptoms usually last 2 weeks. However, they may stay up to one month.    If the rash appears on the face, you may have a lot of pain. There is a special nerve that gives feeling to the face. If the rash involves this nerve, it can spread to the  cornea of the eye. The cornea covers the colored part of the eye. This is a VERY SERIOUS EMERGENCY. You can lose your eyesight if it is not treated. If the rash is on your face and you have any eye irritation or pain, tell your doctor right away.    Treatment includes pain medicine. You may get anti-viral medications. However, these are most helpful if given within the first 3 days of symptoms.    Shingles can have complications. These include post herpetic neuralgia. This is a severe, ongoing pain. People who are older than 85 years old get it most often.    You should follow up with your regular doctor in 1-2 weeks.    YOU SHOULD SEEK MEDICAL ATTENTION IMMEDIATELY, EITHER HERE OR AT THE NEAREST EMERGENCY DEPARTMENT, IF ANY OF THE FOLLOWING OCCURS:   Your rash gets infected. The signs of infection include: redness around the wound, pus draining from the wound, worse pain or swelling in the area near the wound, and fever (temperature higher than 100.44F / 38C).   You have a cough, shortness of breath or problems breathing.   You have headaches, stiff neck or fever or get confused.   Your symptoms or problems get worse.        IF YOU DO NOT CONTINUE TO IMPROVE OR YOUR CONDITION WORSENS, PLEASE CONTACT YOUR DOCTOR OR RETURN IMMEDIATELY TO THE EMERGENCY DEPARTMENT.  Sincerely,  Lelan Pons, MD  Attending Emergency Physician  [facility] Emergency Department    OBTAINING A PRIMARY CARE APPOINTMENT    Primary care physicians (PCPs, also known as primary care doctors) are either internists or family medicine doctors. Both types of PCPs focus on health promotion, disease prevention, patient education and counseling, and treatment of acute and chronic medical conditions.    Call for an appointment with a primary care doctor.  Ask to see who is taking new patients.     [facility]  telephone:  [phone]  [email or web address]    For a pediatrician, call the [facility] referral line below.  You  can also call to make an appointment at [facility] (except [employer] and [facility]):    [address]  [place], [place] [address]  [phone]    [facility]  Call [phone] (available 24 hours a day, 7 days a week) if you need any further referrals and we can help you find a primary care doctor or specialist.  Also, available online at:  [email or web address]    For more information regarding our services at [facility], please call the number above or visit the website [email or web address]    YOUR CONTACT INFORMATION  Before leaving please check with registration to make sure we have an up-to-date contact number.  You can call registration at [phone], Option 7 [facility] location) or [phone], Option 1 [facility] location) to update your information.  For questions about your hospital bill, please call [phone].  For questions about your Emergency Dept Physician bill please call [phone].      FREE HEALTH SERVICES  If you need help with health or social services, please call 2-1-1 for a free referral to resources in your area.  2-1-1 is a free service connecting people with information on health insurance, free clinics, pregnancy, mental health, dental care, food assistance, housing, and substance abuse counseling.  Also, available online at:  http://www.211[place].org    MEDICAL RECORDS AND TESTS  Certain laboratory test results do not come back the same day, for example urine cultures.   We will contact you if other important findings are noted.  Radiology films are often reviewed again to ensure accuracy.  If there is any discrepancy, we will notify you.      Please call [phone] [facility] location) or [phone] ([place] location) to pick up a complimentary CD of any radiology studies performed.  If you or your doctor would like to request a copy of your medical records, please call  [phone].      ORTHOPEDIC INJURY   Please know that significant injuries can exist even when an initial x-ray is read as normal or negative.  This can occur because some fractures (broken bones) are not initially visible on x-rays.  For this reason, close outpatient follow-up with your primary care doctor or bone specialist (orthopedist) is required.    MEDICATIONS AND FOLLOWUP  Please be aware that some prescription medications can cause drowsiness.  Use caution when driving or operating machinery.    The examination and treatment you have received in our Emergency Department is provided on an emergency basis, and is not intended to be a substitute for your primary care physician.  It is important that your doctor checks you again and that you report any new or remaining problems at that time.      24 HOUR PHARMACIES  Two nearby 24 hour pharmacies are:  [facility]  [address]  [place], [place] [address]  [phone]    [facility]  [address]  [place], [place] [address]  [phone]      ASSISTANCE WITH INSURANCE    Affordable Care Act  [facility])  Call to start or finish an application, compare plans, enroll or ask a question.  [phone]  TTY: [phone]  Web:  Healthcare.gov    Help Enrolling in [facility]  [employer] [place]  [phone] (TOLL-FREE)  [phone] (TTY)  Web:  Http://www.coverva.org    Local Help Enrolling in the [facility]  [place] [facility]  [phone] (MAIN)  Email:  health-help@nvfs .org  Web:  BlackjackMyths.is  Address:  [address] [place], [place] [address]    SEDATING MEDICATIONS  Sedating medications include strong pain medications (e.g. narcotics), muscle relaxers, benzodiazepines (used for anxiety and as muscle relaxers), Benadryl/diphenhydramine and other antihistamines for allergic reactions/itching, and other medications.  If you are unsure if you have received a sedating medication, please ask your physician or nurse.  If you  received a sedating medication: DO NOT drive a car. DO NOT operate machinery. DO NOT perform jobs where you need to be alert.  DO NOT drink alcoholic beverages while taking this medicine.     If you get dizzy, sit or lie down at the first signs. Be careful going up and down stairs.  Be extra careful to prevent falls.     Never give this medicine to others.     Keep this medicine out of reach of children.     Do not take or save old medicines. Throw them away when outdated.     Keep all medicines in a cool, dry place. DO NOT keep them in your bathroom medicine cabinet or in a cabinet above the stove.    MEDICATION REFILLS  Please be aware that we cannot refill any prescriptions through the ER. If you need further treatment from what is provided at your ER visit, please follow up with your primary care doctor or your pain management specialist.

## 2020-11-30 NOTE — ED Provider Notes (Signed)
[name]      Visit date: 11/30/2020      CLINICAL SUMMARY          Diagnosis:    .     Final diagnoses:   Herpes zoster without complication         MDM Notes:      Well-appearing woman with signs and symptoms consistent with herpes zoster.  No significant systemic symptoms.  1 dermatome involvement.  No signs of superinfection.  Will treat with antivirals, pain control, steroids and have follow-up with primary doctor.  Patient agreeable.         Disposition:         Discharge               Discharge Prescriptions     Medication Sig Dispense Auth. Provider    valACYclovir HCL (VALTREX) 1000 MG tablet Take 1 tablet (1,000 mg total) by mouth 3 (three) times daily for 7 days 21 tablet Lelan Pons, MD    predniSONE (DELTASONE) 20 MG tablet Take 2 tablets (40 mg total) by mouth daily for 5 days 10 tablet Lelan Pons, MD    oxyCODONE (ROXICODONE) 5 MG immediate release tablet Take 1 tablet (5 mg total) by mouth every 6 (six) hours as needed for Pain 10 tablet Lelan Pons, MD                         CLINICAL INFORMATION        HPI:      Chief Complaint: Rash  .    Maureen Vasquez is a 87 y.o. female who presents with painful red rash.  Symptoms have been ongoing for about 2 days with preceding 3 days of abnormal sensation over her skin.  No history of the same.  She has ongoing moderate burning pain in her anterior and posterior chest wall on the right.  No fever, vomiting, diarrhea.  She took Tylenol this morning with some relief.  She did not have the shingles vaccine.    History obtained from: Patient          ROS:      Positive and negative ROS elements as per HPI.  All other systems reviewed and negative.      Physical Exam:      Pulse (!) 106   BP 182/77   Resp 18   SpO2 95 %   Temp 97 F (36.1 C)    BP 182/77    Pulse (!) 106    Temp 97 F (36.1 C) (Oral)    Resp 18    Ht 5\' 3"  (1.6 m)    Wt 56.7 kg    SpO2 95%    BMI 22.14 kg/m   General appearance:  Patient sitting in bed comfortably in no apparent distress. Well nourished. Non-toxic appearing.  HEENT: Head is normocephalic and atraumatic. Sclerae anicteric.  Mask in place.  Neck: Supple  Pulmonary: Normal work of breathing.   Cardiovascular: Regular rate.   Abdominal: Non-distended.  Extremities: No edema appreciated. Warm and well perfused.  Musculoskeletal: No gross deformities appreciated.   Neurologic: Awake and alert.   Psychiatric: Cooperative  Skin: R chest with wrapping vesicular rash from sternum to spine. In mid chest              PAST HISTORY        Primary Care Provider: Mack Hook, MD  PMH/PSH:    .     Past Medical History:   Diagnosis Date    Arthritis     Rt hand index finger    Back pain     Ear, nose and throat disorder     sinus issues    Encounter for blood transfusion 2015    Gastroesophageal reflux disease     H/O acute pancreatitis 1990's    Low back pain 2014    3 slipped discs    Lymphona, mantle cell, inguinal region/lower limb     Malignant neoplasm 2014    Non Hodgkins lymphoma/s/p chemo    NHL (non-Hodgkin's lymphoma) October 2013    w/ Splenic Nodules    Post-operative nausea and vomiting     Cardiac arrest 34 yrs ago with ruptured appendix/also in 2000 w/endoscopy    Seasonal allergic rhinitis        She has a past surgical history that includes APPENDECTOMY (OPEN); EXCISION, SQUAMOUS CELL; PLACEMENT, MEDIPORT; and EXTRACTION, CATARACT, PHACO, IOL (Left, 04/07/2016).      Social/Family History:      She reports that she has never smoked. She has never used smokeless tobacco. She reports that she does not drink alcohol and does not use drugs.    Family History   Problem Relation Age of Onset    Heart attack Mother     Heart disease Mother     Parkinsonism Mother     Hyperlipidemia Mother     Heart disease Sister     Heart disease Brother          Listed Medications on Arrival:    .     Home Medications     Med List Status: Complete Set By: Lynwood Dawley,  RN at 11/30/2020  3:15 PM                benzonatate (TESSALON) 100 MG capsule     Take 1 capsule (100 mg total) by mouth 3 (three) times daily as needed for Cough.     docusate sodium (COLACE) 100 MG capsule     Take 100 mg by mouth 2 (two) times daily.     lactobacillus/streoptococcus (RISAQUAD) Cap     Take 1 capsule by mouth daily.     lidocaine (LIDODERM) 5 %     Place 1 patch onto the skin daily as needed. Remove & Discard patch within 12 hours or as directed by MD     MAGNESIUM CARBONATE PO     Take by mouth.     mirtazapine (REMERON) 15 MG tablet     Take 1 tablet (15 mg total) by mouth nightly.     ondansetron (ZOFRAN-ODT) 4 MG disintegrating tablet     Take 1 tablet (4 mg total) by mouth every 8 (eight) hours as needed for Nausea.     vitamin B-12 (CYANOCOBALAMIN) 1000 MCG tablet     Take 1,000 mcg by mouth daily.     vitamin D, cholecalciferol, 400 units tablet     Take 400 Units by mouth daily.         Allergies: She is allergic to aspirin, codeine, and salicylates.            VISIT INFORMATION        Clinical Course in the ED:                 Medications Given in the ED:    .     ED Medication  Orders (From admission, onward)    None            Procedures:      Procedures      Interpretations:      O2 sat-           saturation: 95 %; Oxygen use: room air; Interpretation: Normal                   RESULTS        Recent Lab Results:      Results     ** No results found for the last 24 hours. **              Radiology Results:      No orders to display               Scribe Attestation:      No scribe involved in the care of this patient                            Azalie, Harbeck, MD  12/01/20 1432

## 2021-02-25 IMAGING — CR PORTABLE CHEST - 1 VIEW
1 series · 1 of 1 positions shown · non-contrast
Comparison: 01/07/2014

CLINICAL DATA: Hip fracture.

Pt arrives via [REDACTED] [facility]. Pt fell yesterday afternoon
in the restroom. Facility noticed that she was complaining of pain
this morning and call for transport. Pt demented, but alert to
normal per staff.
EXAM:
PORTABLE CHEST 1 VIEW

[x chest ap]
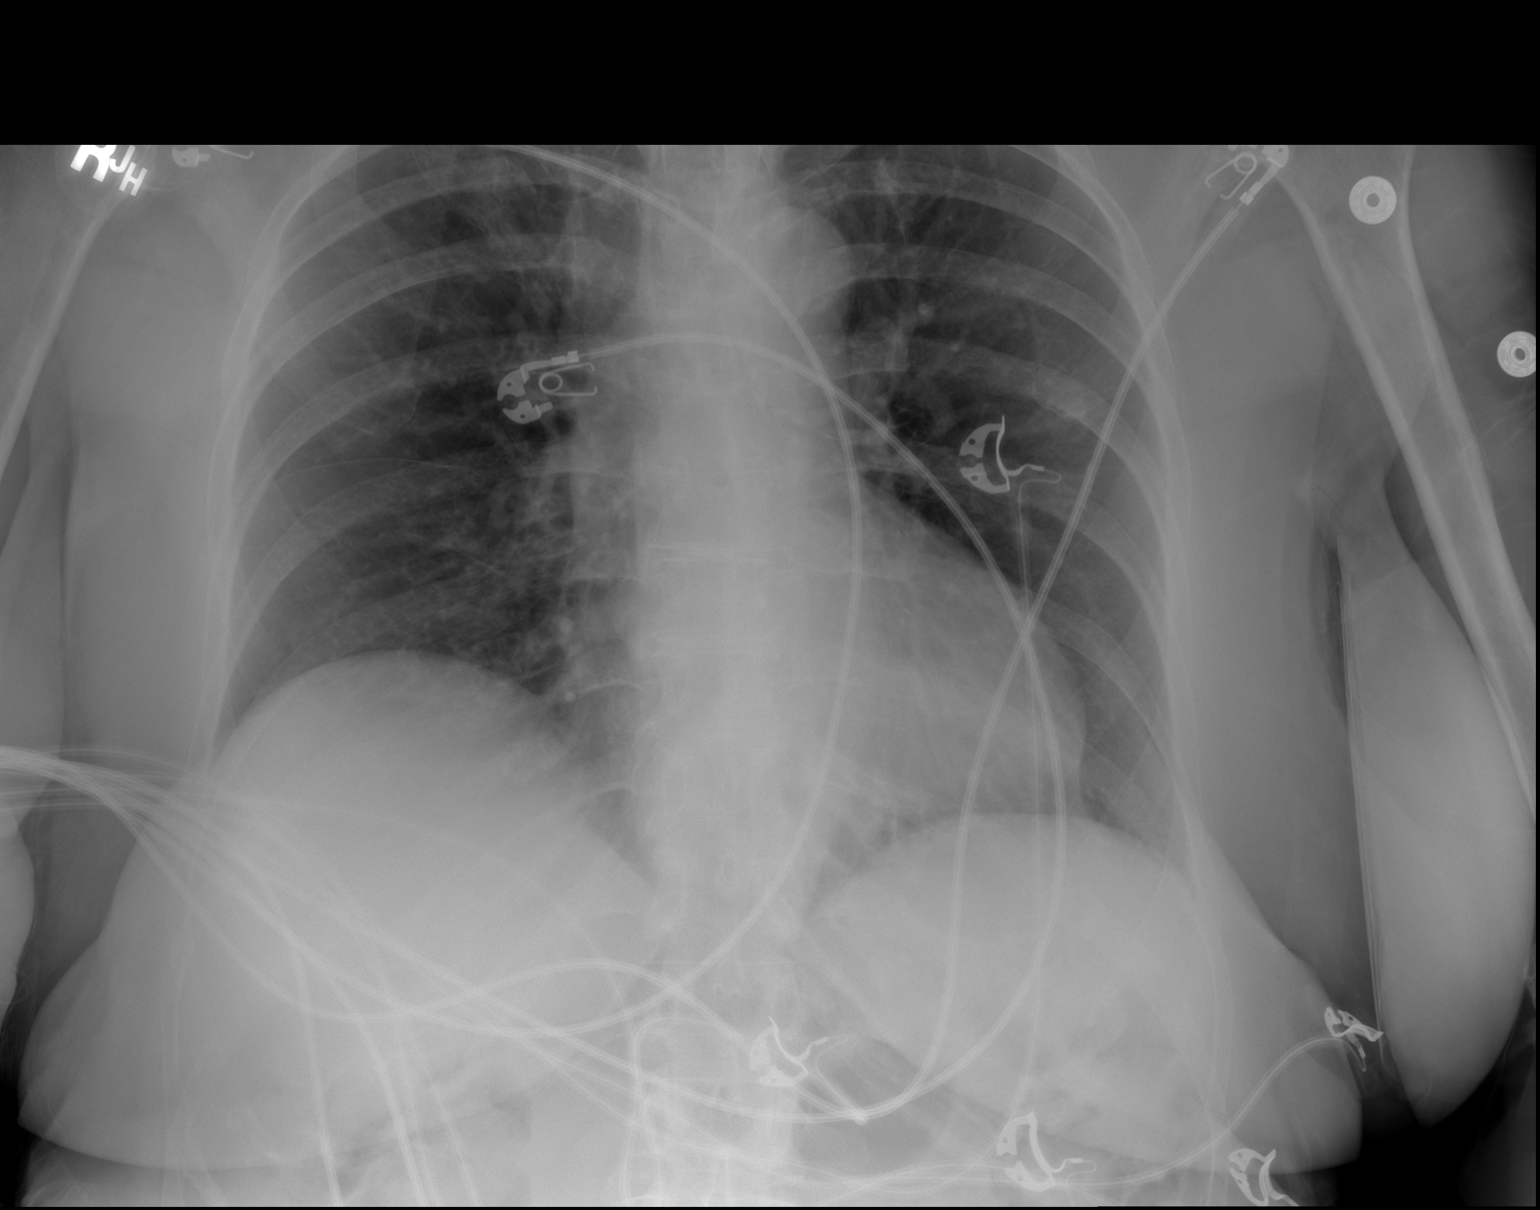

[1 of 1 positions shown; findings below may reference images not displayed]

FINDINGS: Cardiac silhouette is normal in size. No mediastinal or hilar
masses. No evidence of adenopathy.

Clear lungs.  No pleural effusion or pneumothorax.

Skeletal structures are grossly intact.
IMPRESSION: No active disease.

## 2021-02-26 ENCOUNTER — Ambulatory Visit (INDEPENDENT_AMBULATORY_CARE_PROVIDER_SITE_OTHER): Payer: Medicare Other | Admitting: Family Nurse Practitioner

## 2021-02-26 ENCOUNTER — Emergency Department: Payer: Medicare Other

## 2021-02-26 ENCOUNTER — Encounter (INDEPENDENT_AMBULATORY_CARE_PROVIDER_SITE_OTHER): Payer: Self-pay | Admitting: Family Nurse Practitioner

## 2021-02-26 ENCOUNTER — Emergency Department
Admission: EM | Admit: 2021-02-26 | Discharge: 2021-02-26 | Disposition: A | Discharge status: Home / Self Care | Payer: Medicare Other | Attending: Emergency Medicine | Admitting: Emergency Medicine

## 2021-02-26 VITALS — BP 156/78 | HR 87 | Temp 97.4°F | Resp 16 | Wt 115.0 lb

## 2021-02-26 DIAGNOSIS — R079 Chest pain, unspecified: Secondary | ICD-10-CM

## 2021-02-26 DIAGNOSIS — B0229 Other postherpetic nervous system involvement: Secondary | ICD-10-CM | POA: Insufficient documentation

## 2021-02-26 LAB — COMPREHENSIVE METABOLIC PANEL
ALT: 6 U/L (ref 0–55)
AST (SGOT): 18 U/L (ref 5–34)
Albumin/Globulin Ratio: 1.7 (ref 0.9–2.2)
Albumin: 4.1 g/dL (ref 3.5–5.0)
Alkaline Phosphatase: 79 U/L (ref 37–106)
Anion Gap: 10 (ref 5.0–15.0)
BUN: 12 mg/dL (ref 7–19)
Bilirubin, Total: 0.4 mg/dL (ref 0.2–1.2)
CO2: 26 mEq/L (ref 22–29)
Calcium: 8.9 mg/dL (ref 7.9–10.2)
Chloride: 99 mEq/L — ABNORMAL LOW (ref 100–111)
Creatinine: 0.6 mg/dL (ref 0.6–1.0)
Globulin: 2.4 g/dL (ref 2.0–3.6)
Glucose: 99 mg/dL (ref 70–100)
Potassium: 4.2 mEq/L (ref 3.5–5.1)
Protein, Total: 6.5 g/dL (ref 6.0–8.3)
Sodium: 135 mEq/L — ABNORMAL LOW (ref 136–145)

## 2021-02-26 LAB — CBC AND DIFFERENTIAL
Absolute NRBC: 0 10*3/uL (ref 0.00–0.00)
Basophils Absolute Automated: 0.03 10*3/uL (ref 0.00–0.08)
Basophils Automated: 0.4 %
Eosinophils Absolute Automated: 0.19 10*3/uL (ref 0.00–0.44)
Eosinophils Automated: 2.5 %
Hematocrit: 38.4 % (ref 34.7–43.7)
Hgb: 12.8 g/dL (ref 11.4–14.8)
Immature Granulocytes Absolute: 0.03 10*3/uL (ref 0.00–0.07)
Immature Granulocytes: 0.4 %
Lymphocytes Absolute Automated: 1.9 10*3/uL (ref 0.42–3.22)
Lymphocytes Automated: 25.4 %
MCH: 30.6 pg (ref 25.1–33.5)
MCHC: 33.3 g/dL (ref 31.5–35.8)
MCV: 91.9 fL (ref 78.0–96.0)
MPV: 9.7 fL (ref 8.9–12.5)
Monocytes Absolute Automated: 0.92 10*3/uL — ABNORMAL HIGH (ref 0.21–0.85)
Monocytes: 12.3 %
Neutrophils Absolute: 4.42 10*3/uL (ref 1.10–6.33)
Neutrophils: 59 %
Nucleated RBC: 0 /100 WBC (ref 0.0–0.0)
Platelets: 300 10*3/uL (ref 142–346)
RBC: 4.18 10*6/uL (ref 3.90–5.10)
RDW: 14 % (ref 11–15)
WBC: 7.49 10*3/uL (ref 3.10–9.50)

## 2021-02-26 LAB — GFR: EGFR: >60

## 2021-02-26 LAB — TROPONIN I: Troponin I: 0.01 ng/mL (ref 0.00–0.05)

## 2021-02-26 LAB — LIPASE: Lipase: 23 U/L (ref 8–78)

## 2021-02-26 MED ORDER — LIDOCAINE 5 % EX PTCH
1.0000 | MEDICATED_PATCH | CUTANEOUS | 0 refills | Status: DC
Start: 2021-02-26 — End: 2021-03-05

## 2021-02-26 MED ORDER — LIDOCAINE 5 % EX PTCH
1.0000 | MEDICATED_PATCH | Freq: Once | CUTANEOUS | Status: DC
Start: 2021-02-26 — End: 2021-02-26
  Administered 2021-02-26: 1 via TRANSDERMAL
  Filled 2021-02-26: qty 1

## 2021-02-26 MED ORDER — ACETAMINOPHEN 500 MG PO TABS
500.0000 mg | ORAL_TABLET | Freq: Once | ORAL | Status: AC
Start: 2021-02-26 — End: 2021-02-26
  Administered 2021-02-26: 500 mg via ORAL
  Filled 2021-02-26: qty 1

## 2021-02-26 NOTE — ED Provider Notes (Signed)
EMERGENCY DEPARTMENT NOTE     Patient initially seen and examined at   ED PHYSICIAN ASSIGNED     Date/Time Event User Comments    02/26/21 1726 Physician Assigned Maureen Leiter, MD assigned as Attending    02/26/21 1747 Physician Assigned Maureen Vasquez, Maureen Chess, MD assigned as Attending         ED MIDLEVEL (APP) ASSIGNED     None            HISTORY OF PRESENT ILLNESS     Chief Complaint: Chest Pain (From [facility]. Has sob. )        87 y.o. female      1. History Provided by: pt  2. Location of symptoms: cp  3. Duration: months  4. Timing: gradual  5. Quality: uncomfortbale  6. Severity: moderate  7. Associated signs and Symptoms: see ROS    MEDICAL HISTORY     Past Medical History:  Past Medical History:   Diagnosis Date    Arthritis     Rt hand index finger    Back pain     Ear, nose and throat disorder     sinus issues    Encounter for blood transfusion 2015    Gastroesophageal reflux disease     H/O acute pancreatitis 1990's    Low back pain 2014    3 slipped discs    Lymphona, mantle cell, inguinal region/lower limb     Malignant neoplasm 2014    Non Hodgkins lymphoma/s/p chemo    NHL (non-Hodgkin's lymphoma) October 2013    w/ Splenic Nodules    Post-operative nausea and vomiting     Cardiac arrest 34 yrs ago with ruptured appendix/also in 2000 w/endoscopy    Seasonal allergic rhinitis        Past Surgical History:  Past Surgical History:   Procedure Laterality Date    APPENDECTOMY (OPEN)      Age of 40    EXCISION, SQUAMOUS CELL      Right hand/ 2000    EXTRACTION, CATARACT, PHACO, IOL Left 04/07/2016    Procedure: EXTRACTION, CATARACT, PHACO, IOL;  Surgeon: Maureen Plunk, MD;  Location: [facility] OR;  Service: Ophthalmology;  Laterality: Left;  LEFT PHACO W/IOL    PLACEMENT, MEDIPORT         Social History:  Social History     Socioeconomic History    Marital status: Married   Tobacco Use    Smoking status: Never Smoker    Smokeless tobacco: Never Used    Haematologist Use: Never used   Substance and Sexual Activity    Alcohol use: No    Drug use: No       Family History:  Family History   Problem Relation Age of Onset    Heart attack Mother     Heart disease Mother     Parkinsonism Mother     Hyperlipidemia Mother     Heart disease Sister     Heart disease Brother        Outpatient Medication:  Discharge Medication List as of 02/26/2021  9:16 PM      CONTINUE these medications which have NOT CHANGED    Details   benzonatate (TESSALON) 100 MG capsule Take 1 capsule (100 mg total) by mouth 3 (three) times daily as needed for Cough., Starting 03/28/2014, Until Discontinued, Print      docusate sodium (COLACE) 100 MG capsule Take 100  mg by mouth 2 (two) times daily., Until Discontinued, Historical Med      lactobacillus/streoptococcus (RISAQUAD) Cap Take 1 capsule by mouth daily., Starting 03/28/2014, Until Discontinued, Print      !! lidocaine (LIDODERM) 5 % Place 1 patch onto the skin daily as needed. Remove & Discard patch within 12 hours or as directed by MD, Starting 04/30/2016, Until Discontinued, Print      MAGNESIUM CARBONATE PO Take by mouth., Until Discontinued, Historical Med      mirtazapine (REMERON) 15 MG tablet Take 1 tablet (15 mg total) by mouth nightly., Starting 03/28/2014, Until Discontinued, Print      ondansetron (ZOFRAN-ODT) 4 MG disintegrating tablet Take 1 tablet (4 mg total) by mouth every 8 (eight) hours as needed for Nausea., Starting 04/30/2016, Until Discontinued, Print      vitamin B-12 (CYANOCOBALAMIN) 1000 MCG tablet Take 1,000 mcg by mouth daily., Until Discontinued, Historical Med      vitamin D, cholecalciferol, 400 units tablet Take 400 Units by mouth daily., Until Discontinued, Historical Med       !! - Potential duplicate medications found. Please discuss with provider.            REVIEW OF SYSTEMS   Review of Systems    Review of Systems   Constitutional: Negative for fever.   HENT: Negative for congestion.    Eyes:  Negative for pain.   Respiratory: Negative for shortness of breath.    Cardiovascular: Positive for chest pain.   Gastrointestinal: Negative for abdominal pain, diarrhea, nausea and vomiting.   Musculoskeletal: Negative for back pain.   Skin: Positive for rash.   Neurological: Negative for syncope.   Psychiatric/Behavioral: Negative for agitation.           PHYSICAL EXAM     ED Triage Vitals   Enc Vitals Group      BP 02/26/21 1724 150/83      Heart Rate 02/26/21 1724 84      Resp Rate 02/26/21 1724 20      Temp 02/26/21 1728 98 F (36.7 C)      Temp Source 02/26/21 1728 Oral      SpO2 02/26/21 1724 96 %      Weight --       Height --       Head Circumference --       Peak Flow --       Pain Score 02/26/21 1724 4      Pain Loc --       Pain Edu? --       Excl. in GC? --        Physical Exam  Vitals and nursing note reviewed.   Constitutional:       Appearance: She is well-developed.   HENT:      Head: Normocephalic.   Eyes:      Conjunctiva/sclera: Conjunctivae normal.   Cardiovascular:      Rate and Rhythm: Normal rate and regular rhythm.   Pulmonary:      Effort: Pulmonary effort is normal. No respiratory distress.      Breath sounds: Normal breath sounds. No wheezing.   Chest:      Comments: Scaring from post herpetic rash to R lower chest  Abdominal:      Palpations: Abdomen is soft.      Tenderness: There is no abdominal tenderness.   Musculoskeletal:         General: No deformity.  Cervical back: Neck supple.   Skin:     General: Skin is warm.   Neurological:      Mental Status: She is alert.             MEDICAL DECISION MAKING       PULSE OXIMETRY    Oxygen Saturation by Pulse Oximetry: 98%  Interpretation:  Normal     CARDIAC STUDIES    The following cardiac studies were independently interpreted by the Emergency Medicine Physician.  For full cardiac study results please see chart.    EKG:  Interpreted by the EP.   Time Interpreted: 625PM   Rate: 77   Interpretation: NSR, NAD, no ST abnromality, no twi,  normal intervals, normal EKG   Comparison: unchanged from 2015      RADIOLOGY IMAGING STUDIES      US Abdomen Limited RUQ   Final Result       No findings to account for reported symptoms, nor other acute   abnormality detected.      Mosetta Putt, MD    02/26/2021 9:08 PM      Ribs Right/PA Chest   Final Result    No acute abnormality.      Bosie Helper, MD    02/26/2021 6:58 PM      CT Head WO Contrast   Final Result    No acute intracranial abnormality. Diffuse cerebral volume   loss and chronic small vessel ischemic changes.       Georgann Housekeeper, MD    02/26/2021 6:46 PM            EMERGENCY DEPT. MEDICATIONS      ED Medication Orders (From admission, onward)    Start Ordered     Status Ordering Provider    02/26/21 1807 02/26/21 1806  acetaminophen (TYLENOL) tablet 500 mg  Once        Route: Oral  Ordered Dose: 500 mg     Last MAR action: Given Uri Turnbough    02/26/21 1807 02/26/21 1806  lidocaine (LIDODERM) 5 % 1 patch  Once        Route: Transdermal  Ordered Dose: 1 patch     Last MAR action: Patch Applied Camron Essman            LABORATORY RESULTS    Ordered and independently interpreted AVAILABLE laboratory tests. Please see results section in chart for full details.  Results for orders placed or performed during the hospital encounter of 02/26/21   CBC and differential   Result Value Ref Range    WBC 7.49 3.10 - 9.50 x10 3/uL    Hgb 12.8 11.4 - 14.8 g/dL    Hematocrit 16.1 09.6 - 43.7 %    Platelets 300 142 - 346 x10 3/uL    RBC 4.18 3.90 - 5.10 x10 6/uL    MCV 91.9 78.0 - 96.0 fL    MCH 30.6 25.1 - 33.5 pg    MCHC 33.3 31.5 - 35.8 g/dL    RDW 14 11 - 15 %    MPV 9.7 8.9 - 12.5 fL    Neutrophils 59.0 None %    Lymphocytes Automated 25.4 None %    Monocytes 12.3 None %    Eosinophils Automated 2.5 None %    Basophils Automated 0.4 None %    Immature Granulocytes 0.4 None %    Nucleated RBC 0.0 0.0 - 0.0 /100 WBC    Neutrophils Absolute 4.42 1.10 - 6.33 x10  3/uL    Lymphocytes Absolute Automated 1.90 0.42 - 3.22  x10 3/uL    Monocytes Absolute Automated 0.92 (H) 0.21 - 0.85 x10 3/uL    Eosinophils Absolute Automated 0.19 0.00 - 0.44 x10 3/uL    Basophils Absolute Automated 0.03 0.00 - 0.08 x10 3/uL    Immature Granulocytes Absolute 0.03 0.00 - 0.07 x10 3/uL    Absolute NRBC 0.00 0.00 - 0.00 x10 3/uL   Comprehensive metabolic panel   Result Value Ref Range    Glucose 99 70 - 100 mg/dL    BUN 12 7 - 19 mg/dL    Creatinine 0.6 0.6 - 1.0 mg/dL    Sodium 478 (L) 295 - 145 mEq/L    Potassium 4.2 3.5 - 5.1 mEq/L    Chloride 99 (L) 100 - 111 mEq/L    CO2 26 22 - 29 mEq/L    Calcium 8.9 7.9 - 10.2 mg/dL    Protein, Total 6.5 6.0 - 8.3 g/dL    Albumin 4.1 3.5 - 5.0 g/dL    AST (SGOT) 18 5 - 34 U/L    ALT 6 0 - 55 U/L    Alkaline Phosphatase 79 37 - 106 U/L    Bilirubin, Total 0.4 0.2 - 1.2 mg/dL    Globulin 2.4 2.0 - 3.6 g/dL    Albumin/Globulin Ratio 1.7 0.9 - 2.2    Anion Gap 10.0 5.0 - 15.0   Troponin I   Result Value Ref Range    Troponin I 0.01 0.00 - 0.05 ng/mL   GFR   Result Value Ref Range    EGFR >60.0    Lipase   Result Value Ref Range    Lipase 23 8 - 78 U/L       CRITICAL CARE/PROCEDURES      CRITICAL CARE:     DISCUSSION            Vital Signs: Reviewed the patients vital signs.   Nursing Notes: Reviewed and utilized available nursing notes.  Medical Records Reviewed:       ED Course:    ED Course as of 02/26/21 2203   Fri Feb 26, 2021   2035 Pt feels improved, plan for lidocaine patch prn to f/u with pmd.  [SA]      ED Course User Index  [SA] Suzy Bouchard, MD       At discharge patient feels better. I have discussed all testing results and plan of care with patient. All questions solicited and addressed. Possibility of evolving illness reviewed. Patient agrees with going home, following up and returning if worsening symptoms.      Provider Note: 87 year old female with past medical history of non-Hodgkin's lymphoma in remission, hyperlipidemia, shingles to right lower chest presents with months of right lower chest  discomfort, had a fall a month prior with head injury, denies any fever vomiting diarrhea, has allergy related cough, some constipation, history of appendectomy, vital signs stable, EKG NSR, postherpetic rash to right lower chest which is the area of discomfort.  Differentials postherpetic neuralgia, low suspicion for ACS, gallstones, rib fracture.  Plan is for lab work and lidocaine/Tylenol and imaging for disposition.     DIAGNOSIS      Diagnosis:  Final diagnoses:   Post herpetic neuralgia       Disposition:  ED Disposition     ED Disposition   Discharge    Condition   --    Date/Time   Fri Feb 26, 2021  9:15 PM    Comment   Maureen Vasquez discharge to home/self care.    Condition at disposition: Stable               Prescriptions:  Discharge Medication List as of 02/26/2021  9:16 PM      START taking these medications    Details   !! lidocaine (LIDODERM) 5 % Place 1 patch onto the skin every 24 hours Remove & Discard patch within 12 hours or as directed by MD, Starting Fri 02/26/2021, E-Rx       !! - Potential duplicate medications found. Please discuss with provider.      CONTINUE these medications which have NOT CHANGED    Details   benzonatate (TESSALON) 100 MG capsule Take 1 capsule (100 mg total) by mouth 3 (three) times daily as needed for Cough., Starting 03/28/2014, Until Discontinued, Print      docusate sodium (COLACE) 100 MG capsule Take 100 mg by mouth 2 (two) times daily., Until Discontinued, Historical Med      lactobacillus/streoptococcus (RISAQUAD) Cap Take 1 capsule by mouth daily., Starting 03/28/2014, Until Discontinued, Print      !! lidocaine (LIDODERM) 5 % Place 1 patch onto the skin daily as needed. Remove & Discard patch within 12 hours or as directed by MD, Starting 04/30/2016, Until Discontinued, Print      MAGNESIUM CARBONATE PO Take by mouth., Until Discontinued, Historical Med      mirtazapine (REMERON) 15 MG tablet Take 1 tablet (15 mg total) by mouth nightly., Starting 03/28/2014, Until  Discontinued, Print      ondansetron (ZOFRAN-ODT) 4 MG disintegrating tablet Take 1 tablet (4 mg total) by mouth every 8 (eight) hours as needed for Nausea., Starting 04/30/2016, Until Discontinued, Print      vitamin B-12 (CYANOCOBALAMIN) 1000 MCG tablet Take 1,000 mcg by mouth daily., Until Discontinued, Historical Med      vitamin D, cholecalciferol, 400 units tablet Take 400 Units by mouth daily., Until Discontinued, Historical Med       !! - Potential duplicate medications found. Please discuss with provider.                  This note was generated by the Epic EMR system/ Dragon speech recognition and may contain inherent errors or omissions not intended by the user. Grammatical errors, random word insertions, deletions and pronoun errors  are occasional consequences of this technology due to software limitations. Not all errors are caught or corrected. If there are questions or concerns about the content of this note or information contained within the body of this dictation they should be addressed directly with the author for clarification.       Suzy Bouchard, MD  02/26/21 2203

## 2021-02-26 NOTE — ED Triage Notes (Signed)
pt with CP, EKG in triage. urgent care send her to ED for labs/eval, recently had shingles.

## 2021-02-26 NOTE — Progress Notes (Signed)
[facility]  PROGRESS NOTE     Patient: Maureen Vasquez   Date: 02/26/2021   MRN: [id number]       Maureen Vasquez is a 87 y.o. female      HISTORY     History obtained from: Patient    Chief Complaint   Patient presents with    Chest Pain     Chest pain on right side under breast dev after a fall on 12/16/20, intermittent throughout the day, take Tylenol, has rash under left breast        HPI Maureen Vasquez complains of chest pain under right breast.  Pt fainted 2/3 and hit her head. unsure of how long pt was out for. Pt reports she was filling dizzy and fainted and woke up on the floor. Pt did not get evaluated when this this occurred.  Pt has pcp appointment 4/22 and called PCP due to chest pain and told to come to urgent care for chest x-ray.  The patient describes the pain as intermittent, sharp in nature, does not radiate. Patient rates pain as a 4/10 in intensity.  Associated symptoms are dyspnea. And sob when walking and talking since January.  Aggravating factors are walking and moving right arm and laying on left side.  Alleviating factors are: tylenol some. Patient's cardiac risk factors are advanced age (older than 21 for men, 18 for women) and dyslipidemia.  Patient's risk factors for DVT/PE: history of malignancy. Previous cardiac testing: electrocardiogram (ECG).    Pt has shingles back in January and still has the rash under left breast.          Takes tylenol, and helps with the pain and when it wears off pain if more.  Review of Systems    History:    Pertinent Past Medical, Surgical, Family and Social History were reviewed.        Current Outpatient Medications:     docusate sodium (COLACE) 100 MG capsule, Take 100 mg by mouth 2 (two) times daily., Disp: , Rfl:     lactobacillus/streoptococcus (RISAQUAD) Cap, Take 1 capsule by mouth daily., Disp: 30 capsule, Rfl: 0    MAGNESIUM CARBONATE PO, Take by mouth., Disp: , Rfl:     vitamin B-12 (CYANOCOBALAMIN) 1000 MCG tablet, Take 1,000 mcg by  mouth daily., Disp: , Rfl:     vitamin D, cholecalciferol, 400 units tablet, Take 400 Units by mouth daily., Disp: , Rfl:     benzonatate (TESSALON) 100 MG capsule, Take 1 capsule (100 mg total) by mouth 3 (three) times daily as needed for Cough., Disp: 20 capsule, Rfl: 0    lidocaine (LIDODERM) 5 %, Place 1 patch onto the skin daily as needed. Remove & Discard patch within 12 hours or as directed by MD, Disp: 6 each, Rfl: 0    mirtazapine (REMERON) 15 MG tablet, Take 1 tablet (15 mg total) by mouth nightly., Disp: 30 tablet, Rfl: 0    ondansetron (ZOFRAN-ODT) 4 MG disintegrating tablet, Take 1 tablet (4 mg total) by mouth every 8 (eight) hours as needed for Nausea., Disp: 15 tablet, Rfl: 0    Allergies   Allergen Reactions    Aspirin Other (See Comments)     Oral and stomach irritation if taken daily    Codeine Nausea Only    Salicylates        Medications and Allergies reviewed.    PHYSICAL EXAM     Vitals:    02/26/21 1515  BP: 156/78   Pulse: 87   Resp: 16   Temp: 97.4 F (36.3 C)   TempSrc: Temporal   SpO2: 99%   Weight: 52.2 kg (115 lb)       Physical Exam  Chest:       Skin:     Findings: Rash present.         GEN: Appears well, in no apparent distress.     HEAD: Normocephalic, atraumatic  EYES: PERRL, no conjunctival injection, sclera anicteric, EOM intact  ENT: Ears normal, throat and pharynx normal   NECK: Supple, no adenopathy, no JVD   CHEST: Clear, without wheezes or rales  HEART: Regular rate and rhythm, normal S1, S2, no murmurs noted  ABDOMEN: Soft, nontender, no mass, no organomegaly  EXTREMITIES: Peripheral pulses palpable, no clubbing or cyanosis, no peripheral edema          Media Information                  Document Information    Photographic Image:  Photographs      02/26/2021 16:09   Attached To:   Office Visit on 02/26/21 with Kyra Manges, FNP     Source Information    Kyra Manges, FNP   [facility]             Media Information                  Document  Information    Photographic Image:  Photographs      02/26/2021 16:09   Attached To:   Office Visit on 02/26/21 with Kyra Manges, FNP     Source Information    Kyra Manges, FNP   [facility]         [facility]     There were no labs reviewed with this patient during the visit.      There were no x-rays reviewed with this patient during the visit.      No current facility-administered medications for this visit.        PROCEDURES     EKG Read    Date/Time: 02/26/2021 4:16 PM  Performed by: Kyra Manges, FNP  Authorized by: Kyra Manges, FNP     Previous ECG:     Previous ECG:  Compared to current    Similarity:  Changes noted  Interpretation:     Interpretation: non-specific    Rate:     ECG rate:  76  Rhythm:     Rhythm: sinus rhythm    QRS:     QRS axis:  Normal  Conduction:     Conduction: normal    ST segments:     ST segments:  Normal  T waves:     T waves: normal    Comments:      Non specific changes in v1 and v2 when compared to previous EKG from 03/28/2014          MEDICAL DECISION MAKING     History, physical, EKG and labs/studies most consistent with  Chest pain  Chart Review:  Prior PCP, Specialist and/or ED notes reviewed today: No  Prior labs/images/studies reviewed today: No       [place] Heart Score to Predict CAD as Cause of Chest Pain    Addition of the selected points:    No Yes   Female ?65 years or female ?55 years 0 +1   Known CAD, cerebrovascular  disease, or peripheral vascular disease 0 +1   Pain worse with exercise 0 +1   Pain reproducible with palpation +1 0   Patient assumes pain is cardiac 0 +1     Interpretation:  [place] Heart Score CAD risk Recommendation   0-2 3% Outpatient evaluation as needed   ?3 23% Consider urgent evaluation or inpatient admission     Differential Diagnosis: CAD, GERD, Chest wall pain, Costochondritis, Pneumonia, Pulmonary Embolism, Panic Attack, Aortic dissection, Heart failure and Pericarditis.         ASSESSMENT     Encounter Diagnosis   Name  Primary?    Chest pain, unspecified type Yes            PLAN      PLAN:   Nonspecific changes in V1 and v2 on EKG when compared to EKG from 03/28/2014  Pt to go to er for further evaluation for possible cardiac workup.    Offered ambulance. Patient and husband would like husband to drive pt to ed.   Report given to Dr. Theodoro Grist  At [facility]      Orders Placed This Encounter   Procedures    EKG Read     Requested Prescriptions      No prescriptions requested or ordered in this encounter       Discussed results and diagnosis with patient/family.  Reviewed warning signs for worsening condition, as well as, indications for follow-up with primary care physician and return to urgent care clinic.   Patient/family expressed understanding of instructions.     An After Visit Summary was given to the patient.

## 2021-03-01 LAB — ECG 12-LEAD
Atrial Rate: 92 {beats}/min
P Axis: 69 degrees
P-R Interval: 226 ms
Q-T Interval: 400 ms
QRS Duration: 72 ms
QTC Calculation (Bezet): 494 ms
R Axis: 41 degrees
T Axis: 46 degrees
Ventricular Rate: 92 {beats}/min

## 2021-03-05 ENCOUNTER — Ambulatory Visit (INDEPENDENT_AMBULATORY_CARE_PROVIDER_SITE_OTHER): Payer: Medicare Other | Admitting: Family Medicine

## 2021-03-05 ENCOUNTER — Encounter (INDEPENDENT_AMBULATORY_CARE_PROVIDER_SITE_OTHER): Payer: Self-pay | Admitting: Family Medicine

## 2021-03-05 VITALS — BP 120/66 | HR 88 | Temp 97.0°F | Ht 62.44 in | Wt 113.8 lb

## 2021-03-05 DIAGNOSIS — C8590 Non-Hodgkin lymphoma, unspecified, unspecified site: Secondary | ICD-10-CM

## 2021-03-05 DIAGNOSIS — R0602 Shortness of breath: Secondary | ICD-10-CM

## 2021-03-05 DIAGNOSIS — B0229 Other postherpetic nervous system involvement: Secondary | ICD-10-CM

## 2021-03-05 DIAGNOSIS — N814 Uterovaginal prolapse, unspecified: Secondary | ICD-10-CM

## 2021-03-05 DIAGNOSIS — R5383 Other fatigue: Secondary | ICD-10-CM

## 2021-03-05 NOTE — Progress Notes (Signed)
[facility] - Laurell Josephs                       Date of Exam: 03/05/2021 10:46 AM        Patient ID: Maureen Vasquez is a 87 y.o. female.  Attending Physician: Armandina Stammer, MD        Chief Complaint:    Chief Complaint   Patient presents with    Establish Care               HPI:    HPI: Ms. Leavens with h/o NHL in remission, shingles (11/2020), post herpetic neuralgia is here to establish care.   C/o fatigue and sob on exertion x 3 months.   She had R sided chest wall shingles outbreak in 11/2020. PHN since then. She was seen at the ER 02/26/21. Cardiac w/u with troponin and EKG wnl. Labs including lipase were unremarkable. Lidoderm patch was prescribed for pain relief which helps.    H/o uterine prolapse. Requesting referral to uro-gyn.               Problem List:    Patient Active Problem List   Diagnosis    Low back ache    Neutropenia    Syncope and collapse    NHL (non-Hodgkin's lymphoma)    Generalized weakness    Pneumonia    Nuclear sclerotic cataract of left eye    Cataract, nuclear sclerotic senile, left             Current Meds:    Outpatient Medications Marked as Taking for the 03/05/21 encounter (Office Visit) with Armandina Stammer, MD   Medication Sig Dispense Refill    lidocaine (LIDODERM) 5 % Place 1 patch onto the skin daily as needed. Remove & Discard patch within 12 hours or as directed by MD 6 each 0    MAGNESIUM CARBONATE PO Take by mouth.      vitamin B-12 (CYANOCOBALAMIN) 1000 MCG tablet Take 1,000 mcg by mouth daily.            Allergies:    Allergies   Allergen Reactions    Aspirin Other (See Comments)     Oral and stomach irritation if taken daily    Codeine Nausea Only    Salicylates              Past Surgical History:    Past Surgical History:   Procedure Laterality Date    APPENDECTOMY (OPEN)      Age of 19    EXCISION, SQUAMOUS CELL      Right hand/ 2000    EXTRACTION, CATARACT, PHACO, IOL Left 04/07/2016    Procedure: EXTRACTION, CATARACT, PHACO, IOL;  Surgeon:  Ellery Plunk, MD;  Location: [facility] SURGERY OR;  Service: Ophthalmology;  Laterality: Left;  LEFT PHACO W/IOL    PLACEMENT, MEDIPORT             Family History:    Family History   Problem Relation Age of Onset    Heart attack Mother     Heart disease Mother     Parkinsonism Mother     Hyperlipidemia Mother     Heart disease Sister     Heart disease Brother            Social History:    Social History     Tobacco Use    Smoking status: Never Smoker    Smokeless tobacco: Never Used  Vaping Use    Vaping Use: Never used   Substance Use Topics    Alcohol use: No    Drug use: No           The following sections were reviewed this encounter by the provider:   Tobacco   Allergies   Meds   Problems   Med Hx   Surg Hx   Fam Hx                Vital Signs:    BP 120/66 (BP Site: Left arm, Patient Position: Sitting, Cuff Size: Medium)    Pulse 88    Temp 97 F (36.1 C) (Temporal)    Ht 1.586 m (5' 2.44")    Wt 51.6 kg (113 lb 12.8 oz)    SpO2 97%    BMI 20.52 kg/m          ROS:    Review of Systems   Constitutional: Negative for fever.   HENT: Negative for sore throat.    Respiratory: Positive for shortness of breath. Negative for cough.    Cardiovascular: Negative for chest pain and palpitations.   Gastrointestinal: Negative for nausea and vomiting.   Neurological: Negative for dizziness.              Physical Exam:    Physical Exam  Vitals reviewed.   Constitutional:       Appearance: Normal appearance.   Cardiovascular:      Rate and Rhythm: Normal rate and regular rhythm.      Heart sounds: Normal heart sounds.   Pulmonary:      Effort: Pulmonary effort is normal.      Breath sounds: Normal breath sounds.   Abdominal:      Palpations: Abdomen is soft. There is no mass.      Tenderness: There is no abdominal tenderness. There is no guarding.   Musculoskeletal:      Right lower leg: No edema.      Left lower leg: No edema.   Skin:     Comments: R anterolateral chest wall has residual erythema from  shingles. No blisters. Mild tenderness, no warmth.   Neurological:      Mental Status: She is alert.              Assessment:    1. Fatigue, unspecified type  - Cardiology Referral: Madelon Lips, MD ([facility])    2. SOB (shortness of breath) on exertion    3. Post herpetic neuralgia    4. Uterine prolapse  - Ambulatory referral to Urogynecology    5. Non-Hodgkin's lymphoma, unspecified body region, unspecified non-Hodgkin lymphoma type            Plan:    Fatigue/ SOB: Symptom onset x 3 months. S/p chemo for NHL in 2015. Referred to cardiology for evaluation.     PHN: [place] on Lidoderm patches. OTC capsaicin and Tylenol prn recommended.      Uterine prolapse: Referred to urogyn for consult and management as indicated.    NHL: In remission since treatment in 2015. CBC, CMP unremarkable 02/26/21.           Follow-up:     3 months        Armandina Stammer, MD

## 2021-04-05 NOTE — Progress Notes (Signed)
[facility] [place] OFFICE CONSULTATION    I had the pleasure of seeing [name] today for cardiovascular evaluation. She is a pleasant 87 y.o. female with a history of arthritis, non-Hodgkin's lymphoma diagnosed in 2013, hyperlipidemia, syncope    Initial visit:  ..........................  She presented to the emergency room on January 2022 and was diagnosed with shingles.  She was treated with valacyclovir, prednisone taper, oxycodone.      In February 2022, she had a syncopal episode with subsequent fall and head trauma.  She states that she did not eat or drink enough that day.  She was doing her laundry when she suddenly felt dizzy and fainted.  She woke up on the floor.  This episode was unwitnessed.  She was able to get up but felt very weak.  She had urine and stool incontinency after the episode.  She did not seek any medical attention at that time and thought that is related to dehydration.  She did not have any preceding symptoms such as palpitation nausea or vomiting prior to the event.      She started to have intermittent chest pain after her fall.  She was sent by her primary care physician to the emergency room 02/26/2021. Chest x-ray did not show any rib fracture.  Chest pain was sharp, nonradiating and nonpositional and nonexertional.  She had some shortness of breath.  Her EKG was unremarkable.  Troponin negative.  Chest pain was thought to be due to the postherpetic neuralgia. She was discharged with Lidoderm patch.    Since her shingles, she has been weak and has not been eating or drinking well and has lost more than 10 pounds.  She has been feeling bloated and has been having upper abdominal pain  She has been weak and has low energy for the past few months   She has occasional dizziness but denies any recurrent syncope.  Her dizziness is mostly positional, when she stands up quickly.  She just ran out of the lidoderm patch and her rib pain has returned.    PAST MEDICAL HISTORY: She  has a past medical history of Arthritis, Back pain, Ear, nose and throat disorder, Encounter for blood transfusion (2015), Gastroesophageal reflux disease, H/O acute pancreatitis (1990's), Low back pain (2014), Lymphona, mantle cell, inguinal region/lower limb, Malignant neoplasm (2014), NHL (non-Hodgkin's lymphoma) (October 2013), Post-operative nausea and vomiting, and Seasonal allergic rhinitis. She has a past surgical history that includes APPENDECTOMY (OPEN); EXCISION, SQUAMOUS CELL; PLACEMENT, MEDIPORT; and EXTRACTION, CATARACT, PHACO, IOL (Left, 04/07/2016).    MEDICATIONS:   Current Outpatient Medications:   .  Acetaminophen (TYLENOL PO), Take by mouth as needed, Disp: , Rfl:   .  docusate sodium (COLACE) 100 MG capsule, Take 100 mg by mouth as needed, Disp: , Rfl:   .  MAGNESIUM PO, Take by mouth as needed, Disp: , Rfl:   .  vitamin B-12 (CYANOCOBALAMIN) 1000 MCG tablet, Take 1,000 mcg by mouth daily., Disp: , Rfl:      ALLERGIES:   Allergies   Allergen Reactions   . Aspirin Other (See Comments)     Oral and stomach irritation if taken daily   . Codeine Nausea Only   . Salicylates        FAMILY HISTORY: Her family history includes Heart attack in her mother; Heart disease in her brother, mother, and sister; Hyperlipidemia in her mother; Parkinsonism in her mother.   5 of her siblings have passed away.   One  sister had mitral valve disease,     SOCIAL HISTORY: She reports that she has never smoked. She has never used smokeless tobacco. She reports that she does not drink alcohol and does not use drugs.  Drinks one glass of wine a day.   She is married   REVIEW OF SYSTEMS: All other systems reviewed and negative except as stated above.     PHYSICAL EXAMINATION  General Appearance: Elderly frail female in no acute distress.    Vital Signs: BP 145/89 Comment: left  Pulse 96   Ht 1.575 m (5\' 2" )   Wt 52.2 kg (115 lb)   BMI 21.03 kg/m    HEENT: Sclera anicteric, conjunctiva with pallor  Neck:  Supple without  jugular venous distention.   Chest: Clear to auscultation bilaterally with good air movement and respiratory effort and no wheezes, rales, or rhonchi   Rashes present related to prior shingles.  Cardiovascular: RRR, normal S1 , soft apical systolic murmurs, no gallops or rub.   Abdomen: Soft, nontender, nondistended, with normoactive bowel sounds.   Extremities: Warm without edema, Distal LE pulses are full  Neuro: Alert and oriented x3. Grossly intact. Strength is symmetrical. Normal mood and affect.       LABS   Lab Results   Component Value Date    WBC 7.49 02/26/2021    HGB 12.8 02/26/2021    HCT 38.4 02/26/2021    PLT 300 02/26/2021    NA 135 (L) 02/26/2021    K 4.2 02/26/2021    MG 1.6 03/05/2014    BUN 12 02/26/2021    CREAT 0.6 02/26/2021    GLU 99 02/26/2021    AST 18 02/26/2021    ALT 6 02/26/2021    TSH 2.57 03/02/2014    TROPI 0.01 02/26/2021    DDIMER 1.16 (H) 03/04/2014     No results for input(s): CHOL, TRIG, HDL, LDL in the last 306600 hours.      Cardiographics/Imagings:     ECG: 03/01/2021, normal sinus rhythm with 1st degree AVB,  low voltage, delayed R wave progression.    ECHOCARDIOGRAM: 2015    Normal LV size and function.   Aortic sclerosis.   Mild mitral regurgitation     IMPRESSION/RECOMMENDATIONS: Ms. Tranchina is a 87 y.o. female    Advanced age,    Arthritis,   History of non-Hodgkin lymphoma s/p R-CHOP(Rituximab cyclophosphamide doxorubicin vincristine prednisone) in 2015 in remission,   History of valvular disease, aortic valve sclerosis and mild mitral regurgitation, 2015.   History of shingles with postherpetic neuralgia in lower chest wall.   Syncope in 2/22, could be due to dehydration and orthostatic hypotension.  Was unwitnessed.  Patient has intermittent dizziness.   Atypical chest pain likely due to postherpetic neurolgia    Elevated BP with no diagnosis of HTN       Patient has been having poor appetite, generalized weakness and unintentional weight loss. She is also  complaining of right upper quadrant pain and abdominal discomfort/bloating. I emphasized that she should follow-up with her primary care physician for further work-up.     Will proceed with echocardiogram to rule out any structural heart disease.   Cardiac monitor to rule out arrhythmia.   Blood test ordered   Discussed the importance of hydration

## 2021-04-07 ENCOUNTER — Ambulatory Visit (INDEPENDENT_AMBULATORY_CARE_PROVIDER_SITE_OTHER): Payer: Medicare Other | Admitting: Internal Medicine

## 2021-04-07 ENCOUNTER — Encounter (HOSPITAL_BASED_OUTPATIENT_CLINIC_OR_DEPARTMENT_OTHER): Payer: Self-pay | Admitting: Internal Medicine

## 2021-04-07 VITALS — BP 156/77 | HR 73 | Ht 62.0 in | Wt 115.0 lb

## 2021-04-07 DIAGNOSIS — R42 Dizziness and giddiness: Secondary | ICD-10-CM

## 2021-04-07 DIAGNOSIS — R5383 Other fatigue: Secondary | ICD-10-CM

## 2021-04-07 DIAGNOSIS — R079 Chest pain, unspecified: Secondary | ICD-10-CM

## 2021-04-07 DIAGNOSIS — R55 Syncope and collapse: Secondary | ICD-10-CM

## 2021-04-07 DIAGNOSIS — Z8619 Personal history of other infectious and parasitic diseases: Secondary | ICD-10-CM

## 2021-04-07 DIAGNOSIS — R030 Elevated blood-pressure reading, without diagnosis of hypertension: Secondary | ICD-10-CM

## 2021-04-07 NOTE — Patient Instructions (Addendum)
Echo   Cardiac monitor  Blood test.   Follow up with your PCP for your Weight loss and fatigue and for complete blood test.

## 2021-04-08 ENCOUNTER — Telehealth (INDEPENDENT_AMBULATORY_CARE_PROVIDER_SITE_OTHER): Payer: Self-pay | Admitting: Family Medicine

## 2021-04-08 NOTE — Telephone Encounter (Signed)
Has been using lidocaine patches on the front and back instead of just on the back so she ran out early. Would like to know if she should get a refill or if she is okay to wait until the follow up. [phone]

## 2021-04-09 ENCOUNTER — Other Ambulatory Visit (INDEPENDENT_AMBULATORY_CARE_PROVIDER_SITE_OTHER): Payer: Self-pay | Admitting: Family Medicine

## 2021-04-09 NOTE — Telephone Encounter (Addendum)
Attempted to contact patient, no answer and I was unable to leave a voicemail message (box is full).

## 2021-04-09 NOTE — Telephone Encounter (Signed)
Please advise 

## 2021-04-09 NOTE — Telephone Encounter (Signed)
Pt call to update phone number : [phone]

## 2021-04-12 ENCOUNTER — Encounter (HOSPITAL_BASED_OUTPATIENT_CLINIC_OR_DEPARTMENT_OTHER): Payer: Self-pay | Admitting: Internal Medicine

## 2021-04-13 ENCOUNTER — Ambulatory Visit (INDEPENDENT_AMBULATORY_CARE_PROVIDER_SITE_OTHER): Payer: Medicare Other

## 2021-04-13 DIAGNOSIS — R079 Chest pain, unspecified: Secondary | ICD-10-CM

## 2021-04-13 DIAGNOSIS — Z8619 Personal history of other infectious and parasitic diseases: Secondary | ICD-10-CM

## 2021-04-13 DIAGNOSIS — R42 Dizziness and giddiness: Secondary | ICD-10-CM

## 2021-04-13 DIAGNOSIS — R030 Elevated blood-pressure reading, without diagnosis of hypertension: Secondary | ICD-10-CM

## 2021-04-13 DIAGNOSIS — R5383 Other fatigue: Secondary | ICD-10-CM

## 2021-04-13 DIAGNOSIS — R55 Syncope and collapse: Secondary | ICD-10-CM

## 2021-04-13 NOTE — Telephone Encounter (Signed)
Left voicemail to call office.

## 2021-04-13 NOTE — Progress Notes (Unsigned)
Biotel 7 Day monitor [id number] was placed on the patient. Detailed instructions given. The patient verbalized understanding.

## 2021-04-15 ENCOUNTER — Ambulatory Visit (INDEPENDENT_AMBULATORY_CARE_PROVIDER_SITE_OTHER): Payer: Medicare Other | Admitting: Family Medicine

## 2021-04-22 ENCOUNTER — Encounter (INDEPENDENT_AMBULATORY_CARE_PROVIDER_SITE_OTHER): Payer: Self-pay | Admitting: Family Medicine

## 2021-04-22 ENCOUNTER — Ambulatory Visit (INDEPENDENT_AMBULATORY_CARE_PROVIDER_SITE_OTHER): Payer: Medicare Other | Admitting: Family Medicine

## 2021-04-22 VITALS — BP 148/69 | HR 109 | Temp 97.2°F | Wt 113.8 lb

## 2021-04-22 DIAGNOSIS — I2510 Atherosclerotic heart disease of native coronary artery without angina pectoris: Secondary | ICD-10-CM

## 2021-04-22 DIAGNOSIS — R5383 Other fatigue: Secondary | ICD-10-CM

## 2021-04-22 DIAGNOSIS — D649 Anemia, unspecified: Secondary | ICD-10-CM

## 2021-04-22 DIAGNOSIS — Z780 Asymptomatic menopausal state: Secondary | ICD-10-CM

## 2021-04-22 DIAGNOSIS — E559 Vitamin D deficiency, unspecified: Secondary | ICD-10-CM

## 2021-04-22 DIAGNOSIS — B0229 Other postherpetic nervous system involvement: Secondary | ICD-10-CM

## 2021-04-22 DIAGNOSIS — E538 Deficiency of other specified B group vitamins: Secondary | ICD-10-CM

## 2021-04-22 LAB — IRON PROFILE
Iron Saturation: 27 % (ref 15–50)
Iron: 82 ug/dL (ref 40–145)
TIBC: 303 ug/dL (ref 265–497)
UIBC: 221 ug/dL (ref 126–382)

## 2021-04-22 LAB — VITAMIN D,25 OH,TOTAL: Vitamin D, 25 OH, Total: 10 ng/mL — ABNORMAL LOW (ref 30–100)

## 2021-04-22 LAB — VITAMIN B12: Vitamin B-12: >2000 pg/mL — ABNORMAL HIGH (ref 211–911)

## 2021-04-22 LAB — FERRITIN: Ferritin: 209.8 ng/mL — ABNORMAL HIGH (ref 4.60–204.00)

## 2021-04-22 LAB — HEMOLYSIS INDEX: Hemolysis Index: 7 (ref 0–24)

## 2021-04-22 NOTE — Progress Notes (Signed)
Have you seen any specialists/other providers since your last visit with [place]?    No    Do you agree to telemedicine visit?  No    Arm preference verified?   Yes, no preference    Health Maintenance Due   Topic Date Due   . FALLS RISK ANNUAL  Never done   . Advance Directive on File  Never done   . DXA Scan  Never done   . Medicare Annual Wellness Visit  Never done   . Shingrix Vaccine 50+ (1) Never done   . DEPRESSION SCREENING  Never done   . Pneumonia Vaccine Age 40+ (1 of 1 - PPSV23) Never done

## 2021-04-22 NOTE — Progress Notes (Signed)
n d            [facility] - Laurell Josephs                       Date of Exam: 04/22/2021 9:41 AM        Patient ID: Maureen Vasquez is a 87 y.o. female.  Attending Physician: Armandina Stammer, MD        Chief Complaint:    Chief Complaint   Patient presents with   . Hyperlipidemia     fasting             HPI:    HPI: Maureen Vasquez with h/o NHL in remission (chemo 2013-2014), shingles (11/2020), post herpetic neuralgia, syncopal episode 12/2020 is here for f/u.     C/o feeling sob on slight exertion, fatigue since after shingles 11/2020. Symptoms 2/2 deconditioning post hospitalization per cardiology evaluation on 04/07/21.   Syncopal episode was thought to be a vasovagal event after negative ER work up. She is scheduled for echo and event monitoring with her cardiologist.     Requesting a DEXA scan and carotid ultrasound.   Shingles pain has improved. Rash was on R sided chest wall.               Problem List:    Patient Active Problem List   Diagnosis   . Low back ache   . Neutropenia   . Syncope and collapse   . NHL (non-Hodgkin's lymphoma)   . Generalized weakness   . Pneumonia   . Nuclear sclerotic cataract of left eye   . Cataract, nuclear sclerotic senile, left             Current Meds:    Outpatient Medications Marked as Taking for the 04/22/21 encounter (Office Visit) with Armandina Stammer, MD   Medication Sig Dispense Refill   . Acetaminophen (TYLENOL PO) Take by mouth as needed     . vitamin B-12 (CYANOCOBALAMIN) 1000 MCG tablet Take 1,000 mcg by mouth daily.            Allergies:    Allergies   Allergen Reactions   . Aspirin Other (See Comments)     Oral and stomach irritation if taken daily   . Codeine Nausea Only   . Salicylates              Past Surgical History:    Past Surgical History:   Procedure Laterality Date   . APPENDECTOMY (OPEN)      Age of 8   . EXCISION, SQUAMOUS CELL      Right hand/ 2000   . EXTRACTION, CATARACT, PHACO, IOL Left 04/07/2016    Procedure: EXTRACTION, CATARACT, PHACO, IOL;  Surgeon: Ellery Plunk, MD;  Location: [facility] SUR[facility] OR;  Service: Ophthalmology;  Laterality: Left;  LEFT PHACO W/IOL   . PLACEMENT, MEDIPORT             Family History:    Family History   Problem Relation Age of Onset   . Heart attack Mother    . Heart disease Mother    . Parkinsonism Mother    . Hyperlipidemia Mother    . Heart disease Sister    . Heart disease Brother            Social History:    Social History     Tobacco Use   . Smoking status: Never Smoker   . Smokeless tobacco: Never Used  Vaping Use   . Vaping Use: Never used   Substance Use Topics   . Alcohol use: No   . Drug use: No           The following sections were reviewed this encounter by the provider:   Tobacco  Allergies  Meds  Problems  Med Hx  Surg Hx  Fam Hx               Vital Signs:    BP 148/69   Pulse (!) 109   Temp 97.2 F (36.2 C)   Wt 51.6 kg (113 lb 12.8 oz)   SpO2 98%   BMI 20.81 kg/m          ROS:    Review of Systems   Constitutional: Positive for fatigue. Negative for fever.   Respiratory: Positive for shortness of breath. Negative for cough.    Cardiovascular: Negative for chest pain and leg swelling.   Gastrointestinal: Negative for nausea and vomiting.   Neurological: Negative for dizziness.            Physical Exam:    Physical Exam  Vitals reviewed.   Constitutional:       Appearance: Normal appearance.   Cardiovascular:      Rate and Rhythm: Normal rate and regular rhythm.      Heart sounds: Normal heart sounds.   Pulmonary:      Effort: Pulmonary effort is normal.      Breath sounds: Normal breath sounds.   Musculoskeletal:      Right lower leg: No edema.      Left lower leg: No edema.   Skin:     Comments: Mild erythematous rash on R lateral chest wall. Non tender.    Neurological:      Mental Status: She is alert.              Assessment:    1. Coronary artery disease involving native heart without angina pectoris, unspecified vessel or lesion type  - US Carotid Duplex Dopp Comp Bilateral; Future    2. Fatigue,  unspecified type    3. Anemia, unspecified type  - Ferritin  - IRON PROFILE    4. Vitamin B12 deficiency  - Vitamin B12    5. Vitamin D deficiency, unspecified  - Vitamin D,25 OH, Total    6. Post-menopausal  - Dxa Bone Density Axial Skeleton; Future    7. Post herpetic neuralgia            Plan:    CAD: non obstructive. Check carotid ultrasound as requested by pt. In f/u with cardiology. Check CMP, FLP, TSH (ordered by cardiologist).    Fatigue: Check labs as above. Cardiac stress test was in 2015. Discuss with cardiologist to consider repeating it. Echo and event monitoring is scheduled with cardiologist this week.     Menopausal: Dexa scan ordered. Check vit D. Weight bearing exercises recommended.    Post herpetic neuralgia: Improving. Advised to use OTC topical lidocaine patches prn. May use Salon pas patches. Tylenol prn recommended.             Follow-up:     3 months        Armandina Stammer, MD

## 2021-04-23 ENCOUNTER — Other Ambulatory Visit (INDEPENDENT_AMBULATORY_CARE_PROVIDER_SITE_OTHER): Payer: Self-pay | Admitting: Family Medicine

## 2021-04-23 MED ORDER — ERGOCALCIFEROL 1.25 MG (50000 UT) PO CAPS
50000.0000 [IU] | ORAL_CAPSULE | ORAL | 1 refills | Status: AC
Start: 2021-04-23 — End: 2021-10-20

## 2021-04-26 ENCOUNTER — Ambulatory Visit (INDEPENDENT_AMBULATORY_CARE_PROVIDER_SITE_OTHER): Payer: Medicare Other

## 2021-04-26 DIAGNOSIS — R55 Syncope and collapse: Secondary | ICD-10-CM

## 2021-04-26 DIAGNOSIS — R030 Elevated blood-pressure reading, without diagnosis of hypertension: Secondary | ICD-10-CM

## 2021-04-26 DIAGNOSIS — R42 Dizziness and giddiness: Secondary | ICD-10-CM

## 2021-04-26 DIAGNOSIS — Z8619 Personal history of other infectious and parasitic diseases: Secondary | ICD-10-CM

## 2021-04-26 DIAGNOSIS — R5383 Other fatigue: Secondary | ICD-10-CM

## 2021-04-26 DIAGNOSIS — R079 Chest pain, unspecified: Secondary | ICD-10-CM

## 2021-04-28 LAB — ECHOCARDIOGRAM ADULT COMPLETE W CLR/ DOPP WAVEFORM
AV Area (Cont Eq VTI): 2.043
AV Area (Cont Eq VTI): 2.05
AV Mean Gradient: 2
AV Peak Velocity: 99.7
Ao Root Diameter (2D): 2.8
BP Mod LV Ejection Fraction: 56.4
IVS Diastolic Thickness (2D): 1.33
LA Dimension (2D): 4.1
LA Volume Index (BP A-L): 0.032
LVID diastole (2D): 3.05
LVID systole (2D): 2.2
MV Area (PHT): 4.683
MV E/A: 0.5
MV E/A: 0.539
MV E/e' (Average): 8.342
Prox Ascending Aorta Diameter: 3.5
Pulmonary Valve Findings: NORMAL
RV Basal Diastolic Dimension: 2.86
RV Function: NORMAL
Site RA Size (AS): NORMAL
Site RV Size (AS): NORMAL
TAPSE: 1.83
Tricuspid Valve Findings: NORMAL

## 2021-05-03 DIAGNOSIS — R55 Syncope and collapse: Secondary | ICD-10-CM

## 2021-05-04 ENCOUNTER — Ambulatory Visit
Admission: RE | Admit: 2021-05-04 | Discharge: 2021-05-04 | Disposition: A | Discharge status: Home / Self Care | Payer: Medicare Other | Source: Ambulatory Visit | Attending: Family Medicine | Admitting: Family Medicine

## 2021-05-04 DIAGNOSIS — Z780 Asymptomatic menopausal state: Secondary | ICD-10-CM

## 2021-05-07 ENCOUNTER — Ambulatory Visit (INDEPENDENT_AMBULATORY_CARE_PROVIDER_SITE_OTHER): Payer: Medicare Other

## 2021-05-07 DIAGNOSIS — I2510 Atherosclerotic heart disease of native coronary artery without angina pectoris: Secondary | ICD-10-CM

## 2021-05-11 ENCOUNTER — Telehealth (HOSPITAL_BASED_OUTPATIENT_CLINIC_OR_DEPARTMENT_OTHER): Payer: Self-pay

## 2021-05-11 NOTE — Telephone Encounter (Signed)
Per Dr Marily Lente echo.     Called pt to make aware; no answer; mailbox full unable to leave message

## 2021-05-12 ENCOUNTER — Encounter (HOSPITAL_BASED_OUTPATIENT_CLINIC_OR_DEPARTMENT_OTHER): Payer: Self-pay

## 2021-05-12 ENCOUNTER — Telehealth (INDEPENDENT_AMBULATORY_CARE_PROVIDER_SITE_OTHER): Payer: Self-pay | Admitting: Family Medicine

## 2021-05-12 ENCOUNTER — Telehealth (HOSPITAL_BASED_OUTPATIENT_CLINIC_OR_DEPARTMENT_OTHER): Payer: Self-pay | Admitting: Internal Medicine

## 2021-05-12 NOTE — Telephone Encounter (Signed)
Pt informed of lab and DEXA results.

## 2021-05-12 NOTE — Telephone Encounter (Signed)
Pt called would to discuss lab results   [phone]

## 2021-05-12 NOTE — Telephone Encounter (Signed)
Patient would like to receive the results of her recent exams.    Phone: [phone]    [facility]  [facility]

## 2021-05-12 NOTE — Telephone Encounter (Signed)
Per Dr Marily Lente echo.     Pt aware

## 2021-05-12 NOTE — Telephone Encounter (Signed)
Spoke to pt; questions answered

## 2021-06-03 ENCOUNTER — Telehealth (INDEPENDENT_AMBULATORY_CARE_PROVIDER_SITE_OTHER): Payer: Self-pay | Admitting: Family Medicine

## 2021-06-03 NOTE — Telephone Encounter (Signed)
Called and asked about the results for the US Carotid Duplex Dopp - please call   [phone]

## 2021-06-09 NOTE — Telephone Encounter (Signed)
Attempted to call pt, unable to leave VM.

## 2021-06-16 ENCOUNTER — Telehealth (INDEPENDENT_AMBULATORY_CARE_PROVIDER_SITE_OTHER): Payer: Self-pay | Admitting: Family Medicine

## 2021-06-16 NOTE — Telephone Encounter (Signed)
Pt. informed via phone call

## 2021-06-16 NOTE — Telephone Encounter (Signed)
Pt called would like to know her carotid artery results. Cb# [phone]

## 2021-07-16 ENCOUNTER — Emergency Department: Payer: Medicare Other

## 2021-07-16 ENCOUNTER — Observation Stay
Admission: EM | Admit: 2021-07-16 | Discharge: 2021-07-18 | Disposition: A | Discharge status: Home / Self Care | Payer: Medicare Other | Attending: Hospitalist | Admitting: Hospitalist

## 2021-07-16 DIAGNOSIS — N390 Urinary tract infection, site not specified: Principal | ICD-10-CM | POA: Insufficient documentation

## 2021-07-16 DIAGNOSIS — R42 Dizziness and giddiness: Secondary | ICD-10-CM | POA: Insufficient documentation

## 2021-07-16 DIAGNOSIS — R079 Chest pain, unspecified: Secondary | ICD-10-CM

## 2021-07-16 DIAGNOSIS — G3189 Other specified degenerative diseases of nervous system: Secondary | ICD-10-CM | POA: Insufficient documentation

## 2021-07-16 DIAGNOSIS — R519 Headache, unspecified: Secondary | ICD-10-CM | POA: Insufficient documentation

## 2021-07-16 DIAGNOSIS — E871 Hypo-osmolality and hyponatremia: Secondary | ICD-10-CM | POA: Insufficient documentation

## 2021-07-16 DIAGNOSIS — H269 Unspecified cataract: Secondary | ICD-10-CM | POA: Insufficient documentation

## 2021-07-16 DIAGNOSIS — R110 Nausea: Secondary | ICD-10-CM | POA: Insufficient documentation

## 2021-07-16 DIAGNOSIS — R531 Weakness: Secondary | ICD-10-CM | POA: Insufficient documentation

## 2021-07-16 LAB — URINALYSIS REFLEX TO MICROSCOPIC EXAM - REFLEX TO CULTURE
Bilirubin, UA: NEGATIVE
Blood, UA: NEGATIVE
Glucose, UA: NEGATIVE
Nitrite, UA: NEGATIVE
Protein, UR: NEGATIVE
Specific Gravity UA: 1.003 (ref 1.001–1.035)
Urine pH: 7 (ref 5.0–8.0)
Urobilinogen, UA: NORMAL mg/dL (ref 0.2–2.0)

## 2021-07-16 LAB — COMPREHENSIVE METABOLIC PANEL
ALT: 6 U/L (ref 0–55)
AST (SGOT): 16 U/L (ref 5–34)
Albumin/Globulin Ratio: 1.6 (ref 0.9–2.2)
Albumin: 4 g/dL (ref 3.5–5.0)
Alkaline Phosphatase: 79 U/L (ref 37–117)
Anion Gap: 12 (ref 5.0–15.0)
BUN: 11 mg/dL (ref 7.0–19.0)
Bilirubin, Total: 0.6 mg/dL (ref 0.2–1.2)
CO2: 24 mEq/L (ref 22–29)
Calcium: 9.4 mg/dL (ref 7.9–10.2)
Chloride: 94 mEq/L — ABNORMAL LOW (ref 100–111)
Creatinine: 0.7 mg/dL (ref 0.6–1.0)
Globulin: 2.5 g/dL (ref 2.0–3.6)
Glucose: 96 mg/dL (ref 70–100)
Potassium: 4.4 mEq/L (ref 3.5–5.1)
Protein, Total: 6.5 g/dL (ref 6.0–8.3)
Sodium: 130 mEq/L — ABNORMAL LOW (ref 136–145)

## 2021-07-16 LAB — CBC AND DIFFERENTIAL
Absolute NRBC: 0 10*3/uL (ref 0.00–0.00)
Basophils Absolute Automated: 0.03 10*3/uL (ref 0.00–0.08)
Basophils Automated: 0.4 %
Eosinophils Absolute Automated: 0.09 10*3/uL (ref 0.00–0.44)
Eosinophils Automated: 1.3 %
Hematocrit: 41 % (ref 34.7–43.7)
Hgb: 14.9 g/dL — ABNORMAL HIGH (ref 11.4–14.8)
Immature Granulocytes Absolute: 0.03 10*3/uL (ref 0.00–0.07)
Immature Granulocytes: 0.4 %
Lymphocytes Absolute Automated: 1.41 10*3/uL (ref 0.42–3.22)
Lymphocytes Automated: 20.3 %
MCH: 31.7 pg (ref 25.1–33.5)
MCHC: 36.3 g/dL — ABNORMAL HIGH (ref 31.5–35.8)
MCV: 87.2 fL (ref 78.0–96.0)
MPV: 9.4 fL (ref 8.9–12.5)
Monocytes Absolute Automated: 0.88 10*3/uL — ABNORMAL HIGH (ref 0.21–0.85)
Monocytes: 12.7 %
Neutrophils Absolute: 4.5 10*3/uL (ref 1.10–6.33)
Neutrophils: 64.9 %
Nucleated RBC: 0 /100 WBC (ref 0.0–0.0)
Platelets: 318 10*3/uL (ref 142–346)
RBC: 4.7 10*6/uL (ref 3.90–5.10)
RDW: 12 % (ref 11–15)
WBC: 6.94 10*3/uL (ref 3.10–9.50)

## 2021-07-16 LAB — GFR
EGFR: >60
EGFR: >60

## 2021-07-16 LAB — BASIC METABOLIC PANEL
Anion Gap: 8 (ref 5.0–15.0)
BUN: 9 mg/dL (ref 7.0–19.0)
CO2: 24 mEq/L (ref 22–29)
Calcium: 9.1 mg/dL (ref 7.9–10.2)
Chloride: 99 mEq/L — ABNORMAL LOW (ref 100–111)
Creatinine: 0.6 mg/dL (ref 0.6–1.0)
Glucose: 94 mg/dL (ref 70–100)
Potassium: 4.2 mEq/L (ref 3.5–5.1)
Sodium: 131 mEq/L — ABNORMAL LOW (ref 136–145)

## 2021-07-16 LAB — TROPONIN I: Troponin I: <0.01 ng/mL (ref 0.00–0.05)

## 2021-07-16 MED ORDER — CEFTRIAXONE SODIUM 1 G IJ SOLR
1.0000 g | INTRAMUSCULAR | Status: DC
Start: 2021-07-16 — End: 2021-07-17
  Administered 2021-07-16: 1 g via INTRAVENOUS
  Filled 2021-07-16: qty 1000

## 2021-07-16 MED ORDER — NALOXONE HCL 0.4 MG/ML IJ SOLN (WRAP)
0.2000 mg | INTRAMUSCULAR | Status: DC | PRN
Start: 2021-07-16 — End: 2021-07-18

## 2021-07-16 MED ORDER — DEXTROSE 50 % IV SOLN
25.0000 g | INTRAVENOUS | Status: DC | PRN
Start: 2021-07-16 — End: 2021-07-18

## 2021-07-16 MED ORDER — SODIUM CHLORIDE 0.9 % IV SOLN
INTRAVENOUS | Status: DC
Start: 2021-07-16 — End: 2021-07-18

## 2021-07-16 MED ORDER — ACETAMINOPHEN 325 MG PO TABS
650.0000 mg | ORAL_TABLET | Freq: Four times a day (QID) | ORAL | Status: AC | PRN
Start: 2021-07-16 — End: 2021-07-17

## 2021-07-16 MED ORDER — GLUCAGON 1 MG IJ SOLR (WRAP)
1.0000 mg | INTRAMUSCULAR | Status: DC | PRN
Start: 2021-07-16 — End: 2021-07-18

## 2021-07-16 MED ORDER — MELATONIN 3 MG PO TABS
3.0000 mg | ORAL_TABLET | Freq: Every evening | ORAL | Status: DC | PRN
Start: 2021-07-16 — End: 2021-07-18

## 2021-07-16 MED ORDER — HEPARIN SODIUM (PORCINE) 5000 UNIT/ML IJ SOLN
5000.0000 [IU] | Freq: Three times a day (TID) | INTRAMUSCULAR | Status: DC
Start: 2021-07-17 — End: 2021-07-18
  Administered 2021-07-17 – 2021-07-18 (×4): 5000 [IU] via SUBCUTANEOUS
  Filled 2021-07-16 (×4): qty 1

## 2021-07-16 MED ORDER — ONDANSETRON HCL 4 MG/2ML IJ SOLN
4.0000 mg | Freq: Four times a day (QID) | INTRAMUSCULAR | Status: DC | PRN
Start: 2021-07-16 — End: 2021-07-18

## 2021-07-16 MED ORDER — ACETAMINOPHEN 650 MG RE SUPP
650.0000 mg | Freq: Four times a day (QID) | RECTAL | Status: DC | PRN
Start: 2021-07-16 — End: 2021-07-18

## 2021-07-16 MED ORDER — ONDANSETRON 4 MG PO TBDP
4.0000 mg | ORAL_TABLET | Freq: Four times a day (QID) | ORAL | Status: DC | PRN
Start: 2021-07-16 — End: 2021-07-18

## 2021-07-16 MED ORDER — LACTATED RINGERS IV BOLUS
1000.0000 mL | Freq: Once | INTRAVENOUS | Status: AC
Start: 2021-07-16 — End: 2021-07-16
  Administered 2021-07-16: 1000 mL via INTRAVENOUS

## 2021-07-16 MED ORDER — SENNOSIDES-DOCUSATE SODIUM 8.6-50 MG PO TABS
1.0000 | ORAL_TABLET | Freq: Every evening | ORAL | Status: DC
Start: 2021-07-16 — End: 2021-07-18
  Administered 2021-07-17: 1 via ORAL
  Filled 2021-07-16: qty 1

## 2021-07-16 MED ORDER — DEXTROSE 10 % IV BOLUS
25.0000 g | INTRAVENOUS | Status: DC | PRN
Start: 2021-07-16 — End: 2021-07-18

## 2021-07-16 MED ORDER — DEXTROSE 5% IV BOLUS
250.0000 mL | INTRAVENOUS | Status: DC | PRN
Start: 2021-07-16 — End: 2021-07-18

## 2021-07-16 MED ORDER — ACETAMINOPHEN 325 MG PO TABS
650.0000 mg | ORAL_TABLET | Freq: Four times a day (QID) | ORAL | Status: DC | PRN
Start: 2021-07-16 — End: 2021-07-18
  Administered 2021-07-18: 325 mg via ORAL
  Filled 2021-07-16: qty 2

## 2021-07-16 MED ORDER — PROCHLORPERAZINE EDISYLATE 10 MG/2ML IJ SOLN
10.0000 mg | Freq: Once | INTRAMUSCULAR | Status: AC
Start: 2021-07-16 — End: 2021-07-16
  Administered 2021-07-16: 10 mg via INTRAVENOUS
  Filled 2021-07-16: qty 2

## 2021-07-16 MED ORDER — SODIUM CHLORIDE 0.9 % IV MBP
1.0000 g | INTRAVENOUS | Status: DC
Start: 2021-07-17 — End: 2021-07-18
  Administered 2021-07-17: 1 g via INTRAVENOUS
  Filled 2021-07-16 (×2): qty 1000

## 2021-07-16 NOTE — ED Triage Notes (Signed)
Pt states vision disturbances since Tuesday.  Pt states that she has also had a headache that she thought was allergies since Tuesday as well.  While talking to this RN, pt is closing her right eye.

## 2021-07-16 NOTE — ED Provider Notes (Signed)
History     Chief Complaint   Patient presents with    Dizziness    Facial Pain    Generalized weakness     87 year old who comes in with left sided headache, diplopia and generally feeling unwell since Tuesday.  Patient thinks it started when she began taking Benadryl on Tuesday for seasonal allergies.  She has had intermittent left-sided headache associated with intermittent diplopia and intermittent lightheadedness.  She denies vertigo.  She has been able to ambulate.  Denies any vomiting, has been able to tolerate p.o.  Denies any bowel or bladder symptoms.  Patient does have a right sided cataract that she has not had addressed.  Symptoms are subacute in onset located in the left side of the face and the head, without obvious exacerbating or relieving factors although she believes it was incited by the Benadryl.      Dizziness  Associated symptoms: headaches    Associated symptoms: no chest pain and no shortness of breath       Past Medical History:   Diagnosis Date    Arthritis     Rt hand index finger    Back pain     Ear, nose and throat disorder     sinus issues    Encounter for blood transfusion 2015    Gastroesophageal reflux disease     H/O acute pancreatitis 1990's    Low back pain 2014    3 slipped discs    Lymphona, mantle cell, inguinal region/lower limb     Malignant neoplasm 2014    Non Hodgkins lymphoma/s/p chemo    NHL (non-Hodgkin's lymphoma) October 2013    w/ Splenic Nodules    Post-operative nausea and vomiting     Cardiac arrest 34 yrs ago with ruptured appendix/also in 2000 w/endoscopy    Seasonal allergic rhinitis        Past Surgical History:   Procedure Laterality Date    APPENDECTOMY (OPEN)      Age of 17    EXCISION, SQUAMOUS CELL      Right hand/ 2000    EXTRACTION, CATARACT, PHACO, IOL Left 04/07/2016    Procedure: EXTRACTION, CATARACT, PHACO, IOL;  Surgeon: Ellery Plunk, MD;  Location: [facility] SURGERY OR;  Service: Ophthalmology;  Laterality: Left;  LEFT PHACO W/IOL     PLACEMENT, MEDIPORT         Family History   Problem Relation Age of Onset    Heart attack Mother     Heart disease Mother     Parkinsonism Mother     Hyperlipidemia Mother     Heart disease Sister     Heart disease Brother        Social  Social History     Tobacco Use    Smoking status: Never    Smokeless tobacco: Never   Vaping Use    Vaping Use: Never used   Substance Use Topics    Alcohol use: No    Drug use: No       .     Allergies   Allergen Reactions    Aspirin Other (See Comments)     Oral and stomach irritation if taken daily    Codeine Nausea Only    Salicylates        Home Medications       Med List Status: In Progress Set By: Chales Salmon, RN at 07/16/2021  6:44 PM  Acetaminophen (TYLENOL PO)     Take by mouth as needed     docusate sodium (COLACE) 100 MG capsule     Take 100 mg by mouth as needed     ergocalciferol (ERGOCALCIFEROL) 1.25 MG (50000 UT) capsule     Take 1 capsule (50,000 Units total) by mouth once a week     MAGNESIUM PO     Take by mouth as needed     vitamin B-12 (CYANOCOBALAMIN) 1000 MCG tablet     Take 1,000 mcg by mouth daily.             Review of Systems   Constitutional:  Negative for activity change and appetite change.   Eyes:  Negative for photophobia.   Respiratory:  Negative for chest tightness and shortness of breath.    Cardiovascular:  Negative for chest pain and leg swelling.   Gastrointestinal:  Negative for abdominal distention and abdominal pain.   Genitourinary:  Negative for difficulty urinating and dysuria.   Musculoskeletal:  Negative for gait problem and myalgias.   Skin:  Negative for rash and wound.   Neurological:  Positive for light-headedness and headaches. Negative for seizures and syncope.   Psychiatric/Behavioral:  Negative for agitation and behavioral problems.    All other systems reviewed and are negative.    Physical Exam    BP: 164/74, Heart Rate: 96, Temp: 97.5 F (36.4 C), Resp Rate: 18, SpO2: 99 %    Physical Exam  Vitals and  nursing note reviewed.   Constitutional:       Appearance: She is well-developed.   HENT:      Head: Normocephalic and atraumatic.   Eyes:      General: Lids are normal.      Conjunctiva/sclera: Conjunctivae normal.   Cardiovascular:      Rate and Rhythm: Normal rate and regular rhythm.      Pulses: Normal pulses.      Heart sounds: Normal heart sounds.   Pulmonary:      Effort: Pulmonary effort is normal. No tachypnea or respiratory distress.      Breath sounds: Normal breath sounds.   Abdominal:      General: Abdomen is flat. There is no distension.   Musculoskeletal:         General: No swelling. Normal range of motion.      Cervical back: Normal range of motion. No rigidity.   Skin:     General: Skin is warm and dry.   Neurological:      Mental Status: She is alert. Mental status is at baseline.      GCS: GCS eye subscore is 4. GCS verbal subscore is 5. GCS motor subscore is 6.      Cranial Nerves: Cranial nerves are intact.      Sensory: Sensation is intact.      Motor: Motor function is intact.      Coordination: Coordination is intact.   Psychiatric:         Attention and Perception: Attention and perception normal.         Mood and Affect: Mood normal.         Judgment: Judgment normal.         MDM and ED Course     ED Medication Orders (From admission, onward)      Start Ordered     Status Ordering Provider    07/16/21 2323 07/16/21 2323  acetaminophen (TYLENOL) tablet 650 mg  Every 6  hours PRN        Route: Oral  Ordered Dose: 650 mg     [place] Constable    07/16/21 2237 07/16/21 2236  cefTRIAXone (ROCEPHIN) injection 1 g  Every 24 hours        Route: Intravenous  Ordered Dose: 1 g     Last MAR action: Given Waldemar Dickens    07/16/21 1919 07/16/21 1918  prochlorperazine (COMPAZINE) injection 10 mg  Once        Route: Intravenous  Ordered Dose: 10 mg     Last MAR action: Given Waldemar Dickens    07/16/21 1919 07/16/21 1918  lactated ringers bolus 1,000 mL  Once        Route: Intravenous   Ordered Dose: 1,000 mL     Last MAR action: Stopped KESSLER, RAYMOND H               MDM  Number of Diagnoses or Management Options  Acute UTI  Dizziness  Hyponatremia  Diagnosis management comments: MDM:    -Maureen Vasquez is a 87 year old who comes in with left sided headache, diplopia and generally feeling unwell since Tuesday.  Patient thinks it started when she began taking Benadryl on Tuesday for seasonal allergies.  She has had intermittent left-sided headache associated with intermittent diplopia and intermittent lightheadedness.  She denies vertigo.  She has been able to ambulate.  Denies any vomiting, has been able to tolerate p.o.  Denies any bowel or bladder symptoms.  Patient does have a right sided cataract that she has not had addressed.  Symptoms are subacute in onset located in the left side of the face and the head, without obvious exacerbating or relieving factors although she believes it was incited by the Benadryl.    -Vitals hemodynamically stable and afebrile. Pertinent PE findings include resting comfortably, extraocular motions are intact, pupils equal round reactive to light, neuro exam is grossly normal with normal finger-to-nose bilaterally.     -DDx includes but is not limited to intracranial abnormality versus electrolyte derangement versus medication side effects.     -Given initial history and PE findings plan to obtain basic labs as well as a CT scan of the head definitively rule out acute intracranial abnormality.  Compazine and fluids for symptomatic control and will reassess.  Given normal neurologic exam concern for any acute or emergent pathology is much lower at this time.  Likely medication side effect from Benadryl.Rolena Infante, MD  Emergency Medicine  07/16/2021 7:21 PM    This note may have been dictated on a word recognition program.  There may be word recognition mistakes that are occasionally missed on review.         Amount and/or Complexity of Data  Reviewed  Clinical lab tests: ordered and reviewed  Tests in the radiology section of CPT: ordered and reviewed  Tests in the medicine section of CPT: ordered and reviewed  Decide to obtain previous medical records or to obtain history from someone other than the patient: yes          ED Course as of 07/16/21 2324   Fri Jul 16, 2021   1859 ECG 12 lead  Interpreted independently by myself in the absence of a cardiologist, normal sinus rhythm at a rate of 94 beats per minutes with first-degree AV block, incomplete right bundle branch block, no evidence of acute ischemia or infarction or acute right heart strain,  QTC 467.  Previous EKG reviewed and this is grossly unchanged from previous. [RK]   2055 CT Head without Contrast  1. No CT evidence of acute intracranial hemorrhage, herniation or  hydrocephalus.       2. Cerebral volume loss and sequela of chronic ischemic disease. [RK]   2055 Chest AP Portable  Hyperinflation. Slight progression of peripheral interstitial opacities  more pronounced at the lung bases, which may reflect chronic  interstitial lung disease. [RK]   2141 Basic Metabolic Panel(!)  Sodium unchanged after fluids [RK]   2141 CBC and differential(!)  wnl [RK]   2235 Urinalysis Reflex to Microscopic Exam- Reflex to Culture(!)  Patient has a urinary tract infection.  We will give a dose of ceftriaxone. [RK]   2323 Patient persistently symptomatic even after treatment.  States she feels unsteady on her feet.  Given hyponatremia UTI and persistent symptomatology and feels unsafe ambulating will admit to observation.  Case discussed with the hospitalist. [RK]      ED Course User Index  [RK] Waldemar Dickens, MD         NIH Stroke Score      Flowsheet Row Most Recent Value   Patient's calculated Stroke Score: 1 filed at 07/16/2021 1854            Procedures    Clinical Impression & Disposition     Clinical Impression  Final diagnoses:   Acute UTI   Hyponatremia   Dizziness        ED Disposition       ED  Disposition   Observation    Condition   --    Date/Time   Fri Jul 16, 2021 2323    Comment   Admitting Physician: CALY, PELLUM [[id number]]   Service:: Medicine [[id number]]   Estimated Length of Stay: < 2 midnights   Tentative Discharge Plan?: Home or Self Care [1]   Does patient need telemetry?: No                  New Prescriptions    No medications on file                   Waldemar Dickens, MD  07/16/21 2324

## 2021-07-16 NOTE — ED Notes (Signed)
[facility] EMERGENCY DEPARTMENT  ED NURSING NOTE FOR THE RECEIVING INPATIENT NURSE   ED NURSE Robb Matar [phone]   ED CHARGE RN Kal [phone]   ADMISSION INFORMATION   Maureen Vasquez is a 87 y.o. female admitted with an ED diagnosis of:    1. Acute UTI    2. Hyponatremia    3. Dizziness         Isolation: None   Allergies: Aspirin, Codeine, and Salicylates   Holding Orders confirmed? Yes   Belongings Documented? Yes   Home medications sent to pharmacy confirmed? N/A   NURSING CARE   Patient Comes From:   Mental Status: Home Independent  alert and oriented   ADL: Independent with all ADLs   Ambulation: mild difficulty   Pertinent Information  and Safety Concerns: NA     CT / NIH   CT Head ordered on this patient?  Yes   NIH/Dysphagia assessment done prior to admission? Yes   VITAL SIGNS (at the time of this note)      Vitals:    07/16/21 2331   BP: (!) 170/98   Pulse: (!) 105   Resp: 15   Temp: 97.9 F (36.6 C)   SpO2: 98%

## 2021-07-17 ENCOUNTER — Observation Stay: Payer: Medicare Other

## 2021-07-17 LAB — BASIC METABOLIC PANEL
Anion Gap: 9 (ref 5.0–15.0)
BUN: 9 mg/dL (ref 7.0–19.0)
CO2: 24 mEq/L (ref 22–29)
Calcium: 9 mg/dL (ref 7.9–10.2)
Chloride: 102 mEq/L (ref 100–111)
Creatinine: 0.6 mg/dL (ref 0.6–1.0)
Glucose: 85 mg/dL (ref 70–100)
Potassium: 4 mEq/L (ref 3.5–5.1)
Sodium: 135 mEq/L — ABNORMAL LOW (ref 136–145)

## 2021-07-17 LAB — ECG 12-LEAD
Atrial Rate: 94 {beats}/min
P Axis: 58 degrees
P-R Interval: 236 ms
Q-T Interval: 374 ms
QRS Duration: 100 ms
QTC Calculation (Bezet): 467 ms
R Axis: 77 degrees
T Axis: 57 degrees
Ventricular Rate: 94 {beats}/min

## 2021-07-17 LAB — CBC
Absolute NRBC: 0 10*3/uL (ref 0.00–0.00)
Hematocrit: 38.1 % (ref 34.7–43.7)
Hgb: 13.2 g/dL (ref 11.4–14.8)
MCH: 30.3 pg (ref 25.1–33.5)
MCHC: 34.6 g/dL (ref 31.5–35.8)
MCV: 87.6 fL (ref 78.0–96.0)
MPV: 10.3 fL (ref 8.9–12.5)
Nucleated RBC: 0 /100 WBC (ref 0.0–0.0)
Platelets: 315 10*3/uL (ref 142–346)
RBC: 4.35 10*6/uL (ref 3.90–5.10)
RDW: 12 % (ref 11–15)
WBC: 7.5 10*3/uL (ref 3.10–9.50)

## 2021-07-17 LAB — GFR: EGFR: >60

## 2021-07-17 MED ORDER — GADOBUTROL 1 MMOL/ML IV SOSY (WRAP)
5.0000 mL | Freq: Once | INTRAVENOUS | Status: AC | PRN
Start: 2021-07-17 — End: 2021-07-17
  Administered 2021-07-17: 5 mL via INTRAVENOUS

## 2021-07-17 NOTE — PT Eval Note (Signed)
[facility]  Physical Therapy Evaluation    Patient: Maureen Vasquez MRN: [id number]   Unit: 4NEW MEDICAL    Bed: V409/W119-14      Attention Physicians:  For patients in an observation or outpatient status, Medicare requires the ordering provider to certify that this service is medically necessary.  Please co-sign this evaluation to indicate your agreement.  Thank you.      RECOMMENDATIONS:    Discharge Recommendation: Home with supervision     DME Recommended for Discharge: rolling walker    Recommended (non-medical) mode of transport: car        Assessment:   Patient gait assessed with eye patch on R eye due to diplopia. Pt is independent with gait with use of rolling walker and is agreeable to using device in home setting. Pt is able to climb steps independently.     No continued inpatient physical therapy needs.   Discharge therapy.        Examination - WFL lower extemity range of motion and muscle strength throughout.     Co-morbidities/Patient factors affecting plan of care - shingles in 1/22, stairs in home    Clinical factors affecting plan of care - diplopia (better with eye patch for gait)         PMP - Progressive Mobility Protocol   PMP Activity: Step 7 - Walks out of Room  Distance Walked (ft) (Step 6,7): 250 Feet        Interdisciplinary Communication:   Patient is in bed with alarm activated; call bell within reach.  Updated white communication board in room with patient's current mobility status. Spoke with RN regarding results of evaluation.    Plan:   Goals:  All physical therapy goals met on evaluation.  Discontinue PT.        Education:   Educated patient on role of physical therapy and no further needs for inpatient PT.  Patient verbalized understanding and in agreement with discharge.    Evaluation:   Consult received for Maureen Vasquez for PT evaluation and treatment.  Chart reviewed.  Patient's medical condition is appropriate for Physical Therapy intervention at this time.      Medical Diagnosis: Dizziness [R42]  Hyponatremia [E87.1]  Acute UTI [N39.0]    Rehab diagnosis: Generalized weakness    Precautions:    falls risk                             History of Present Illness: Maureen Vasquez is a 87 y.o. female admitted on 07/16/2021 with left sided headache, diplopia and generally feeling unwell   Patient with double vision which disappears when she closes her R eye  Also blurry vision  MRI brain pending, Neuro consult pending    Patient Active Problem List   Diagnosis    Low back ache    Neutropenia    Syncope and collapse    NHL (non-Hodgkin's lymphoma)    Generalized weakness    Pneumonia    Nuclear sclerotic cataract of left eye    Cataract, nuclear sclerotic senile, left    Acute UTI     Past Medical History:   Diagnosis Date    Arthritis     Rt hand index finger    Back pain     Ear, nose and throat disorder     sinus issues    Encounter for blood transfusion 2015    Gastroesophageal  reflux disease     H/O acute pancreatitis 1990's    Low back pain 2014    3 slipped discs    Lymphona, mantle cell, inguinal region/lower limb     Malignant neoplasm 2014    Non Hodgkins lymphoma/s/p chemo    NHL (non-Hodgkin's lymphoma) October 2013    w/ Splenic Nodules    Post-operative nausea and vomiting     Cardiac arrest 34 yrs ago with ruptured appendix/also in 2000 w/endoscopy    Seasonal allergic rhinitis      Past Surgical History:   Procedure Laterality Date    APPENDECTOMY (OPEN)      Age of 89    EXCISION, SQUAMOUS CELL      Right hand/ 2000    EXTRACTION, CATARACT, PHACO, IOL Left 04/07/2016    Procedure: EXTRACTION, CATARACT, PHACO, IOL;  Surgeon: Ellery Plunk, MD;  Location: [facility] SURGERY OR;  Service: Ophthalmology;  Laterality: Left;  LEFT PHACO W/IOL    PLACEMENT, MEDIPORT                 X-Rays/Tests/Labs:    Lab Results   Component Value Date    WBC 7.50 07/17/2021    HGB 13.2 07/17/2021    HCT 38.1 07/17/2021    MCV 87.6 07/17/2021    PLT 315 07/17/2021       CT Head  without Contrast    Result Date: 07/16/2021   1. No CT evidence of acute intracranial hemorrhage, herniation or hydrocephalus.   2. Cerebral volume loss and sequela of chronic ischemic disease. Neldon Mc, MD  07/16/2021 8:29 PM    Chest AP Portable    Result Date: 07/16/2021  Hyperinflation. Slight progression of peripheral interstitial opacities more pronounced at the lung bases, which may reflect chronic interstitial lung disease. Georgiana Spinner, MD  07/16/2021 6:56 PM           Prior Level of Function   Prior level of function: Ambulates / Performs ADL's independently   Baseline Activity Level: Community ambulation   DME Currently at Home: none      Home Living Arrangements   Pt lives with husband in a multi story home. Ambulates without devices.     Subjective: Patient is agreeable to participation in the therapy session. Nursing clears patient for therapy.         Pain Assessment  0/10     Objective:  Observation of patient/vitals    Blood pressure 156/83, pulse (!) 101, temperature 97.5 F (36.4 C), temperature source Oral, resp. rate 16, height 1.422 m (4\' 8" ), weight 49.9 kg (110 lb), SpO2 97 %.      Cognition: intact        Inspection/Posture: IV meds      Musculoskeletal Examination  Gross ROM: Within functional limits B LE  Gross Strength: Within functional limits B LE      Sensation: intact light touch BLEs      Functional Mobility  Transfers: independent  Ambulation: 250 ft with RW independently -- agreeable to use RW in home setting  Stairs: 4 steps with 2 rails independently      Balance: within functional limits                  Treatment:    PT eval and safety education with pt          MD cosign needed : Y      Time Calculation  PT Received On: 07/17/21  Start Time:  1610  Stop Time: 1015  Time Calculation (min): 25 min          Signature: [employer], PT  07/17/2021

## 2021-07-17 NOTE — OT Eval Note (Addendum)
[facility]   Occupational Therapy Evaluation     Patient: Maureen Vasquez    MRN#: [id number]   Unit: 4NEW MEDICAL  Bed: J478/G956-21    Attention Physicians:  For patients in an observation or outpatient status, Medicare requires the ordering provider to certify that this service is medically necessary.  Please co-sign this evaluation to indicate your agreement.  Thank you.    Time of treatment: Time Calculation  OT Received On: 07/17/21  Start Time: 1330  Stop Time: 1345  Time Calculation (min): 15 min    Consult received for Maureen Vasquez for OT Evaluation and Treatment.  Patient's medical condition is appropriate for Occupational therapy intervention at this time.      OT Recommendations  Discharge Recommendation: Home with supervision  DME Recommended for Discharge: No additional equipment/DME recommended at this time    Assessment:   Pt is at baseline of supervision with ADL and functional mobility. Pt continues to experience monocular horizontal diplopia relieved by covering R eye. Ocular ROM is WNL. Convergence is WNL with no exophoria/esophoria observed. Difficulty with visual pursuit, saccades and confrontation to visual fields due to diplopia in R upper, middle and lower quadrant, possibly due to R eye cataract. Recommend pt follow up with Ophthalmologist upon discharge. No con't acute OT needs. Discharge from therapy.    Interdisciplinary Communication   Patient is in bed and call bell/bed alarm set within reach.  Spoke with RN regarding results of evaluation.    Plan:   Discharge from Occupational Therapy.    Education:   Ed pt on role of OT and home safety with ADL.    Evaluation:     Precautions and Contraindications:   Precautions  Weight Bearing Status: no restrictions  Other Precautions:  (falls, diplopia)      Medical Diagnosis: Dizziness [R42]  Hyponatremia [E87.1]  Acute UTI [N39.0]      History of Present Illness: Maureen Vasquez is a 87 y.o. female admitted on 07/16/2021 with  left  sided headache, diplopia and generally feeling unwell   Patient with double vision which disappears when she closes her R eye  Also blurry vision  MRI brain pending, Neuro consult pending    Patient Active Problem List   Diagnosis    Low back ache    Neutropenia    Syncope and collapse    NHL (non-Hodgkin's lymphoma)    Generalized weakness    Pneumonia    Nuclear sclerotic cataract of left eye    Cataract, nuclear sclerotic senile, left    Acute UTI        Past Medical/Surgical History:  Past Medical History:   Diagnosis Date    Arthritis     Rt hand index finger    Back pain     Ear, nose and throat disorder     sinus issues    Encounter for blood transfusion 2015    Gastroesophageal reflux disease     H/O acute pancreatitis 1990's    Low back pain 2014    3 slipped discs    Lymphona, mantle cell, inguinal region/lower limb     Malignant neoplasm 2014    Non Hodgkins lymphoma/s/p chemo    NHL (non-Hodgkin's lymphoma) October 2013    w/ Splenic Nodules    Post-operative nausea and vomiting     Cardiac arrest 34 yrs ago with ruptured appendix/also in 2000 w/endoscopy    Seasonal allergic rhinitis  Past Surgical History:   Procedure Laterality Date    APPENDECTOMY (OPEN)      Age of 65    EXCISION, SQUAMOUS CELL      Right hand/ 2000    EXTRACTION, CATARACT, PHACO, IOL Left 04/07/2016    Procedure: EXTRACTION, CATARACT, PHACO, IOL;  Surgeon: Ellery Plunk, MD;  Location: [facility] SURGERY OR;  Service: Ophthalmology;  Laterality: Left;  LEFT PHACO W/IOL    PLACEMENT, MEDIPORT           Social History/Prior level of function:  Prior Level of Function  Prior level of function: Independent with ADLs;Ambulates with assistive device  Assistive Device: Front wheel walker  Home Living Arrangements  Living Arrangements: Spouse/significant other    Orientation/Cognition: WFL    Pain: denies pain    Gross UE ROM: WFL    Gross UE strength: WFL    ADLs: Supervision    Functional mobility: Supervision with  RW        Signature: Lennox Laity, OT

## 2021-07-17 NOTE — Progress Notes (Signed)
Admission Note-  Patient admitted to Medical#  [id number] at 0100. Hand off report received from ED ISHAPED.    Patient oriented to room, bed in lowest position, bed alarm activated, call bell within reach.   Fall prevention contract completed. Yes/No  Patient informed of visitor policy.  Belongings Recorded on chart.     Brief Clinical Picture:  Neuro AXOX4; Resp -16,  O2 status; Room Air, Foley or [employer]; None, GI: Continent x 2.     Skin Assessment  Two nurse skin assessment performed with: Second RN Melinda    Areas observed with redness or injury: Blanchable redness on Bilateral heels and ears, scar on Back    Devices present:None       Braden score <15:     IV infusing.  No complain opf pain.  Fall precaution maintained.

## 2021-07-17 NOTE — H&P (Signed)
[facility]      Patient: Maureen Vasquez  Date: 07/16/2021   DOB: 20-Jun-1934  Admission Date: 07/16/2021   MRN: [id number]  Attending: Dionicia Abler MD       Chief Complaint   Patient presents with    Dizziness    Facial Pain    Generalized weakness      History Gathered From: Self    HISTORY AND PHYSICAL     Maureen Vasquez is a 87 y.o. female with a PMHx of cataract, GERD, NHL s/p chemo, recent shingles infection in 11/2020, seasonal allergies who presented with headache, nausea, diplopia and generally feeling unwell since Tuesday night.     Patient denies recent falls, dizziness, chest pain, cough, fever, chills, dysuria, abdominal pain, diarrhea, numbness or tingling in hands or feet.        SH: see below    FH: see below    Past Medical History:   Diagnosis Date    Arthritis     Rt hand index finger    Back pain     Ear, nose and throat disorder     sinus issues    Encounter for blood transfusion 2015    Gastroesophageal reflux disease     H/O acute pancreatitis 1990's    Low back pain 2014    3 slipped discs    Lymphona, mantle cell, inguinal region/lower limb     Malignant neoplasm 2014    Non Hodgkins lymphoma/s/p chemo    NHL (non-Hodgkin's lymphoma) October 2013    w/ Splenic Nodules    Post-operative nausea and vomiting     Cardiac arrest 34 yrs ago with ruptured appendix/also in 2000 w/endoscopy    Seasonal allergic rhinitis        Past Surgical History:   Procedure Laterality Date    APPENDECTOMY (OPEN)      Age of 67    EXCISION, SQUAMOUS CELL      Right hand/ 2000    EXTRACTION, CATARACT, PHACO, IOL Left 04/07/2016    Procedure: EXTRACTION, CATARACT, PHACO, IOL;  Surgeon: Ellery Plunk, MD;  Location: [facility];  Service: Ophthalmology;  Laterality: Left;  LEFT PHACO W/IOL    PLACEMENT, MEDIPORT         Prior to Admission medications    Medication Sig Start Date End Date Taking? Authorizing Provider   Acetaminophen (TYLENOL PO) Take by mouth as needed   Yes [provider]   MAGNESIUM PO Take by mouth as needed   Yes [provider]   vitamin B-12 (CYANOCOBALAMIN) 1000 MCG tablet Take 1,000 mcg by mouth daily.   Yes [provider]   docusate sodium (COLACE) 100 MG capsule Take 100 mg by mouth as needed    [provider]   ergocalciferol (ERGOCALCIFEROL) 1.25 MG (50000 UT) capsule Take 1 capsule (50,000 Units total) by mouth once a week 04/23/21 10/20/21  Armandina Stammer, MD       Allergies   Allergen Reactions    Aspirin Other (See Comments)     Oral and stomach irritation if taken daily    Codeine Nausea Only    Salicylates        CODE STATUS: full code    PRIMARY CARE MD: Armandina Stammer, MD    Family History   Problem Relation Age of Onset    Heart attack Mother     Heart disease Mother     Parkinsonism Mother  Hyperlipidemia Mother     Heart disease Sister     Heart disease Brother        Social History     Tobacco Use    Smoking status: Never    Smokeless tobacco: Never   Vaping Use    Vaping Use: Never used   Substance Use Topics    Alcohol use: No    Drug use: No       REVIEW OF SYSTEMS     Ten point review of systems negative or as per HPI and below endorsements.    PHYSICAL EXAM   PPE used:yes  Vital Signs (most recent): BP 164/72    Pulse (!) 104    Temp 97.5 F (36.4 C) (Oral)    Resp 16    SpO2 96%   Constitutional: No apparent distress.  Patient speaks freely in full sentences.   HEENT: NC/AT, PERRL, no scleral icterus or conjunctival pallor, no nasal discharge, MMM, oropharynx without erythema or exudate  Neck: trachea midline, supple  Cardiovascular: RRR, normal S1 S2, no murmurs, gallops, palpable thrills, no JVD, Non-displaced PMI.  Respiratory: Normal rate. No retractions or increased work of breathing. Clear to auscultation and percussion bilaterally.  Gastrointestinal: +BS, non-distended, soft, non-tender, no rebound or guarding  Genitourinary: no suprapubic or costovertebral angle tenderness  Musculoskeletal: ROM and motor strength  grossly normal. No clubbing, edema, or cyanosis. DP and radial pulses 2+ and symmetric.  Capillary refill normal  Skin: healing shingles rash on R side of back  Neurologic: EOMI, CN 2-12 grossly intact. no gross motor or sensory deficits, gait not assessed, strong hand grip, motor strength 5/5 in all ext, no facial asymmetry, finger nose test normal  Psychiatric: AAOx3, affect and mood appropriate. The patient is alert, interactive, appropriate.      LABS & IMAGING     Recent Results (from the past 24 hour(s))   CBC and differential    Collection Time: 07/16/21  6:39 PM   Result Value Ref Range    WBC 6.94 3.10 - 9.50 x10 3/uL    Hgb 14.9 (H) 11.4 - 14.8 g/dL    Hematocrit 16.1 09.6 - 43.7 %    Platelets 318 142 - 346 x10 3/uL    RBC 4.70 3.90 - 5.10 x10 6/uL    MCV 87.2 78.0 - 96.0 fL    MCH 31.7 25.1 - 33.5 pg    MCHC 36.3 (H) 31.5 - 35.8 g/dL    RDW 12 11 - 15 %    MPV 9.4 8.9 - 12.5 fL    Neutrophils 64.9 None %    Lymphocytes Automated 20.3 None %    Monocytes 12.7 None %    Eosinophils Automated 1.3 None %    Basophils Automated 0.4 None %    Immature Granulocytes 0.4 None %    Nucleated RBC 0.0 0.0 - 0.0 /100 WBC    Neutrophils Absolute 4.50 1.10 - 6.33 x10 3/uL    Lymphocytes Absolute Automated 1.41 0.42 - 3.22 x10 3/uL    Monocytes Absolute Automated 0.88 (H) 0.21 - 0.85 x10 3/uL    Eosinophils Absolute Automated 0.09 0.00 - 0.44 x10 3/uL    Basophils Absolute Automated 0.03 0.00 - 0.08 x10 3/uL    Immature Granulocytes Absolute 0.03 0.00 - 0.07 x10 3/uL    Absolute NRBC 0.00 0.00 - 0.00 x10 3/uL   Comprehensive metabolic panel    Collection Time: 07/16/21  6:39 PM   Result Value Ref Range  Glucose 96 70 - 100 mg/dL    BUN 16.1 7.0 - 09.6 mg/dL    Creatinine 0.7 0.6 - 1.0 mg/dL    Sodium 045 (L) 409 - 145 mEq/L    Potassium 4.4 3.5 - 5.1 mEq/L    Chloride 94 (L) 100 - 111 mEq/L    CO2 24 22 - 29 mEq/L    Calcium 9.4 7.9 - 10.2 mg/dL    Protein, Total 6.5 6.0 - 8.3 g/dL    Albumin 4.0 3.5 - 5.0 g/dL    AST  (SGOT) 16 5 - 34 U/L    ALT 6 0 - 55 U/L    Alkaline Phosphatase 79 37 - 117 U/L    Bilirubin, Total 0.6 0.2 - 1.2 mg/dL    Globulin 2.5 2.0 - 3.6 g/dL    Albumin/Globulin Ratio 1.6 0.9 - 2.2    Anion Gap 12.0 5.0 - 15.0   Troponin I    Collection Time: 07/16/21  6:39 PM   Result Value Ref Range    Troponin I <0.01 0.00 - 0.05 ng/mL   GFR    Collection Time: 07/16/21  6:39 PM   Result Value Ref Range    EGFR >60.0     Basic Metabolic Panel    Collection Time: 07/16/21  9:18 PM   Result Value Ref Range    Glucose 94 70 - 100 mg/dL    BUN 9.0 7.0 - 81.1 mg/dL    Creatinine 0.6 0.6 - 1.0 mg/dL    Calcium 9.1 7.9 - 91.4 mg/dL    Sodium 782 (L) 956 - 145 mEq/L    Potassium 4.2 3.5 - 5.1 mEq/L    Chloride 99 (L) 100 - 111 mEq/L    CO2 24 22 - 29 mEq/L    Anion Gap 8.0 5.0 - 15.0   GFR    Collection Time: 07/16/21  9:18 PM   Result Value Ref Range    EGFR >60.0     Urinalysis Reflex to Microscopic Exam- Reflex to Culture    Collection Time: 07/16/21 10:04 PM   Result Value Ref Range    Urine Type Urine, Clean Ca     Color, UA Straw Colorless - Yellow    Clarity, UA Clear Clear - Hazy    Specific Gravity UA 1.003 1.001 - 1.035    Urine pH 7.0 5.0 - 8.0    Leukocyte Esterase, UA Large (A) Negative    Nitrite, UA Negative Negative    Protein, UR Negative Negative    Glucose, UA Negative Negative    Ketones UA Trace (A) Negative    Urobilinogen, UA Normal 0.2 - 2.0 mg/dL    Bilirubin, UA Negative Negative    Blood, UA Negative Negative    RBC, UA 3 - 5 0 - 5 /hpf    WBC, UA TNTC (A) 0 - 5 /hpf    Squamous Epithelial Cells, Urine 0 - 5 0 - 25 /hpf    Yeast, UA Occasional (A) None       MICROBIOLOGY:  Microbiology Results (last 15 days)       Procedure Component Value Units Date/Time    Urine culture [[id number]] Collected: 07/16/21 2204    Order Status: No result Specimen: Urine Updated: 07/16/21 2221            Antibiotics Started: ceftriaxone    IMAGING (xray images personally reviewed and concur with radiologist unless  otherwise stated below):  CT Head without Contrast   Final  Result          1. No CT evidence of acute intracranial hemorrhage, herniation or   hydrocephalus.         2. Cerebral volume loss and sequela of chronic ischemic disease.      Neldon Mc, MD    07/16/2021 8:29 PM      Chest AP Portable   Final Result         Hyperinflation. Slight progression of peripheral interstitial opacities   more pronounced at the lung bases, which may reflect chronic   interstitial lung disease.      Georgiana Spinner, MD    07/16/2021 6:56 PM          CARDIAC:  EKG Interpretation (on my review 3):  sinus rhythm    Markers:  Recent Labs   Lab 07/16/21  1839   Troponin I <0.01       EMERGENCY DEPARTMENT COURSE:  Orders Placed This Encounter   Procedures    Urine culture    Chest AP Portable    CT Head without Contrast    CBC and differential    Comprehensive metabolic panel    Troponin I    GFR    Basic Metabolic Panel    GFR    Urinalysis Reflex to Microscopic Exam- Reflex to Culture    CBC without differential    Basic Metabolic Panel    GFR    Diet regular    Vital Signs    Activity as Tolerated    ED Holding Orders Expire in 8 Hours    Notify Admitting Attending ( Change in Condition)    Notify Attending of Patient Arrival to Floor within 8 Hours    Notify Physician (Vital Signs)    Notify Physician (Lab Results)    Vital Signs Q4HR    Vital signs    Pulse Oximetry    Progressive Mobility Protocol    Notify physician    NSG Communication: Glucose POCT order (PRN hypoglycemia)    I/O    Height    Weight    Skin assessment    Nursing communication: Adult Hypoglycemia Treatment Algorithm    Place sequential compression device    Maintain sequential compression device    Education: Activity    Education: Disease Process & Condition    Education: Pain Management    Education: Falls Risk    Education: Smoking Cessation    Incentive spirometry nursing    Full Code    OT eval and treat    PT evaluate and treat    ECG 12 lead    Saline lock IV     Saline lock IV    Adult Admit to Observation       ASSESSMENT & PLAN     Maureen Vasquez is a 87 y.o. female admitted under OBSERVATION with UTI and hyponatremia.      UTI  Start Ceftriaxone  Follow urine cx      2. Dizziness, nausea maybe in setting of mild hyponatremia   Na 131 on admission, usually around 135  CTH negative   No focal deficits  Check orthostatics  IVF  Can consider MRI Brain to rule out stroke though less likely  Trend Na  PT/OT  Fall precautions      3. Nutrition  Regular diet      Safety Checklist  DVT prophylaxis: Chemical   Foley: Not present   IVs:  Peripheral IV   PT/OT: Ordered  Daily CBC & or Chem ordered: Yes, due to clinical and lab instability       Patient Lines/Drains/Airways Status       Active PICC Line / CVC Line / PIV Line / Drain / Airway / Intraosseous Line / Epidural Line / ART Line / Line / Wound / Pressure Ulcer / NG/OG Tube       Name Placement date Placement time Site Days    Peripheral IV 07/16/21 20 G Left Antecubital 07/16/21  1841  Antecubital  less than 1                    Anticipated medical stability for discharge: TBD    All questions answered to the satisfaction of patient and family. Patient made aware that I or covering hospitalist team member are easily reachable to further discuss care if questions or symptoms arise. Instructed to notify nurse if change in condition or other concerns.     Rodman Pickle MD  07/17/2021 4:03 AM    This note was generated by the [facility] EMR system/Dragon speech recognition and may contain inherent errors or omissions not intended by the user. Grammatical errors, random word insertions, deletions, pronoun errors and incomplete sentences are occasional consequences of this technology due to software limitations. Not all errors are caught or corrected. If there are questions or concerns about the content of this note or information contained within the body of this dictation they should be addressed directly with the author for  clarification.

## 2021-07-17 NOTE — Progress Notes (Addendum)
Same day follow up:  VSS  Patient with double vision which disappears when she closes her R eye  Also blurry vision  Has R catarract but didn't do the surgery due to the pandemic  Also occasional numbness RUE    Plan:  Check MRI brain w/wo contrast r/o CVA or MS  Neuro consult  PT OT  F/U urine culture  Hyponatremia improved  BP has been elevated, will hold off on treatment until we r/o CVA  Not really on any prescription meds at home

## 2021-07-17 NOTE — UM Notes (Signed)
Adult Admit to Observation [[id number]]    Electronically signed by: Waldemar Dickens, MD on 07/16/21 2323 Status: Completed   Ordering user: Waldemar Dickens, MD 07/16/21 2323 Ordering provider: Waldemar Dickens, MD   Authorized by: Waldemar Dickens, MD   Cosigning events   Electronically cosigned by Dionicia Abler, MD 07/16/21 2325 for Ordering    Add Signature Requirement   Frequency: Once 07/16/21 2323 - 1  occurrence   Questionnaire    Question Answer   Admitting Physician Dionicia Abler   Service: Medicine   Estimated Length of Stay < 2 midnights   Tentative Discharge Plan? Home or Self Care   Does patient need telemetry? No     Updates    Diagnosis: Acute UTI [N39.0] Level of care: Acute   Patient class: Observation     87 y.o. female who presented with headache, nausea, diplopia and generally feeling unwell since Tuesday night.     PMH: cataract, GERD, NHL s/p chemo, recent shingles infection in 11/2020, seasonal allergies     OBSERVATION with UTI and hyponatremia.    ED meds: tylenol 650mg  po, rocephin 1g IV, compazine 10mg  IV, LR 1L bolus    BP 156/83    Pulse (!) 101    Temp 97.5 F (36.4 C) (Oral)    Resp 16    Ht 1.422 m (4\' 8" )    Wt 49.9 kg (110 lb)    SpO2 97%    BMI 24.66 kg/m       Plan:  UTI  Start Ceftriaxone  Follow urine cx     2. Dizziness, nausea maybe in setting of mild hyponatremia   Na 131 on admission, usually around 135  CTH negative   No focal deficits  Check orthostatics  IVF  Can consider MRI Brain to rule out stroke though less likely  Trend Na  PT/OT  Fall precautions     3. Nutrition  Regular diet        Gillian Scarce  UR Case Manager, MSN, RN  [facility]  [phone]  [email or web address]

## 2021-07-18 DIAGNOSIS — R531 Weakness: Secondary | ICD-10-CM

## 2021-07-18 LAB — BASIC METABOLIC PANEL
Anion Gap: 9 (ref 5.0–15.0)
BUN: 8 mg/dL (ref 7.0–19.0)
CO2: 22 mEq/L (ref 22–29)
Calcium: 9.2 mg/dL (ref 7.9–10.2)
Chloride: 103 mEq/L (ref 100–111)
Creatinine: 0.6 mg/dL (ref 0.6–1.0)
Glucose: 87 mg/dL (ref 70–100)
Potassium: 3.8 mEq/L (ref 3.5–5.1)
Sodium: 134 mEq/L — ABNORMAL LOW (ref 136–145)

## 2021-07-18 LAB — CBC AND DIFFERENTIAL
Absolute NRBC: 0 10*3/uL (ref 0.00–0.00)
Basophils Absolute Automated: 0.05 10*3/uL (ref 0.00–0.08)
Basophils Automated: 0.7 %
Eosinophils Absolute Automated: 0.11 10*3/uL (ref 0.00–0.44)
Eosinophils Automated: 1.4 %
Hematocrit: 40.4 % (ref 34.7–43.7)
Hgb: 14.3 g/dL (ref 11.4–14.8)
Immature Granulocytes Absolute: 0.03 10*3/uL (ref 0.00–0.07)
Immature Granulocytes: 0.4 %
Lymphocytes Absolute Automated: 1.91 10*3/uL (ref 0.42–3.22)
Lymphocytes Automated: 25 %
MCH: 31.1 pg (ref 25.1–33.5)
MCHC: 35.4 g/dL (ref 31.5–35.8)
MCV: 87.8 fL (ref 78.0–96.0)
MPV: 9.5 fL (ref 8.9–12.5)
Monocytes Absolute Automated: 1.13 10*3/uL — ABNORMAL HIGH (ref 0.21–0.85)
Monocytes: 14.8 %
Neutrophils Absolute: 4.4 10*3/uL (ref 1.10–6.33)
Neutrophils: 57.7 %
Nucleated RBC: 0 /100 WBC (ref 0.0–0.0)
Platelets: 309 10*3/uL (ref 142–346)
RBC: 4.6 10*6/uL (ref 3.90–5.10)
RDW: 12 % (ref 11–15)
WBC: 7.63 10*3/uL (ref 3.10–9.50)

## 2021-07-18 LAB — C-REACTIVE PROTEIN: C-Reactive Protein: 0.3 mg/dL (ref 0.0–0.8)

## 2021-07-18 LAB — SEDIMENTATION RATE: Sed Rate: 11 mm/Hr (ref 0–20)

## 2021-07-18 LAB — HEPATIC FUNCTION PANEL
ALT: 6 U/L (ref 0–55)
AST (SGOT): 15 U/L (ref 5–34)
Albumin/Globulin Ratio: 1.5 (ref 0.9–2.2)
Albumin: 3.7 g/dL (ref 3.5–5.0)
Alkaline Phosphatase: 73 U/L (ref 37–117)
Bilirubin Direct: 0.2 mg/dL (ref 0.0–0.5)
Bilirubin Indirect: 0.3 mg/dL (ref 0.2–1.0)
Bilirubin, Total: 0.5 mg/dL (ref 0.2–1.2)
Globulin: 2.4 g/dL (ref 2.0–3.6)
Protein, Total: 6.1 g/dL (ref 6.0–8.3)

## 2021-07-18 LAB — GFR: EGFR: >60

## 2021-07-18 MED ORDER — BUTALBITAL-APAP-CAFFEINE 50-325-40 MG PO TABS
1.0000 | ORAL_TABLET | Freq: Three times a day (TID) | ORAL | 0 refills | Status: DC | PRN
Start: 2021-07-18 — End: 2021-12-15

## 2021-07-18 MED ORDER — BUTALBITAL-APAP-CAFFEINE 50-325-40 MG PO TABS
1.0000 | ORAL_TABLET | Freq: Three times a day (TID) | ORAL | Status: DC | PRN
Start: 2021-07-18 — End: 2021-07-18

## 2021-07-18 MED ORDER — CEFUROXIME AXETIL 250 MG PO TABS
250.0000 mg | ORAL_TABLET | Freq: Two times a day (BID) | ORAL | 0 refills | Status: AC
Start: 2021-07-18 — End: 2021-07-20

## 2021-07-18 NOTE — Discharge Instr - AVS First Page (Addendum)
Reason for your Hospital Admission:  Double vision      Instructions for after your discharge:  -Should follow-up with Dr. Chales Abrahams the neurologist on the results of blood test for the myasthenia gravis panel  - Should follow-up with ophthalmology ASAP  --Can have Vitamin B12 vitamin D, TSH checked with your family physician

## 2021-07-18 NOTE — Plan of Care (Signed)
Problem: Safety  Goal: Patient will be free from injury during hospitalization  Outcome: Progressing  Flowsheets (Taken 07/18/2021 0007)  Patient will be free from injury during hospitalization:   Assess patient's risk for falls and implement fall prevention plan of care per policy   Provide and maintain safe environment   Use appropriate transfer methods   Ensure appropriate safety devices are available at the bedside   Include patient/ family/ care giver in decisions related to safety   Hourly rounding   Provide alternative method of communication if needed (communication boards, writing)  Goal: Patient will be free from infection during hospitalization  Outcome: Progressing  Flowsheets (Taken 07/18/2021 0007)  Free from Infection during hospitalization:   Assess and monitor for signs and symptoms of infection   Monitor lab/diagnostic results   Monitor all insertion sites (i.e. indwelling lines, tubes, urinary catheters, and drains)   Encourage patient and family to use good hand hygiene technique     Problem: Pain  Goal: Pain at adequate level as identified by patient  Outcome: Progressing  Flowsheets (Taken 07/18/2021 0007)  Pain at adequate level as identified by patient:   Identify patient comfort function goal   Assess pain on admission, during daily assessment and/or before any "as needed" intervention(s)   Evaluate if patient comfort function goal is met   Reassess pain within 30-60 minutes of any procedure/intervention, per Pain Assessment, Intervention, Reassessment (AIR) Cycle   Evaluate patient's satisfaction with pain management progress

## 2021-07-18 NOTE — Consults (Signed)
[facility] Consultation Note                                       Date Time: 07/18/21 11:43 AM  Patient Name: Maureen Vasquez  Requesting Physician: Drue Dun, MD  Date of Admission: 07/16/2021    CC / Reason for Consultation: Double/blurred vision      Assessment:     87 years old female admitted with generalized weakness, headache and double/blurred vision, MRI brain with and without contrast negative      Plan:     Fioricet as needed for breakthrough headaches  Myasthenia panel, ESR, CRP to rule out myasthenia versus temporal arteritis  Ophthalmological evaluation for double vision/blurred vision  Vitamin B12 vitamin D, TSH  PT/OT evaluation  Can follow-up as outpatient in 3 to 4 weeks    HPI   Maureen Vasquez is a 87 y.o. female who presents to the hospital with feeling generalized unwell for the last 4 to 5 days with intermittent left-sided headache, diplopia and lightheadedness.  She denies any any room spinning head spinning sensation.  There is mild ptosis noted on exam, the patient is unaware of it, she does report feeling fatigued on prolonged walking or talking but has not felt any droopiness of the eyelids before.  There is generalized weakness but she denies having any proximal weakness or any autoimmune disease.      Past Medical Hx     Past Medical History:   Diagnosis Date    Arthritis     Rt hand index finger    Back pain     Ear, nose and throat disorder     sinus issues    Encounter for blood transfusion 2015    Gastroesophageal reflux disease     H/O acute pancreatitis 1990's    Low back pain 2014    3 slipped discs    Lymphona, mantle cell, inguinal region/lower limb     Malignant neoplasm 2014    Non Hodgkins lymphoma/s/p chemo    NHL (non-Hodgkin's lymphoma) October 2013    w/ Splenic Nodules    Post-operative nausea and vomiting     Cardiac arrest 34 yrs ago with ruptured appendix/also in 2000 w/endoscopy    Seasonal allergic rhinitis           Past Surgical Hx:     Past Surgical  History:   Procedure Laterality Date    APPENDECTOMY (OPEN)      Age of 47    EXCISION, SQUAMOUS CELL      Right hand/ 2000    EXTRACTION, CATARACT, PHACO, IOL Left 04/07/2016    Procedure: EXTRACTION, CATARACT, PHACO, IOL;  Surgeon: Ellery Plunk, MD;  Location: [facility];  Service: Ophthalmology;  Laterality: Left;  LEFT PHACO W/IOL    PLACEMENT, MEDIPORT          Family Medical History:      Family History   Problem Relation Age of Onset    Heart attack Mother     Heart disease Mother     Parkinsonism Mother     Hyperlipidemia Mother     Heart disease Sister     Heart disease Brother        Social Hx     Social History     Socioeconomic History    Marital status: Married   Tobacco Use  Smoking status: Never    Smokeless tobacco: Never   Vaping Use    Vaping Use: Never used   Substance and Sexual Activity    Alcohol use: No    Drug use: No       Meds     Home :   Prior to Admission medications    Medication Sig Start Date End Date Taking? Authorizing Provider   Acetaminophen (TYLENOL PO) Take by mouth as needed   Yes [provider]   MAGNESIUM PO Take by mouth as needed   Yes [provider]   vitamin B-12 (CYANOCOBALAMIN) 1000 MCG tablet Take 1,000 mcg by mouth daily.   Yes [provider]   docusate sodium (COLACE) 100 MG capsule Take 100 mg by mouth as needed    [provider]   ergocalciferol (ERGOCALCIFEROL) 1.25 MG (50000 UT) capsule Take 1 capsule (50,000 Units total) by mouth once a week 04/23/21 10/20/21  Armandina Stammer, MD      Inpatient :   Current Facility-Administered Medications   Medication Dose Route Frequency    cefTRIAXone  1 g Intravenous Q24H    heparin (porcine)  5,000 Units Subcutaneous Q8H SCH    senna-docusate  1 tablet Oral QHS         Allergies    Aspirin, Codeine, and Salicylates      Review of Systems     All other systems were reviewed and are negative except for that mentioned in the HPI    Physical Exam:   Temp:  [97.3 F (36.3 C)-98.1  F (36.7 C)] 97.9 F (36.6 C)  Heart Rate:  [85-116] 116  Resp Rate:  [15-16] 16  BP: (137-176)/(73-91) 137/74     Vital Signs:  Reviewed    General: Well developed and well nourished. No acute distress. Cooperative with the exam  ENT: Normal oral mucosa, no ear or nose discharge  Neck: Symmetric, no deformities  CV: RRR  Resp: No audible wheezing, normal work of breathing  Abd: Soft, nondistended  Skin: Intact, extremities normal in color  Psych: Affect is normal, good insight    Mental Status: The patient was awake, alert, and oriented. Conversation   was appropriate. Speech was fluent, and the patient followed commands   consistently.  Cranial Nerves: Pupils were equally round and reactive to light.  Mild ptosis noted on the left with difficulty abducting the left eye completely,  The patient's face was symmetric,   and facial sensation was intact. Tongue and palate were midline.   Motor Exam: The patient had full strength throughout. Tone and bulk appeared   normal. There were no abnormal movements or tremor noted.   Sensation: Light touch, pinprick, and joint position sense were intact.   Coordination: Finger-to-nose and heel-to-shin testing was intact   without dysmetria. Romberg testing was negative.   Gait: Normal, with normal tandem walking.   Deep Tendon Reflexes: 2+ and symmetric throughout.   Toes were downgoing.        Labs:     Results       Procedure Component Value Units Date/Time    Urine culture [[id number]] Collected: 07/16/21 2204    Specimen: Bladder Updated: 07/18/21 1137    Narrative:      ORDER#: [id number]                                    ORDERED BY: Haynes Bast,  RAYMON  SOURCE: Urine                                        COLLECTED:  07/16/21 22:04  ANTIBIOTICS AT COLL.:                                RECEIVED :  07/16/21 22:08  Culture Urine                              FINAL       07/18/21 11:37   +  07/18/21   >100,000 CFU/ML of normal urogenital or skin microbiota,             including  Group B Streptococcus, no further work.      C Reactive Protein [[id number]] Collected: 07/18/21 0553     Updated: 07/18/21 1100     C-Reactive Protein 0.3 mg/dL     Sedimentation rate (ESR) [[id number]] Collected: 07/18/21 0553     Updated: 07/18/21 1056     Sed Rate 11 mm/Hr     Basic Metabolic Panel [[id number]]  (Abnormal) Collected: 07/18/21 0553    Specimen: Blood Updated: 07/18/21 0650     Glucose 87 mg/dL      BUN 8.0 mg/dL      Creatinine 0.6 mg/dL      Calcium 9.2 mg/dL      Sodium 086 mEq/L      Potassium 3.8 mEq/L      Chloride 103 mEq/L      CO2 22 mEq/L      Anion Gap 9.0    Hepatic function panel (LFT) [[id number]] Collected: 07/18/21 0553    Specimen: Blood Updated: 07/18/21 0650     Bilirubin, Total 0.5 mg/dL      Bilirubin Direct 0.2 mg/dL      Bilirubin Indirect 0.3 mg/dL      AST (SGOT) 15 U/L      ALT 6 U/L      Alkaline Phosphatase 73 U/L      Protein, Total 6.1 g/dL      Albumin 3.7 g/dL      Globulin 2.4 g/dL      Albumin/Globulin Ratio 1.5    GFR [[id number]] Collected: 07/18/21 0553     Updated: 07/18/21 0650     EGFR >60.0       CBC and differential [[id number]]  (Abnormal) Collected: 07/18/21 0553    Specimen: Blood Updated: 07/18/21 0617     WBC 7.63 x10 3/uL      Hgb 14.3 g/dL      Hematocrit 64.4 %      Platelets 309 x10 3/uL      RBC 4.60 x10 6/uL      MCV 87.8 fL      MCH 31.1 pg      MCHC 35.4 g/dL      RDW 12 %      MPV 9.5 fL      Neutrophils 57.7 %      Lymphocytes Automated 25.0 %      Monocytes 14.8 %      Eosinophils Automated 1.4 %      Basophils Automated 0.7 %      Immature Granulocytes 0.4 %      Nucleated RBC 0.0 /100 WBC  Neutrophils Absolute 4.40 x10 3/uL      Lymphocytes Absolute Automated 1.91 x10 3/uL      Monocytes Absolute Automated 1.13 x10 3/uL      Eosinophils Absolute Automated 0.11 x10 3/uL      Basophils Absolute Automated 0.05 x10 3/uL      Immature Granulocytes Absolute 0.03 x10 3/uL      Absolute NRBC 0.00 x10 3/uL             Rads:     Results for orders  placed or performed during the hospital encounter of 07/16/21   MRI Brain W WO Contrast    Narrative    HISTORY: Non-Hodgkin's lymphoma. Prior chemotherapy. Recent shingles .  Feeling unwell with headache, nausea, and diplopia.    COMPARISON: CT the head dated July 16, 2021 is available.    TECHNIQUE: MRI of the brain performed on a 1.5 Tesla scanner without and  with 5 mL of Gadavist intravenous contrast. A dedicated demyelinating  disease protocol was performed     FINDINGS: There is  mild bilateral cerebral and cerebellar  volume loss.  There is compensatory prominence of the extra axial CSF spaces and  ventricular system.     There is moderately prominent subcortical  and periventricular  leukomalacia in both cerebral hemispheres. Several of the white matter  lesions are associated with the lateral aspect of the corpus callosum  bilaterally.  These findings may represent chronic small vessel ischemic  changes. Other causes of white matter pathology, including chronic  demyelinating disease, could be considered in the appropriate clinical  setting.    No intracranial mass, hemorrhage, or hydrocephalus is seen.    The basilar cisterns are clear. The foramen magnum is patent. There is  no evidence of a Chiari malformation.    The diffusion weighted images are within the range of normal.  No acute  infarct is seen. There are appropriate flow-voids in the major arterial  and venous structures.     The paranasal sinuses show mild inflammatory changes. No fluid is seen  in the paranasal sinuses. The mastoid air cells are clear The  nasopharynx has a normal configuration.    There is a normal pattern of contrast enhancement.      Impression    1.  There is moderately prominent supratentorial leukomalacia that may  reflect chronic small vessel ischemic change versus the sequela of  chronic demyelinating disease.  2. No acute or recent infarct is detected.  3. No mass or hemorrhage is detected.  4. There is a normal  pattern of contrast enhancement.    Theodoro Doing, MD   07/17/2021 2:02 PM   CT Head without Contrast    Narrative    HISTORY: Headache, chronic, new features or increased frequency    COMPARISON: Comparison is made to CT of the brain dated 02/26/2021.    TECHNIQUE: CT of the head performed without intravenous contrast. The  following dose reduction techniques were utilized: automated exposure  control and/or adjustment of the mA and/or KV according to patient size,  and the use of an iterative reconstruction technique.    FINDINGS:  The ventricles and sulci are enlarged, consistent with generalized  parenchymal volume loss. The ventricles are symmetric and the basal  cisterns are patent. Moderate confluent periventricular, deep and  subcortical white matter hypoattenuation is nonspecific, but most likely  reflects the sequela of chronic small vessel ischemic change. No  intracranial hemorrhage, extra-axial fluid collection, midline shift or  mass-effect is evident. No CT evidence of acute large vessel or  territorial ischemia. Vascular calcifications indicate intracranial  atherosclerosis.    The visualized paranasal sinuses and mastoid air cells are clear.      Impression         1. No CT evidence of acute intracranial hemorrhage, herniation or  hydrocephalus.       2. Cerebral volume loss and sequela of chronic ischemic disease.    Neldon Mc, MD   07/16/2021 8:29 PM       Lynann Bologna, MD  Neurology, Neurophysiology  [facility]

## 2021-07-18 NOTE — Plan of Care (Signed)
Problem: Safety  Goal: Patient will be free from infection during hospitalization  Flowsheets (Taken 07/18/2021 0007 by Burman Riis, RN)  Free from Infection during hospitalization:   Assess and monitor for signs and symptoms of infection   Monitor lab/diagnostic results   Monitor all insertion sites (i.e. indwelling lines, tubes, urinary catheters, and drains)   Encourage patient and family to use good hand hygiene technique

## 2021-07-18 NOTE — Discharge Summary (Signed)
[facility]      Patient: Maureen Vasquez  Admission Date: 07/16/2021   DOB: 29-Jun-1934  Discharge Date: 07/18/2021    MRN: [id number]  Discharge Attending: Drue Dun, MD   Referring Physician: Armandina Stammer, MD  PCP: Armandina Stammer, MD       DISCHARGE SUMMARY     Discharge Information     Discharge Diagnosis:   Diplopia or blurry vision  UTI  Generalized weakness  Headache  Cataract        Admission Condition: fair  Discharge Condition: fair  Functional Status: Patient is independent with mobility/ambulation, transfers, ADL's, IADL's.  Discharge Disposition: home    Discharge Medications:     Medication List        CONTINUE taking these medications      docusate sodium 100 MG capsule  Commonly known as: COLACE     ergocalciferol 1.25 MG (50000 UT) capsule  Commonly known as: ERGOCALCIFEROL  Take 1 capsule (50,000 Units total) by mouth once a week     MAGNESIUM PO     TYLENOL PO     vitamin B-12 1000 MCG tablet  Commonly known as: CYANOCOBALAMIN               Patient Lines/Drains/Airways Status       Active PICC Line / CVC Line / PIV Line / Drain / Airway / Intraosseous Line / Epidural Line / ART Line / Line / Wound / Pressure Ulcer / NG/OG Tube       Name Placement date Placement time Site Days    Peripheral IV 07/16/21 20 G Left Antecubital 07/16/21  1841  Antecubital  1                       Hospital Course   Presentation History   87 year old female with history of cataract, GERD, non-Hodgkin's lymphoma status postchemotherapy, shingles infection in January who presented with headache, nausea and diplopia and feeling unwell.  See HPI for details.    Hospital Course (0 Days)   Work-up showed abnormal UA consistent with UTI, urine culture was contaminated with skin flora and showed group B strep.  Patient was on IV Rocephin  She did not have any focal findings on neurological exam.  Her vision is blurry but she is able to read the banner on the TV  MRI of the brain was done and did not show any  acute abnormalities, discussed case with Dr. Chales Abrahams who recommended ESR and CRP, both of which were normal ruling out temporal arteritis, myasthenia gravis panel is pending.  Instructed patient and husband to have outpatient follow-up on the results with Dr. Chales Abrahams.  Will treat with 2 more days of Ceftin, patient received IV Rocephin in the hospital.  Patient is very eager to go home, she does have some headache, I prescribed her Fioricet to be used.  No signs to suggest CNS infection.      Procedures/Imaging:   MRI Brain W WO Contrast   Final Result      1.  There is moderately prominent supratentorial leukomalacia that may   reflect chronic small vessel ischemic change versus the sequela of   chronic demyelinating disease.   2. No acute or recent infarct is detected.   3. No mass or hemorrhage is detected.   4. There is a normal pattern of contrast enhancement.      Theodoro Doing, MD    07/17/2021 2:02 PM  CT Head without Contrast   Final Result          1. No CT evidence of acute intracranial hemorrhage, herniation or   hydrocephalus.         2. Cerebral volume loss and sequela of chronic ischemic disease.      Neldon Mc, MD    07/16/2021 8:29 PM      Chest AP Portable   Final Result         Hyperinflation. Slight progression of peripheral interstitial opacities   more pronounced at the lung bases, which may reflect chronic   interstitial lung disease.      Georgiana Spinner, MD    07/16/2021 6:56 PM          Treatment Team:   Attending Provider: Drue Dun, MD               Progress Note/Physical Exam at Discharge     Subjective: Patient complains of mild to moderate headache, no chest pain or shortness of breath.  No focal weakness or numbness.  No diarrhea  Vitals:    07/18/21 0334 07/18/21 0453 07/18/21 0746 07/18/21 1106   BP: 172/87 160/79 151/89 137/74   Pulse: (!) 107 85 (!) 108 (!) 116   Resp: 15  16 16    Temp: 97.9 F (36.6 C)  97.9 F (36.6 C)    TempSrc: Oral  Oral    SpO2: 97%  97% 97%   Weight:        Height:           General: NAD, AAOx3  HEENT: perrla, eomi, sclera anicteric, OP: Clear, MMM.  No tenderness on palpation of the temporal areas  Neck: supple, FROM, no LAD  Cardiovascular: RRR, no m/r/g  Lungs: CTAB, no w/r/r  Abdomen: soft, +BS, NT/ND, no masses, no g/r  Extremities: no C/C/E  Skin: no rashes or lesions noted  Neuro: CN 2-12 intact; No Focal neurological deficits           Diagnostics     Labs/Studies Pending at Discharge: Yes    Last Labs   Recent Labs   Lab 07/18/21  0553 07/17/21  0616 07/16/21  1839   WBC 7.63 7.50 6.94   RBC 4.60 4.35 4.70   Hgb 14.3 13.2 14.9*   Hematocrit 40.4 38.1 41.0   MCV 87.8 87.6 87.2   Platelets 309 315 318       Recent Labs   Lab 07/18/21  0553 07/17/21  0616 07/16/21  2118 07/16/21  1839   Sodium 134* 135* 131* 130*   Potassium 3.8 4.0 4.2 4.4   Chloride 103 102 99* 94*   CO2 22 24 24 24    BUN 8.0 9.0 9.0 11.0   Creatinine 0.6 0.6 0.6 0.7   Glucose 87 85 94 96   Calcium 9.2 9.0 9.1 9.4       Microbiology Results (last 15 days)       Procedure Component Value Units Date/Time    Urine culture [[id number]] Collected: 07/16/21 2204    Order Status: Completed Specimen: Bladder Updated: 07/18/21 1137    Narrative:      ORDER#: [id number]                                    ORDERED BY: Gearldine Shown  SOURCE: Urine  COLLECTED:  07/16/21 22:04  ANTIBIOTICS AT COLL.:                                RECEIVED :  07/16/21 22:08  Culture Urine                              FINAL       07/18/21 11:37   +  07/18/21   >100,000 CFU/ML of normal urogenital or skin microbiota,             including Group B Streptococcus, no further work.               Patient Instructions   Discharge Diet: regular diet  Discharge Activity:  activity as tolerated    Follow Up Appointment:   Follow-up Information       Armandina Stammer, MD Follow up in 1 week(s).    Specialties: Family Medicine, Geriatric Medicine  Contact information:  [address] [facility]  [address] [place] [address]  [phone]               Lynann Bologna, MD Follow up in 2 week(s).    Specialty: Neurology  Contact information:  [address]  [address] [address]  [phone]               Ophthalmology Follow up in 3 day(s).                              Time spent examining patient, discussing with patient/family regarding hospital course, chart review, reconciling medications and discharge planning: 40 minutes.    Signed,  Drue Dun, MD  1:06 PM 07/18/2021     This note was generated by the [facility] EMR system/Dragon speech recognition and may contain inherent errors or omissions not intended by the user. Grammatical errors, random word insertions, deletions, pronoun errors and incomplete sentences are occasional consequences of this technology due to software limitations. Not all errors are caught or corrected. If there are questions or concerns about the content of this note or information contained within the body of this dictation they should be addressed directly with the author for clarification.

## 2021-07-22 ENCOUNTER — Ambulatory Visit (INDEPENDENT_AMBULATORY_CARE_PROVIDER_SITE_OTHER): Payer: Medicare Other | Admitting: Family Medicine

## 2021-07-22 ENCOUNTER — Encounter (INDEPENDENT_AMBULATORY_CARE_PROVIDER_SITE_OTHER): Payer: Self-pay | Admitting: Family Medicine

## 2021-07-22 VITALS — BP 136/66 | HR 83 | Temp 97.4°F | Wt 112.8 lb

## 2021-07-22 DIAGNOSIS — H34213 Partial retinal artery occlusion, bilateral: Secondary | ICD-10-CM

## 2021-07-22 DIAGNOSIS — H532 Diplopia: Secondary | ICD-10-CM

## 2021-07-22 DIAGNOSIS — Z13220 Encounter for screening for lipoid disorders: Secondary | ICD-10-CM

## 2021-07-22 NOTE — Progress Notes (Signed)
Have you seen any specialists/other providers since your last visit with [place]?    Yes  Opthalmology    Do you agree to telemedicine visit?  No    Arm preference verified?   Yes, no preference    Health Maintenance Due   Topic Date Due    FALLS RISK ANNUAL  Never done    Statin Use  Never done    Advance Directive on File  Never done    Medicare Annual Wellness Visit  Never done    Shingrix Vaccine 50+ (1) Never done    Tetanus Ten-Year  Never done    Pneumonia Vaccine Age 109+ (1 - PCV) Never done    DEPRESSION SCREENING  Never done    COVID-19 Vaccine (4 - Booster for Pfizer series) 12/12/2020    INFLUENZA VACCINE  06/14/2021

## 2021-07-22 NOTE — Progress Notes (Addendum)
[facility] - [facility]                       Date of Exam: 07/22/2021 1:10 PM        Patient ID: Maureen Vasquez is a 87 y.o. female.  Attending Physician: Armandina Stammer, MD        Chief Complaint:    Chief Complaint   Patient presents with    Hospital Follow-up             HPI:    HPI: Maureen Vasquez with h/o NHL in remission (chemo 2013-2014), shingles (11/2020), post herpetic neuralgia, syncopal episode 12/2020 followed by unremarkable cardiac w/u is here for hospital follow up.       Inpatient 07/16/21-07/18/21: She presented with diplopia, weakness, HA x 1-2 d. MRI brain was negative for CVA. ESR, CRP wnl. Myasthenia gravis panel is pending.  She was treated for a UTI with Rocephin followed by Ceftin.  Fioricet was prescribed for HA.     Diplopia is both eyes continue. She was seen by ophthalmologist who recommended MRA/MRV brain to r/o any partial occlusion in optic blood vessels. Her vision is unchanged.               Problem List:    Patient Active Problem List   Diagnosis    Low back ache    Neutropenia    Syncope and collapse    NHL (non-Hodgkin's lymphoma)    Generalized weakness    Pneumonia    Nuclear sclerotic cataract of left eye    Cataract, nuclear sclerotic senile, left    Acute UTI             Current Meds:    Outpatient Medications Marked as Taking for the 07/22/21 encounter (Office Visit) with Armandina Stammer, MD   Medication Sig Dispense Refill    Acetaminophen (TYLENOL PO) Take by mouth as needed      ergocalciferol (ERGOCALCIFEROL) 1.25 MG (50000 UT) capsule Take 1 capsule (50,000 Units total) by mouth once a week 12 capsule 1    MAGNESIUM PO Take by mouth as needed      vitamin B-12 (CYANOCOBALAMIN) 1000 MCG tablet Take 1,000 mcg by mouth daily.            Allergies:    Allergies   Allergen Reactions    Aspirin Other (See Comments)     Oral and stomach irritation if taken daily    Codeine Nausea Only    Salicylates              Past Surgical History:    Past Surgical History:   Procedure  Laterality Date    APPENDECTOMY (OPEN)      Age of 19    EXCISION, SQUAMOUS CELL      Right hand/ 2000    EXTRACTION, CATARACT, PHACO, IOL Left 04/07/2016    Procedure: EXTRACTION, CATARACT, PHACO, IOL;  Surgeon: Ellery Plunk, MD;  Location: [facility];  Service: Ophthalmology;  Laterality: Left;  LEFT PHACO W/IOL    PLACEMENT, MEDIPORT             Family History:    Family History   Problem Relation Age of Onset    Heart attack Mother     Heart disease Mother     Parkinsonism Mother     Hyperlipidemia Mother     Heart disease Sister     Heart disease Brother  Social History:    Social History     Tobacco Use    Smoking status: Never    Smokeless tobacco: Never   Vaping Use    Vaping Use: Never used   Substance Use Topics    Alcohol use: No    Drug use: No           The following sections were reviewed this encounter by the provider:   Tobacco   Allergies   Meds   Problems   Med Hx   Surg Hx   Fam Hx              Vital Signs:    BP 136/66 (BP Site: Left arm, Patient Position: Sitting, Cuff Size: Medium)    Pulse 83    Temp 97.4 F (36.3 C) (Temporal)    Wt 51.2 kg (112 lb 12.8 oz)    SpO2 96%    BMI 25.29 kg/m          ROS:    Review of Systems   Constitutional:  Positive for fatigue. Negative for fever.   Eyes:  Negative for pain, discharge, redness and itching.        Diplopia. Vision is at baseline.   Respiratory:  Negative for cough and shortness of breath.    Cardiovascular:  Negative for chest pain and palpitations.   Gastrointestinal:  Negative for nausea and vomiting.   Neurological:  Negative for dizziness.            Physical Exam:    Physical Exam  Vitals reviewed.   Constitutional:       Appearance: Normal appearance.   Eyes:      Extraocular Movements: Extraocular movements intact.      Conjunctiva/sclera: Conjunctivae normal.      Pupils: Pupils are equal, round, and reactive to light.      Comments: Intermittent extropia on either eye. Also has eyelid fatigue on both upper  eyelids   Cardiovascular:      Rate and Rhythm: Normal rate and regular rhythm.      Heart sounds: Normal heart sounds.   Pulmonary:      Effort: Pulmonary effort is normal.      Breath sounds: Normal breath sounds.   Lymphadenopathy:      Cervical: No cervical adenopathy.   Neurological:      General: No focal deficit present.      Mental Status: She is alert and oriented to person, place, and time.            Assessment:    1. Diplopia    2. Partial retinal artery occlusion, bilateral  - MRA Head W WO Contrast; Future  - MR Venogram Head W WO Contrast; Future    3. Screening, lipid  - Lipid panel; Future          Plan:    Myasthenia gravis antibody panel pending.   MRA/MRV brain ordered.  Check FLP.  F/u with neurology as referred by ER.         Follow-up:     As needed        Armandina Stammer, MD

## 2021-07-25 ENCOUNTER — Ambulatory Visit: Payer: Medicare Other

## 2021-07-26 ENCOUNTER — Telehealth (INDEPENDENT_AMBULATORY_CARE_PROVIDER_SITE_OTHER): Payer: Self-pay | Admitting: Family Medicine

## 2021-07-26 NOTE — Telephone Encounter (Signed)
Patient called states her insurance will not cover the MRA the procedure code needs to be change in order for insurance to cover.   Cb# [phone]

## 2021-07-26 NOTE — Addendum Note (Signed)
Addended by: Armandina Stammer on: 07/26/2021 03:05 PM     Modules accepted: Orders

## 2021-07-27 ENCOUNTER — Telehealth (INDEPENDENT_AMBULATORY_CARE_PROVIDER_SITE_OTHER): Payer: Self-pay | Admitting: Family Medicine

## 2021-07-27 ENCOUNTER — Ambulatory Visit
Admission: RE | Admit: 2021-07-27 | Discharge: 2021-07-27 | Disposition: A | Discharge status: Home / Self Care | Payer: Medicare Other | Source: Ambulatory Visit | Attending: Family Medicine | Admitting: Family Medicine

## 2021-07-27 DIAGNOSIS — H34213 Partial retinal artery occlusion, bilateral: Secondary | ICD-10-CM | POA: Insufficient documentation

## 2021-07-27 DIAGNOSIS — I6621 Occlusion and stenosis of right posterior cerebral artery: Secondary | ICD-10-CM | POA: Insufficient documentation

## 2021-07-27 DIAGNOSIS — R9089 Other abnormal findings on diagnostic imaging of central nervous system: Secondary | ICD-10-CM | POA: Insufficient documentation

## 2021-07-27 DIAGNOSIS — I672 Cerebral atherosclerosis: Secondary | ICD-10-CM | POA: Insufficient documentation

## 2021-07-27 MED ORDER — GADOBUTROL 1 MMOL/ML IV SOSY (WRAP)
10.0000 mL | Freq: Once | INTRAVENOUS | Status: AC | PRN
Start: 2021-07-27 — End: 2021-07-27
  Administered 2021-07-27: 16:00:00 10 mL via INTRAVENOUS
  Filled 2021-07-27: qty 10

## 2021-07-27 NOTE — Telephone Encounter (Signed)
Called stating they had gotten the MRI done and that they wanted to see if they need to make a follow up appointment or not to discuss when results are in.   [phone]

## 2021-07-28 ENCOUNTER — Telehealth (INDEPENDENT_AMBULATORY_CARE_PROVIDER_SITE_OTHER): Payer: Self-pay | Admitting: Family Medicine

## 2021-07-28 ENCOUNTER — Other Ambulatory Visit (INDEPENDENT_AMBULATORY_CARE_PROVIDER_SITE_OTHER): Payer: Self-pay | Admitting: Family Medicine

## 2021-07-28 DIAGNOSIS — H532 Diplopia: Secondary | ICD-10-CM

## 2021-07-28 NOTE — Telephone Encounter (Signed)
Pt husband called about blood test that she had gotten in the hospital. Pt's husband discovered the test had been cancelled and wanted to know if you thought it was needed to get. If so is it something that can be done here. Ph. [phone]  Myasthenia Gravis Panel w/Reflex to [facility]

## 2021-07-29 ENCOUNTER — Other Ambulatory Visit (INDEPENDENT_AMBULATORY_CARE_PROVIDER_SITE_OTHER): Payer: Self-pay | Admitting: Family Medicine

## 2021-07-29 ENCOUNTER — Encounter (INDEPENDENT_AMBULATORY_CARE_PROVIDER_SITE_OTHER): Payer: Self-pay

## 2021-07-29 DIAGNOSIS — H532 Diplopia: Secondary | ICD-10-CM

## 2021-07-30 ENCOUNTER — Other Ambulatory Visit (INDEPENDENT_AMBULATORY_CARE_PROVIDER_SITE_OTHER): Payer: Self-pay | Admitting: Family Medicine

## 2021-07-30 DIAGNOSIS — L570 Actinic keratosis: Secondary | ICD-10-CM

## 2021-08-02 ENCOUNTER — Telehealth (INDEPENDENT_AMBULATORY_CARE_PROVIDER_SITE_OTHER): Payer: Self-pay | Admitting: Family Medicine

## 2021-08-02 NOTE — Telephone Encounter (Signed)
Called Dr. Urban Gibson office and was told that instead of neurology she needs to see a neuro ophthalmologist. They tried to call Dr. Sharyne Richters office and can't get in until October 17th. Wants to know if there is a way to get her a sooner appt.  Also would like to pick up a copy of the report from the MRV/MRA when they come to the office tomorrow.     [phone]

## 2021-08-03 ENCOUNTER — Other Ambulatory Visit (FREE_STANDING_LABORATORY_FACILITY): Payer: Medicare Other

## 2021-08-03 DIAGNOSIS — Z13220 Encounter for screening for lipoid disorders: Secondary | ICD-10-CM

## 2021-08-03 LAB — LIPID PANEL
Cholesterol / HDL Ratio: 4.5 Index
Cholesterol: 282 mg/dL — ABNORMAL HIGH (ref 0–199)
HDL: 63 mg/dL (ref 40–9999)
LDL Calculated: 195 mg/dL — ABNORMAL HIGH (ref 0–99)
Triglycerides: 119 mg/dL (ref 34–149)
VLDL Calculated: 24 mg/dL (ref 10–40)

## 2021-08-04 ENCOUNTER — Other Ambulatory Visit (INDEPENDENT_AMBULATORY_CARE_PROVIDER_SITE_OTHER): Payer: Self-pay | Admitting: Family Medicine

## 2021-08-04 MED ORDER — ATORVASTATIN CALCIUM 10 MG PO TABS
10.0000 mg | ORAL_TABLET | Freq: Every evening | ORAL | 1 refills | Status: DC
Start: 2021-08-04 — End: 2021-09-28

## 2021-08-10 ENCOUNTER — Encounter (INDEPENDENT_AMBULATORY_CARE_PROVIDER_SITE_OTHER): Payer: Self-pay

## 2021-09-27 ENCOUNTER — Other Ambulatory Visit (INDEPENDENT_AMBULATORY_CARE_PROVIDER_SITE_OTHER): Payer: Self-pay | Admitting: Family Medicine

## 2021-09-28 ENCOUNTER — Other Ambulatory Visit (INDEPENDENT_AMBULATORY_CARE_PROVIDER_SITE_OTHER): Payer: Self-pay | Admitting: Family Medicine

## 2021-09-28 DIAGNOSIS — E785 Hyperlipidemia, unspecified: Secondary | ICD-10-CM

## 2021-10-11 ENCOUNTER — Encounter (HOSPITAL_BASED_OUTPATIENT_CLINIC_OR_DEPARTMENT_OTHER): Payer: Self-pay

## 2021-10-15 ENCOUNTER — Encounter (INDEPENDENT_AMBULATORY_CARE_PROVIDER_SITE_OTHER): Payer: Self-pay | Admitting: Family Medicine

## 2021-11-04 ENCOUNTER — Other Ambulatory Visit (INDEPENDENT_AMBULATORY_CARE_PROVIDER_SITE_OTHER): Payer: Self-pay | Admitting: Family Medicine

## 2021-11-04 NOTE — Telephone Encounter (Signed)
Contact patient.  She is due for her cholesterol level.  This will be the last refill she will get for her cholesterol medicine unless she has the test done.

## 2021-11-09 ENCOUNTER — Telehealth (INDEPENDENT_AMBULATORY_CARE_PROVIDER_SITE_OTHER): Payer: Self-pay | Admitting: Family Medicine

## 2021-11-26 ENCOUNTER — Other Ambulatory Visit (INDEPENDENT_AMBULATORY_CARE_PROVIDER_SITE_OTHER): Payer: Self-pay

## 2021-11-26 ENCOUNTER — Telehealth (INDEPENDENT_AMBULATORY_CARE_PROVIDER_SITE_OTHER): Payer: Self-pay | Admitting: Family Medicine

## 2021-11-26 NOTE — Telephone Encounter (Signed)
Last office visit: 07/22/2021    Last refill : 11/04/2021    Last labs: 08/03/2021 with pending Lipid     Appointment status: 12/2021

## 2021-11-26 NOTE — Telephone Encounter (Signed)
Pt states she bought the OTC Vitamin D3 maximum strength, request to know if it ok to take. Please advise       Name, strength, directions of requested refill(s):  atorvastatin (LIPITOR) 10 MG tablet    Pharmacy to send refill to or patient to pick up rx from office (mark requested pharmacy in BOLD):      [id number], [place] - [address] [facility] [address]  [address] [place] [address]  Phone: [phone] Fax: [phone]        Please mark "X" next to the preferred call back number:    Mobile:   Telephone Information:   Mobile [phone]       Home: @HOMEPHONE @    Work: @WORKPHONE @        Medication refill request , see above. Thank you     Additional Notes:     Next Visit:

## 2021-11-27 MED ORDER — ATORVASTATIN CALCIUM 10 MG PO TABS
10.0000 mg | ORAL_TABLET | Freq: Every evening | ORAL | 0 refills | Status: DC
Start: 2021-11-27 — End: 2021-12-27

## 2021-12-13 ENCOUNTER — Ambulatory Visit (INDEPENDENT_AMBULATORY_CARE_PROVIDER_SITE_OTHER): Payer: Medicare Other | Admitting: Family Medicine

## 2021-12-15 ENCOUNTER — Ambulatory Visit (INDEPENDENT_AMBULATORY_CARE_PROVIDER_SITE_OTHER): Payer: Medicare Other | Admitting: Family Medicine

## 2021-12-15 ENCOUNTER — Encounter (INDEPENDENT_AMBULATORY_CARE_PROVIDER_SITE_OTHER): Payer: Self-pay | Admitting: Family Medicine

## 2021-12-15 VITALS — BP 144/70 | HR 87 | Temp 96.8°F | Wt 121.0 lb

## 2021-12-15 DIAGNOSIS — E785 Hyperlipidemia, unspecified: Secondary | ICD-10-CM

## 2021-12-15 DIAGNOSIS — Z0000 Encounter for general adult medical examination without abnormal findings: Secondary | ICD-10-CM

## 2021-12-15 LAB — HEPATIC FUNCTION PANEL
ALT: 9 U/L (ref 0–55)
AST (SGOT): 23 U/L (ref 5–41)
Albumin/Globulin Ratio: 1.6 (ref 0.9–2.2)
Albumin: 4.1 g/dL (ref 3.5–5.0)
Alkaline Phosphatase: 88 U/L (ref 37–117)
Bilirubin Direct: 0.2 mg/dL (ref 0.0–0.5)
Bilirubin Indirect: 0.3 mg/dL (ref 0.2–1.0)
Bilirubin, Total: 0.5 mg/dL (ref 0.2–1.2)
Globulin: 2.5 g/dL (ref 2.0–3.6)
Protein, Total: 6.6 g/dL (ref 6.0–8.3)

## 2021-12-15 LAB — LIPID PANEL
Cholesterol / HDL Ratio: 3 Index
Cholesterol: 196 mg/dL (ref 0–199)
HDL: 66 mg/dL (ref 40–9999)
LDL Calculated: 114 mg/dL — ABNORMAL HIGH (ref 0–99)
Triglycerides: 80 mg/dL (ref 34–149)
VLDL Calculated: 16 mg/dL (ref 10–40)

## 2021-12-15 LAB — HEMOLYSIS INDEX: Hemolysis Index: 7 Index (ref 0–24)

## 2021-12-15 NOTE — Progress Notes (Signed)
Have you seen any specialists since your last visit with [place]?  Yes      The patient was informed that the following HM items are still outstanding:   Md will assess

## 2021-12-15 NOTE — Progress Notes (Signed)
Maureen Vasquez is a 88 y.o. female who presents today for the following Medicare Wellness Visit:  []  Initial Preventive Physical Exam (IPPE) - "Welcome to Medicare" preventive visit (Vision Screening required)   []  Annual Wellness Visit - Initial  [x]  Annual Wellness Visit - Subsequent     Health Risk Assessment:   During the past month, how would you rate your general health?:  Fair  Which of the following tasks can you do without assistance - drive or take the bus alone; shop for groceries or clothes; prepare your own meals; do your own housework/laundry; handle your own finances/pay bills; eat, bathe or get around your home?: Drive or take the bus alone, Prepare your own meals, Eat, bathe, dress or get around your home, Do your own housework/laundry, Shop for groceries or clothes, Handle your own finances/pay bills  Which of the following problems have you been bothered by in the past month - dizzy when standing up; problems using the phone; feeling tired or fatigued; moderate or severe body pain?: Feeling tired or fatigued  Do you exercise for about 20 minutes 3 or more days per week?:Yes  During the past month was someone available to help if you needed and wanted help?  For example, if you felt nervous, lonely, got sick and had to stay in bed, needed someone to talk to, needed help with daily chores or needed help just taking care of yourself.: Yes  Do you always wear a seat belt?: Yes  Do you have any trouble taking medications the way you have been told to take them?: No  Have you been given any information that can help you with keeping track of your medications?: Yes  Do you have trouble paying for your medications?: No  Have you been given any information that can help you with hazards in your house, such as scatter rugs, furniture, etc?: No  Do you feel unsteady when standing or walking?: Yes  Do you worry about falling?: No  Have you fallen two or more  times in the past year?: No  Did you suffer any injuries from your falls in the past year?: No     Care Team:   Patient Care Team:  Armandina Stammer, MD as PCP - General (Family Medicine)  Mack Hook, MD  Craig Staggers, MD as Consulting Physician (Cardiology)      Hospitalizations:   Hospitalization within past year: [x]  No  []  Yes     Diagnosis:      Screenings:   Ambulatory Screenings 12/15/2021 12/15/2021 12/15/2021   Falls Risk: [place] more than 2 times in past year N N -   Falls Risk: Suffer any injuries? N N -   Depression: PHQ2 Total Score - - 0   Depression: PHQ9 Total Score - - 0        Substance Use Disorder Screen:  In the past year, how often have you used the following?  1) Alcohol (For men, 5 or more drinks a day. For women, 4 or more drinks a day)  [x]  Never []  Once or Twice []  Monthly []  Weekly []  Daily or Almost Daily  2) Tobacco Products  [x]  Never []  Once or Twice []  Monthly []  Weekly []  Daily or Almost Daily  3) Prescription Drugs for Non-Medical Reasons  [x]  Never []  Once or Twice []  Monthly []  Weekly []  Daily or Almost Daily  4) Illegal Drugs  [x]  Never []  Once or Twice []  Monthly []  Weekly []  Daily or Almost Daily             Functional Ability/Level of Safety:   Falls Risk/Home Safety Assessment:  ( see HRA and Screenings sections for additional assessment)  Home Safety: []  Stair handrails  [x]  Skid-resistant rugs/remove throw rugs   []  Grab bars  []  Clear pathways between rooms  [x]  Proper lighting stairs/ bathrooms/bedrooms  Get Up and Go (optional):  [x]   <20 secs  []   >20 secs    []   High risk for falls - Home Safety/Falls Risk Precautions reviewed with pt/family    Hearing Assessment:  Concerns for hearing loss: [x]  Yes  []   No  Hearing aids:   []   Right  []   Left  []   Bilateral   [x]   None  Whisper Test (optional):  []  Normal  []   Slightly decreased  []   Significantly decreased    Exercise:  Frequency:  []   No formal exercise  []   1-2x/wk  [x]   3-4x/wk  []   >4x/wk  Duration:  [x]   15-30  mins/day  []   30-45 mins/day  []   45+ mins/day  Intensity:  [x]   Light  []   Moderate  []   Heavy        Activities of Daily Living:   ADL's Independent Minimal  Assistance Moderate  Assistance Total   Assistance   Bathing [x]  []  []  []    Dressing [x]  []  []  []    Mobility   [x]  []  []  []    Transfer [x]  []  []  []    Eating [x]  []  []  []    Toileting [x]  []  []  []      IADL's Independent Minimal  Assistance Moderate  Assistance Total   Assistance   Phone [x]  []  []  []    Housekeeping [x]  []  []  []    Laundry [x]  []  []  []    Transportation [x]  []  []  []    Medications [x]  []  []  []    Finances [x]  []  []  []       ADL assistance: [x]  No assistance needed  []  Spouse  []  Sibling  []  Son   []  Daughter []  Children  []  Home Health Aide []  Other:       Advance Care Planning:   Discussion of Advance Directives:   []  Advance Directive in chart  []  Advance Directive not in chart - requested to provide [x]  No Advance Directive.  Form Provided  []  No Advance Directive.  Pt declines. []  Not addressed today  []  Other:     Exam:   BP 144/70   Pulse 87   Temp (!) 96.8 F (36 C) (Temporal)   Wt 54.9 kg (121 lb)   SpO2 97%   BMI 27.13 kg/m      Physical Exam  Vitals reviewed.   Constitutional:       Appearance: Normal appearance.   Cardiovascular:      Rate and Rhythm: Normal rate and regular rhythm.      Heart sounds: Normal heart sounds.   Pulmonary:      Effort: Pulmonary effort is normal.      Breath sounds: Normal breath sounds.   Neurological:      Mental Status: She is alert.           Evaluation of Cognitive Function:   Mood/affect: [x]  Appropriate  []   Other:   Appearance: [x]  Neatly groomed  [x]  Adequately nourished  []  Other:  Family member/caregiver input: []   Present - no concerns  []   Not present in room  []  Present - concerns:    Cognitive Assessment:  Mini-Cog Result (three word registration- banana, sunrise, chair / clock drawing):   [x]   > 3 points - negative screen for dementia   []  3 recalled words - negative screen for dementia    []  1-2 recalled words and normal clock draw - negative for cognitive impairment   []  1-2 recalled words and abnormal clock draw - positive for cognitive impairment   []  0 recalled words - positive for cognitive impairment         Assessment/Plan:   1. Medicare annual wellness visit, subsequent: Doing well. Independent with ADL/IADL. Advised to get Tdap from pharmacy. She will check her records to see if up to date on pneumonia vaccines. Form provided for advance directives. Fall precautions discussed.     2. Hyperlipidemia, unspecified hyperlipidemia type: She was started on Lipitor 07/2021 (LDL 195). Recheck labs today. She is not fasting and had some fruit 3 hours ago.          Armandina StammerFaiqa Lilyanne Mcquown, MD    12/15/2021     The following sections were reviewed this encounter by the provider:   Tobacco  Allergies  Meds  Problems  Med Hx  Surg Hx  Fam Hx          History:   Patient Active Problem List   Diagnosis    Low back ache    Neutropenia    Syncope and collapse    NHL (non-Hodgkin's lymphoma)    Generalized weakness    Pneumonia    Nuclear sclerotic cataract of left eye    Cataract, nuclear sclerotic senile, left    Acute UTI      Past Medical History:   Diagnosis Date    Arthritis     Rt hand index finger    Back pain     Ear, nose and throat disorder     sinus issues    Encounter for blood transfusion 2015    Gastroesophageal reflux disease     H/O acute pancreatitis 1990's    Low back pain 2014    3 slipped discs    Lymphona, mantle cell, inguinal region/lower limb     Malignant neoplasm 2014    Non Hodgkins lymphoma/s/p chemo    NHL (non-Hodgkin's lymphoma) October 2013    w/ Splenic Nodules    Post-operative nausea and vomiting     Cardiac arrest 34 yrs ago with ruptured appendix/also in 2000 w/endoscopy    Seasonal allergic rhinitis      Past Surgical History:   Procedure Laterality Date    APPENDECTOMY (OPEN)      Age of 88    EXCISION, SQUAMOUS CELL      Right hand/ 2000    EXTRACTION, CATARACT, PHACO, IOL  Left 04/07/2016    Procedure: EXTRACTION, CATARACT, PHACO, IOL;  Surgeon: Ellery PlunkPark, Juliana Y, MD;  [facility];  Service: Ophthalmology;  Laterality: Left;  LEFT PHACO W/IOL    PLACEMENT, MEDIPORT       Allergies   Allergen Reactions    Aspirin Other (See Comments)     Oral and stomach irritation if taken daily    Codeine Nausea Only    Salicylates       Outpatient Medications Marked as Taking for the 12/15/21 encounter (Office Visit) with Armandina StammerMahmud, Aliviah Spain, MD   Medication Sig Dispense Refill    Acetaminophen (TYLENOL PO) Take  by mouth as needed      atorvastatin (LIPITOR) 10 MG tablet Take 1 tablet (10 mg) by mouth nightly 30 tablet 0    vitamin B-12 (CYANOCOBALAMIN) 1000 MCG tablet Take 1,000 mcg by mouth daily.       Social History     Tobacco Use    Smoking status: Never    Smokeless tobacco: Never   Vaping Use    Vaping Use: Never used   Substance Use Topics    Alcohol use: No    Drug use: No      Family History   Problem Relation Age of Onset    Heart attack Mother     Heart disease Mother     Parkinsonism Mother     Hyperlipidemia Mother     Heart disease Sister     Heart disease Brother            ===================================================================    Additional Documentation:

## 2021-12-26 ENCOUNTER — Other Ambulatory Visit (INDEPENDENT_AMBULATORY_CARE_PROVIDER_SITE_OTHER): Payer: Self-pay | Admitting: Family Medicine

## 2022-01-20 ENCOUNTER — Other Ambulatory Visit (INDEPENDENT_AMBULATORY_CARE_PROVIDER_SITE_OTHER): Payer: Self-pay | Admitting: Family Medicine

## 2022-02-18 ENCOUNTER — Other Ambulatory Visit (INDEPENDENT_AMBULATORY_CARE_PROVIDER_SITE_OTHER): Payer: Self-pay | Admitting: Family Medicine

## 2022-08-29 ENCOUNTER — Other Ambulatory Visit (INDEPENDENT_AMBULATORY_CARE_PROVIDER_SITE_OTHER): Payer: Self-pay | Admitting: Family Medicine

## 2022-12-21 ENCOUNTER — Other Ambulatory Visit (INDEPENDENT_AMBULATORY_CARE_PROVIDER_SITE_OTHER): Payer: Self-pay | Admitting: Critical Care Medicine

## 2022-12-27 ENCOUNTER — Ambulatory Visit (INDEPENDENT_AMBULATORY_CARE_PROVIDER_SITE_OTHER): Payer: Medicare Other | Admitting: Family Medicine

## 2022-12-28 ENCOUNTER — Ambulatory Visit (INDEPENDENT_AMBULATORY_CARE_PROVIDER_SITE_OTHER): Payer: Medicare Other | Admitting: Family Medicine

## 2022-12-28 ENCOUNTER — Encounter (INDEPENDENT_AMBULATORY_CARE_PROVIDER_SITE_OTHER): Payer: Self-pay | Admitting: Family Medicine

## 2022-12-28 VITALS — BP 134/67 | HR 96 | Temp 96.0°F | Resp 16 | Wt 120.0 lb

## 2022-12-28 DIAGNOSIS — J849 Interstitial pulmonary disease, unspecified: Secondary | ICD-10-CM

## 2022-12-28 DIAGNOSIS — Z0000 Encounter for general adult medical examination without abnormal findings: Secondary | ICD-10-CM

## 2022-12-28 DIAGNOSIS — E785 Hyperlipidemia, unspecified: Secondary | ICD-10-CM

## 2022-12-28 DIAGNOSIS — R519 Headache, unspecified: Secondary | ICD-10-CM

## 2022-12-28 DIAGNOSIS — E538 Deficiency of other specified B group vitamins: Secondary | ICD-10-CM

## 2022-12-28 DIAGNOSIS — E559 Vitamin D deficiency, unspecified: Secondary | ICD-10-CM

## 2022-12-28 LAB — COMPREHENSIVE METABOLIC PANEL
ALT: 7 U/L (ref 0–55)
AST (SGOT): 20 U/L (ref 5–41)
Albumin/Globulin Ratio: 1.6 (ref 0.9–2.2)
Albumin: 4.2 g/dL (ref 3.5–5.0)
Alkaline Phosphatase: 80 U/L (ref 37–117)
Anion Gap: 8 (ref 5.0–15.0)
BUN: 16 mg/dL (ref 7.0–21.0)
Bilirubin, Total: 0.7 mg/dL (ref 0.2–1.2)
CO2: 25 mEq/L (ref 17–29)
Calcium: 9.5 mg/dL (ref 7.9–10.2)
Chloride: 102 mEq/L (ref 99–111)
Creatinine: 0.7 mg/dL (ref 0.4–1.0)
Globulin: 2.6 g/dL (ref 2.0–3.6)
Glucose: 88 mg/dL (ref 70–100)
Potassium: 4.3 mEq/L (ref 3.5–5.3)
Protein, Total: 6.8 g/dL (ref 6.0–8.3)
Sodium: 135 mEq/L (ref 135–145)
eGFR: >60 mL/min/{1.73_m2} (ref >=60)

## 2022-12-28 LAB — CBC AND DIFFERENTIAL
Absolute NRBC: 0 10*3/uL (ref 0.00–0.00)
Basophils Absolute Automated: 0.06 10*3/uL (ref 0.00–0.08)
Basophils Automated: 0.8 %
Eosinophils Absolute Automated: 0.19 10*3/uL (ref 0.00–0.44)
Eosinophils Automated: 2.6 %
Hematocrit: 41.3 % (ref 34.7–43.7)
Hgb: 13.6 g/dL (ref 11.4–14.8)
Immature Granulocytes Absolute: 0.04 10*3/uL (ref 0.00–0.07)
Immature Granulocytes: 0.5 %
Instrument Absolute Neutrophil Count: 4.33 10*3/uL (ref 1.10–6.33)
Lymphocytes Absolute Automated: 1.52 10*3/uL (ref 0.42–3.22)
Lymphocytes Automated: 20.9 %
MCH: 30.1 pg (ref 25.1–33.5)
MCHC: 32.9 g/dL (ref 31.5–35.8)
MCV: 91.4 fL (ref 78.0–96.0)
MPV: 11.6 fL (ref 8.9–12.5)
Monocytes Absolute Automated: 1.14 10*3/uL — ABNORMAL HIGH (ref 0.21–0.85)
Monocytes: 15.7 %
Neutrophils Absolute: 4.33 10*3/uL (ref 1.10–6.33)
Neutrophils: 59.5 %
Nucleated RBC: 0 /100 WBC (ref 0.0–0.0)
Platelets: 298 10*3/uL (ref 142–346)
RBC: 4.52 10*6/uL (ref 3.90–5.10)
RDW: 13 % (ref 11–15)
WBC: 7.28 10*3/uL (ref 3.10–9.50)

## 2022-12-28 LAB — HEMOLYSIS INDEX: Hemolysis Index: 7 Index (ref 0–24)

## 2022-12-28 LAB — VITAMIN B12: Vitamin B-12: 942 pg/mL — ABNORMAL HIGH (ref 211–911)

## 2022-12-28 LAB — LIPID PANEL
Cholesterol / HDL Ratio: 3 Index
Cholesterol: 190 mg/dL (ref 0–199)
HDL: 63 mg/dL (ref 40–9999)
LDL Calculated: 114 mg/dL — ABNORMAL HIGH (ref 0–99)
Triglycerides: 66 mg/dL (ref 34–149)
VLDL Calculated: 13 mg/dL (ref 10–40)

## 2022-12-28 LAB — VITAMIN D,25 OH,TOTAL: Vitamin D, 25 OH, Total: 29 ng/mL — ABNORMAL LOW (ref 30–100)

## 2022-12-28 LAB — TSH: TSH: 2.77 u[IU]/mL (ref 0.35–4.94)

## 2022-12-28 NOTE — Progress Notes (Signed)
[place] - Maureen Vasquez is a 89 y.o. female who presents today for the following Medicare Wellness Visit:  []$  Initial Preventive Physical Exam (IPPE) - "Welcome to Medicare" preventive visit (Vision Screening required)   []$  Annual Wellness Visit - Initial  [x]$  Annual Wellness Visit - Subsequent       Health Risk Assessment:   During the past month, how would you rate your general health?:  Poor  Which of the following tasks can you do without assistance - drive or take the bus alone; shop for groceries or clothes; prepare your own meals; do your own housework/laundry; handle your own finances/pay bills; eat, bathe or get around your home?: Drive or take the bus alone, Do your own housework/laundry, Shop for groceries or clothes, Prepare your own meals, Eat, bathe, dress or get around your home  Which of the following problems have you been bothered by in the past month - dizzy when standing up; problems using the phone; feeling tired or fatigued; moderate or severe body pain?: Feeling tired or fatigued, Moderate or severe body pain Body aches due to lower back, knee and hip OA.    Do you exercise for about 20 minutes 3 or more days per week?:No  During the past month was someone available to help if you needed and wanted help?  For example, if you felt nervous, lonely, got sick and had to stay in bed, needed someone to talk to, needed help with daily chores or needed help just taking care of yourself.: Yes  Do you always wear a seat belt?: Yes  Do you have any trouble taking medications the way you have been told to take them?: No  Have you been given any information that can help you with keeping track of your medications?: No  Do you have trouble paying for your medications?: No  Have you been given any information that can help you with hazards in your house, such as scatter rugs, furniture, etc?: No  Do you feel unsteady when standing or walking?: Yes  Do you worry about falling?:  Yes  Have you fallen two or more times in the past year?: No  Did you suffer any injuries from your falls in the past year?: No     Care Team:   Patient Care Team:  Towana Badger, MD as PCP - General (Family Medicine)  Lindwood Coke, MD  Gala Lewandowsky, MD as Consulting Physician (Cardiology)      Hospitalizations:   Hospitalization within past year: [x]$  No  []$  Yes     Diagnosis:      Screenings:       12/15/2021     2:07 PM 12/28/2022     8:50 AM 12/28/2022     8:54 AM   Ambulatory Screenings   Falls Risk: Jerelyn Scott more than 2 times in past year  N    Falls Risk: Suffer any injuries?  N    Depression: PHQ2 Total Score 0  0   Depression: PHQ9 Total Score 0  0        Substance Use Disorder Screen:  In the past year, how often have you used the following?  1) Alcohol (For men, 5 or more drinks a day. For women, 4 or more drinks a day)  [x]$  Never []$  Once or Twice []$  Monthly []$  Weekly []$  Daily or Almost Daily  2) Tobacco Products  [x]$  Never []$  Once or Twice []$  Monthly []$   Weekly []$  Daily or Almost Daily  3) Prescription Drugs for Non-Medical Reasons  [x]$  Never []$  Once or Twice []$  Monthly []$  Weekly []$  Daily or Almost Daily  4) Illegal Drugs  [x]$  Never []$  Once or Twice []$  Monthly []$  Weekly []$  Daily or Almost Daily             Functional Ability/Level of Safety:   Falls Risk/Home Safety Assessment:  ( see HRA and Screenings sections for additional assessment)  Home Safety: []$  Stair handrails  [x]$  Skid-resistant rugs/remove throw rugs   []$  Grab bars  [x]$  Clear pathways between rooms  [x]$  Proper lighting stairs/ bathrooms/bedrooms  Get Up and Go (optional):  []$   <20 secs  []$   >20 secs    []$   High risk for falls - Home Safety/Falls Risk Precautions reviewed with pt/family    Hearing Assessment:  Concerns for hearing loss: [x]$  Yes  []$   No  Hearing aids:   []$   Right  []$   Left  []$   Bilateral   [x]$   None  Whisper Test (optional):  []$  Normal  []$   Slightly decreased  []$   Significantly decreased    Exercise:  Frequency:  [x]$   No formal  exercise  []$   1-2x/wk  []$   3-4x/wk  []$   >4x/wk  Duration:  []$   15-30 mins/day  []$   30-45 mins/day  []$   45+ mins/day  Intensity:  []$   Light  []$   Moderate  []$   Heavy        Activities of Daily Living:   ADL's Independent Minimal  Assistance Moderate  Assistance Total   Assistance   Bathing [x]$  []$  []$  []$    Dressing [x]$  []$  []$  []$    Mobility   [x]$  []$  []$  []$    Transfer [x]$  []$  []$  []$    Eating [x]$  []$  []$  []$    Toileting [x]$  []$  []$  []$      IADL's Independent Minimal  Assistance Moderate  Assistance Total   Assistance   Phone [x]$  []$  []$  []$    Housekeeping [x]$  []$  []$  []$    Laundry [x]$  []$  []$  []$    Transportation []$  []$  []$  [x]$    Medications [x]$  []$  []$  []$    Finances []$  []$  [x]$  []$       ADL assistance: [x]$  No assistance needed  [x]$  Spouse  []$  Sibling  []$  Son   []$  Daughter []$  Children  []$  Home Health Aide []$  Other:       Advance Care Planning:   Discussion of Advance Directives:   []$  Advance Directive in chart  [x]$  Advance Directive not in chart - requested to provide []$  No Advance Directive.  Form Provided  []$  No Advance Directive.  Pt declines. []$  Not addressed today  []$  Other:     Exam:   BP 134/67   Pulse 96   Temp (!) 96 F (35.6 C)   Resp 16   Wt 54.4 kg (120 lb)   BMI 26.90 kg/m      Physical Exam  Vitals reviewed.   Constitutional:       Appearance: Normal appearance.   Cardiovascular:      Rate and Rhythm: Normal rate and regular rhythm.      Heart sounds: Normal heart sounds.   Pulmonary:      Comments: Faint bibasilar crackles.   Musculoskeletal:      Comments: No temporal tenderness. No tenderness over C-spine. Full ROM at neck.    Neurological:      Mental Status: She is alert.  Evaluation of Cognitive Function:   Mood/affect: [x]$  Appropriate  []$   Other:   Appearance: [x]$  Neatly groomed  [x]$  Adequately nourished  []$  Other:  Family member/caregiver input: []$  Present - no concerns  []$   Not present in room  []$  Present - concerns:    Cognitive Assessment:  Mini-Cog Result (three word registration- banana, sunrise, chair /  clock drawing):   []$   > 3 points - negative screen for dementia   [x]$  3 recalled words - negative screen for dementia   []$  1-2 recalled words and normal clock draw - negative for cognitive impairment   []$  1-2 recalled words and abnormal clock draw - positive for cognitive impairment   []$  0 recalled words - positive for cognitive impairment         Assessment/Plan:   1. Medicare annual wellness visit, subsequent: Independent in ADL and most IADL. Up to date on immunizations.     2. Hyperlipidemia, unspecified hyperlipidemia type: Well controlled. Continue statin.   - CBC and differential  - Comprehensive metabolic panel  - Lipid panel  - TSH    3. ILD (interstitial lung disease): Chronic. Stable. In follow up with pulmonology.     4. Headache, unspecified headache type: Intermittent dull, left parietal HA x 4-5 weeks. No new vision changes. No specific triggers to HA. Referred for neurology consult. 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   - Referral to Neurology ([facility]); Future    5. Vitamin D deficiency, unspecified  - Vitamin D,25 OH, Total    6. Vitamin B12 deficiency  - Vitamin B12    Education/Counseling:  During the course of the visit, the patient was educated and counseled about appropriate screening and preventive services including:   Influenza vaccine  Nutrition counseling  Physical activity/exercise counseling  Falls risk prevention and home safety      Falls Risk/Home Safety:  The following falls risk/home safety measures were discussed with the patient:  [x]$  Home Environment - 1) clear floors, walkways, and stairs of items 2) Remove throw rugs or use non-skid rugs 3) Install grab bars in bathroom 4) Use of handrails with stairs 5) Proper lighting of hallways, bathroom, stairs, bedroom  [x]$  Physical Activity - age appropriate activity/exercise 150 mins per week.  [x]$  Nutrition - adequate hydration, limit soda, coffee, alcohol    [x]$  Vision - yearly eye exam, keep glasses clean  [x]$  Medications - side effect education        Self-Management:  Ability to Understand and Manage Health Issues:  The patient/caregiver has  good  understanding of diagnosis, treatment plan and options for care.     Towana Badger, MD    12/28/2022     The following sections were reviewed this encounter by the provider:   Tobacco  Allergies  Meds  Problems  Med Hx  Surg Hx  Fam Hx          History:   Patient Active Problem List   Diagnosis    Low back ache    Neutropenia    NHL (non-Hodgkin's lymphoma)    Generalized weakness    Nuclear sclerotic cataract of left eye    Cataract, nuclear sclerotic senile, left      Past Medical History:   Diagnosis Date    Arthritis     Rt hand index finger    Back pain     Ear, nose and throat disorder  sinus issues    Encounter for blood transfusion 2015    Gastroesophageal reflux disease     H/O acute pancreatitis 1990's    Low back pain 2014    3 slipped discs    Lymphona, mantle cell, inguinal region/lower limb     Malignant neoplasm 2014    Non Hodgkins lymphoma/s/p chemo    NHL (non-Hodgkin's lymphoma) October 2013    w/ Splenic Nodules    Post-operative nausea and vomiting     Cardiac arrest 34 yrs ago with ruptured appendix/also in 2000 w/endoscopy    Seasonal allergic rhinitis      Past Surgical History:   Procedure Laterality Date    APPENDECTOMY (OPEN)      Age of 56    EXCISION, SQUAMOUS CELL      Right hand/ 2000    EXTRACTION, CATARACT, PHACO, IOL Left 04/07/2016    Procedure: EXTRACTION, CATARACT, PHACO, IOL;  Surgeon: Aline Brochure, MD;  Location: [facility];  Service: Ophthalmology;  Laterality: Left;  LEFT PHACO W/IOL    PLACEMENT, MEDIPORT       Allergies   Allergen Reactions    Aspirin Other (See Comments)     Oral and stomach irritation if taken daily    Codeine Nausea Only    Salicylates       Outpatient Medications Marked as Taking for the 12/28/22 encounter (Office Visit) with Towana Badger,  MD   Medication Sig Dispense Refill    Acetaminophen (TYLENOL PO) Take by mouth as needed      atorvastatin (LIPITOR) 10 MG tablet TAKE ONE TABLET BY MOUTH EVERY NIGHT. 90 tablet 1    MAGNESIUM PO Take by mouth as needed      vitamin B-12 (CYANOCOBALAMIN) 1000 MCG tablet Take 1 tablet (1,000 mcg) by mouth daily       Social History     Tobacco Use    Smoking status: Never    Smokeless tobacco: Never   Vaping Use    Vaping Use: Never used   Substance Use Topics    Alcohol use: No    Drug use: No      Family History   Problem Relation Age of Onset    Heart attack Mother     Heart disease Mother     Parkinsonism Mother     Hyperlipidemia Mother     Heart disease Sister     Heart disease Brother            ===================================================================    Additional Documentation:

## 2022-12-28 NOTE — Progress Notes (Signed)
Have you seen any specialists/other providers since your last visit with [place]?    Yes      The patient was informed that the following HM items are still outstanding:   Health Maintenance Due   Topic Date Due    Advance Directive on File  Never done    Tetanus Ten-Year  Never done    Pneumonia Vaccine Age 87+ (1 - PCV) Never done    CV Echocardiogram  04/26/2022    INFLUENZA VACCINE  06/14/2022    COVID-19 Vaccine (6 - 2023-24 season) 07/15/2022    FALLS RISK ANNUAL  12/15/2022    DEPRESSION SCREENING  12/15/2022    Medicare Annual Wellness Visit  12/15/2022

## 2022-12-30 ENCOUNTER — Telehealth (INDEPENDENT_AMBULATORY_CARE_PROVIDER_SITE_OTHER): Payer: Self-pay | Admitting: Family Medicine

## 2022-12-30 NOTE — Telephone Encounter (Signed)
Patient would like to discuss test results and what are next steps;will medication changes be necessary

## 2023-01-03 NOTE — Telephone Encounter (Signed)
Spoke with pt- she had seen the lab results on MyChart and has no questions about them.

## 2023-01-12 ENCOUNTER — Encounter (INDEPENDENT_AMBULATORY_CARE_PROVIDER_SITE_OTHER): Payer: Self-pay

## 2023-02-21 ENCOUNTER — Other Ambulatory Visit (INDEPENDENT_AMBULATORY_CARE_PROVIDER_SITE_OTHER): Payer: Self-pay | Admitting: Family Medicine

## 2023-03-24 ENCOUNTER — Other Ambulatory Visit (INDEPENDENT_AMBULATORY_CARE_PROVIDER_SITE_OTHER): Payer: Self-pay | Admitting: Family Medicine

## 2023-03-24 MED ORDER — ATORVASTATIN CALCIUM 10 MG PO TABS
10.0000 mg | ORAL_TABLET | Freq: Every evening | ORAL | 0 refills | Status: DC
Start: 2023-03-24 — End: 2023-05-16

## 2023-03-24 NOTE — Telephone Encounter (Signed)
Patient stated she out of Atorvastatin; does she need to take another blood test before she can get another refill

## 2023-05-16 ENCOUNTER — Other Ambulatory Visit (INDEPENDENT_AMBULATORY_CARE_PROVIDER_SITE_OTHER): Payer: Self-pay | Admitting: Family Medicine

## 2023-06-19 ENCOUNTER — Encounter (INDEPENDENT_AMBULATORY_CARE_PROVIDER_SITE_OTHER): Payer: Self-pay | Admitting: Family Medicine

## 2023-06-19 ENCOUNTER — Other Ambulatory Visit (INDEPENDENT_AMBULATORY_CARE_PROVIDER_SITE_OTHER): Payer: Self-pay | Admitting: Allergy & Immunology

## 2023-06-19 ENCOUNTER — Ambulatory Visit (INDEPENDENT_AMBULATORY_CARE_PROVIDER_SITE_OTHER): Payer: Medicare Other | Admitting: Family Medicine

## 2023-06-19 VITALS — BP 121/71 | HR 80 | Temp 92.0°F | Wt 119.6 lb

## 2023-06-19 DIAGNOSIS — J849 Interstitial pulmonary disease, unspecified: Secondary | ICD-10-CM

## 2023-06-19 DIAGNOSIS — G479 Sleep disorder, unspecified: Secondary | ICD-10-CM

## 2023-06-19 NOTE — Progress Notes (Signed)
[facility]                       Date of Exam: 06/19/2023 1:37 PM        Patient ID: Maureen Vasquez is a 89 y.o. female.  Attending Physician: Armandina Stammer, MD        Chief Complaint:    Chief Complaint   Patient presents with    Shortness of Breath     Patient has pulmonary issues                HPI:    HPI: Ms. Bowermaster with h/o HL, ILD, NHL (in remission), acid reflux, osteoporosis is here for follow up.   C/o trouble with staying asleep x few months. No anxiety.   She has shortness of breath on exertion due to ILD. This does not interfere with sleep per pt.           Problem List:    Problem List[1]          Current Meds:    Medications Taking[2]       Allergies:    Allergies[3]          Past Surgical History:    Past Surgical History:   Procedure Laterality Date    APPENDECTOMY (OPEN)      Age of 71    EXCISION, SQUAMOUS CELL      Right hand/ 2000    EXTRACTION, CATARACT, PHACO, IOL Left 04/07/2016    Procedure: EXTRACTION, CATARACT, PHACO, IOL;  Surgeon: Ellery Plunk, MD;  Location: [facility];  Service: Ophthalmology;  Laterality: Left;  LEFT PHACO W/IOL    INSERTION, CATHETER, CENTRAL VENOUS, TUNNELED, WITH SUBCUTANEOUS PORT             Family History:    Family History   Problem Relation Age of Onset    Heart attack Mother     Heart disease Mother     Parkinsonism Mother     Hyperlipidemia Mother     Heart disease Sister     Heart disease Brother            Social History:    Social History[4]        The following sections were reviewed this encounter by the provider:   Tobacco  Allergies  Meds  Problems  Med Hx  Surg Hx  Fam Hx             Vital Signs:    BP 121/71 (BP Site: Right arm, Patient Position: Sitting, Cuff Size: Medium)   Pulse 80   Temp (!) 92 F (33.3 C) (Oral)   Wt 54.3 kg (119 lb 9.6 oz)   SpO2 100%   BMI 26.81 kg/m          ROS:    Review of Systems   HENT:  Negative for congestion and sore throat.    Respiratory:  Positive for shortness of  breath. Negative for cough.    Cardiovascular:  Negative for chest pain and palpitations.   Gastrointestinal:  Negative for nausea and vomiting.   Neurological:  Negative for dizziness.           Physical Exam:    Physical Exam  Vitals reviewed.   Constitutional:       Appearance: Normal appearance.   Cardiovascular:      Rate and Rhythm: Normal rate and regular rhythm.      Heart  sounds: Normal heart sounds.   Pulmonary:      Comments: B/l basal crackles. No wheezing.   Neurological:      Mental Status: She is alert.             Assessment:    1. Sleep disturbance    2. ILD (interstitial lung disease)          Plan:    Sleep hygiene, OTC melatonin (3 mg-10 mg dose) recommended.   Advised pt to f/u with pulmonologist for evaluation of hypopnea or apnea spells during sleep.        Follow-up:     As needed        Armandina Stammer, MD                     [1]   Patient Active Problem List  Diagnosis    Low back ache    Neutropenia    NHL (non-Hodgkin's lymphoma)    Generalized weakness    Nuclear sclerotic cataract of left eye    Cataract, nuclear sclerotic senile, left    ILD (interstitial lung disease)   [2]   Outpatient Medications Marked as Taking for the 06/19/23 encounter (Office Visit) with Armandina Stammer, MD   Medication Sig Dispense Refill    atorvastatin (LIPITOR) 10 MG tablet TAKE ONE TABLET BY MOUTH EVERY NIGHT. 90 tablet 0    MAGNESIUM PO Take by mouth as needed      RABEprazole (ACIPHEX) 20 MG tablet       vitamin B-12 (CYANOCOBALAMIN) 1000 MCG tablet Take 1 tablet (1,000 mcg) by mouth daily     [3]   Allergies  Allergen Reactions    Aspirin Other (See Comments)     Oral and stomach irritation if taken daily    Codeine Nausea Only    Salicylates    [4]   Social History  Tobacco Use    Smoking status: Never    Smokeless tobacco: Never   Vaping Use    Vaping status: Never Used   Substance Use Topics    Alcohol use: Yes     Alcohol/week: 2.0 standard drinks of alcohol     Types: 2 Glasses of wine per week    Drug  use: No

## 2023-06-22 ENCOUNTER — Encounter: Payer: Self-pay | Admitting: Family Medicine

## 2023-08-26 ENCOUNTER — Other Ambulatory Visit (INDEPENDENT_AMBULATORY_CARE_PROVIDER_SITE_OTHER): Payer: Self-pay | Admitting: Family Medicine

## 2023-11-07 ENCOUNTER — Other Ambulatory Visit (INDEPENDENT_AMBULATORY_CARE_PROVIDER_SITE_OTHER): Payer: Self-pay | Admitting: Allergy & Immunology

## 2023-11-10 ENCOUNTER — Ambulatory Visit (INDEPENDENT_AMBULATORY_CARE_PROVIDER_SITE_OTHER): Payer: Medicare Other | Admitting: Family Medicine

## 2023-11-10 ENCOUNTER — Encounter (INDEPENDENT_AMBULATORY_CARE_PROVIDER_SITE_OTHER): Payer: Self-pay | Admitting: Family Medicine

## 2023-11-10 VITALS — BP 127/73 | HR 83 | Temp 96.7°F

## 2023-11-10 DIAGNOSIS — M25551 Pain in right hip: Secondary | ICD-10-CM

## 2023-11-10 DIAGNOSIS — M5416 Radiculopathy, lumbar region: Secondary | ICD-10-CM

## 2023-11-10 NOTE — Progress Notes (Signed)
 [facility] - [facility]                       Date of Exam: 11/10/2023 2:31 PM        Patient ID: Maureen Vasquez is a 89 y.o. female.  Attending Physician: Armandina Stammer, MD        Chief Complaint:    Chief Complaint   Patient presents with

## 2023-12-05 ENCOUNTER — Other Ambulatory Visit (INDEPENDENT_AMBULATORY_CARE_PROVIDER_SITE_OTHER): Payer: Self-pay | Admitting: Family Medicine

## 2023-12-05 ENCOUNTER — Other Ambulatory Visit (INDEPENDENT_AMBULATORY_CARE_PROVIDER_SITE_OTHER): Payer: Self-pay

## 2023-12-05 DIAGNOSIS — M48062 Spinal stenosis, lumbar region with neurogenic claudication: Secondary | ICD-10-CM

## 2023-12-05 DIAGNOSIS — M5416 Radiculopathy, lumbar region: Secondary | ICD-10-CM

## 2023-12-05 DIAGNOSIS — M25551 Pain in right hip: Secondary | ICD-10-CM

## 2023-12-15 ENCOUNTER — Encounter (INDEPENDENT_AMBULATORY_CARE_PROVIDER_SITE_OTHER): Payer: Self-pay

## 2023-12-19 NOTE — Progress Notes (Signed)
 Husband called for wife regarding xray report and would like to discuss with a nurse regarding next steps. Please return husband call at [phone] or [phone]

## 2023-12-19 NOTE — Progress Notes (Unsigned)
Please call patient regarding results of hip and lumbar spine x-rays.  Please call today.  Please advise.    [phone]

## 2023-12-20 ENCOUNTER — Telehealth (INDEPENDENT_AMBULATORY_CARE_PROVIDER_SITE_OTHER): Payer: Self-pay | Admitting: Family Medicine

## 2023-12-20 NOTE — Progress Notes (Signed)
Spoke to patient and husband. Clarified findings and recommendations. Patient's husband is coming to pick up printed our referral and x ray results

## 2023-12-20 NOTE — Telephone Encounter (Signed)
Patient's husband called back, office did receive the results for the X-ray and is now requesting a follow up call from nurse or PCP regarding next steps.

## 2023-12-20 NOTE — Telephone Encounter (Signed)
Per patient's husband was requesting a call back from clinical team explaining why patient has a referral for spine specialist despite not having results for x-ray.     Phone: [phone]

## 2023-12-25 ENCOUNTER — Encounter (INDEPENDENT_AMBULATORY_CARE_PROVIDER_SITE_OTHER): Payer: Self-pay | Admitting: Family Medicine

## 2023-12-25 ENCOUNTER — Ambulatory Visit (INDEPENDENT_AMBULATORY_CARE_PROVIDER_SITE_OTHER): Payer: Medicare Other | Admitting: Family Medicine

## 2023-12-25 VITALS — BP 96/70 | HR 44 | Wt 121.4 lb

## 2023-12-25 DIAGNOSIS — M25551 Pain in right hip: Secondary | ICD-10-CM

## 2023-12-25 DIAGNOSIS — M48062 Spinal stenosis, lumbar region with neurogenic claudication: Secondary | ICD-10-CM

## 2023-12-25 NOTE — Progress Notes (Signed)
 [facility]                       Date of Exam: 12/25/2023 1:51 PM        Patient ID: Maureen Vasquez is a 89 y.o. female.  Attending Physician: Wilburn Hahn, MD        Chief Complaint:    Chief Complaint   Patient presents with    Leg Pain     Right leg; follow up             HPI:    HPI: Maureen Vasquez with h/o HL, ILD, NHL (in remission), shingles (11/2020), acid reflux, osteoporosis, lumbar stenosis is here to follow up on R hip pain and intermittent numbness in R leg x 3-4 months.   Xray lumbar spine (12/01/23) showed multilevel advanced DJD, unchanged Lumbar MRI from 2012. Xray R hip showed mild arthritis. (Both reports scanned under media tab).  Pain is improved on Tylenol .          Problem List:    Problem List[1]          Current Meds:    Medications Taking[2]       Allergies:    Allergies[3]          Past Surgical History:    Past Surgical History[4]        Family History:    Family History[5]        Social History:    Social History[6]        The following sections were reviewed this encounter by the provider:   Tobacco  Allergies  Meds  Problems  Med Hx  Surg Hx  Fam Hx             Vital Signs:    BP 96/70 (BP Site: Left arm, Patient Position: Sitting, Cuff Size: Medium)   Pulse (!) 44   Wt 55.1 kg (121 lb 6.4 oz)   SpO2 99%   BMI 27.22 kg/m          ROS:    Review of Systems   Constitutional:  Negative for fever.   Gastrointestinal:  Negative for nausea and vomiting.   Neurological:  Negative for dizziness.            Physical Exam:    Physical Exam  Vitals reviewed.   Constitutional:       Appearance: Normal appearance.   Musculoskeletal:      Comments: No tenderness over LS spine.    Neurological:      Mental Status: She is alert.      Gait: Gait normal.   Psychiatric:         Mood and Affect: Mood normal.         Behavior: Behavior normal.              Assessment:    1. Lumbar stenosis with neurogenic claudication    2. Right hip pain          Plan:    Xray results  reviewed with pt. Referred to spine specialist for consult and pain management.   Currently symptoms are well controlled on Tylenol  prn.         Follow-up:     As needed        Wilburn Hahn, MD                     [1]   Patient  Active Problem List  Diagnosis    Low back ache    Neutropenia    NHL (non-Hodgkin's lymphoma)    Generalized weakness    Nuclear sclerotic cataract of left eye    Cataract, nuclear sclerotic senile, left    ILD (interstitial lung disease)   [2]   Outpatient Medications Marked as Taking for the 12/25/23 encounter (Office Visit) with Jannetta Barlow, MD   Medication Sig Dispense Refill    atorvastatin  (LIPITOR) 10 MG tablet TAKE ONE TABLET BY MOUTH once EVERY NIGHT. 90 tablet 0    vitamin B-12 (CYANOCOBALAMIN) 1000 MCG tablet Take 1 tablet (1,000 mcg) by mouth daily     [3]   Allergies  Allergen Reactions    Aspirin Other (See Comments)     Oral and stomach irritation if taken daily    Codeine Nausea Only    Salicylates    [4]   Past Surgical History:  Procedure Laterality Date    APPENDECTOMY (OPEN)      Age of 55    EXCISION, SQUAMOUS CELL      Right hand/ 2000    EXTRACTION, CATARACT, PHACO, IOL Left 04/07/2016    Procedure: EXTRACTION, CATARACT, PHACO, IOL;  Surgeon: Viviana Vick GRADE, MD;  Location: [facility];  Service: Ophthalmology;  Laterality: Left;  LEFT PHACO W/IOL    INSERTION, CATHETER, CENTRAL VENOUS, TUNNELED, WITH SUBCUTANEOUS PORT     [5]   Family History  Problem Relation Name Age of Onset    Heart attack Mother      Heart disease Mother      Parkinsonism Mother      Hyperlipidemia Mother      Heart disease Sister      Heart disease Brother     [6]   Social History  Tobacco Use    Smoking status: Never    Smokeless tobacco: Never   Vaping Use    Vaping status: Never Used   Substance Use Topics    Alcohol use: Yes     Alcohol/week: 2.0 standard drinks of alcohol     Types: 2 Glasses of wine per week    Drug use: No

## 2023-12-27 ENCOUNTER — Telehealth (INDEPENDENT_AMBULATORY_CARE_PROVIDER_SITE_OTHER): Payer: Self-pay | Admitting: Family Medicine

## 2023-12-27 ENCOUNTER — Other Ambulatory Visit (INDEPENDENT_AMBULATORY_CARE_PROVIDER_SITE_OTHER): Payer: Self-pay | Admitting: Family Medicine

## 2023-12-27 DIAGNOSIS — M48062 Spinal stenosis, lumbar region with neurogenic claudication: Secondary | ICD-10-CM

## 2023-12-27 DIAGNOSIS — M5416 Radiculopathy, lumbar region: Secondary | ICD-10-CM

## 2023-12-27 NOTE — Telephone Encounter (Signed)
 Patient would like another recommendation for an orthopedic specialist; the other provider that was recommended does not take patients over 88 years old.  Please call patient with recommendation @ [phone]

## 2023-12-28 NOTE — Telephone Encounter (Signed)
 I left a message for the patient to return my call.

## 2023-12-28 NOTE — Telephone Encounter (Signed)
 Patient called back, requested call.

## 2024-01-02 ENCOUNTER — Encounter (INDEPENDENT_AMBULATORY_CARE_PROVIDER_SITE_OTHER): Payer: Self-pay

## 2024-02-05 ENCOUNTER — Other Ambulatory Visit (INDEPENDENT_AMBULATORY_CARE_PROVIDER_SITE_OTHER): Payer: Self-pay | Admitting: Family Medicine

## 2024-02-05 NOTE — Telephone Encounter (Signed)
 Last filled December 2024.   Last o/v February 2025.  Patient does not have an upcoming appointment  Queued up 90 with 0 refills.

## 2024-03-18 ENCOUNTER — Other Ambulatory Visit (INDEPENDENT_AMBULATORY_CARE_PROVIDER_SITE_OTHER): Payer: Self-pay | Admitting: Critical Care Medicine

## 2024-03-18 DIAGNOSIS — R069 Unspecified abnormalities of breathing: Secondary | ICD-10-CM

## 2024-03-18 DIAGNOSIS — I270 Primary pulmonary hypertension: Secondary | ICD-10-CM

## 2024-04-11 ENCOUNTER — Other Ambulatory Visit (INDEPENDENT_AMBULATORY_CARE_PROVIDER_SITE_OTHER): Payer: Self-pay | Admitting: Critical Care Medicine

## 2024-04-11 DIAGNOSIS — R0602 Shortness of breath: Secondary | ICD-10-CM

## 2024-04-14 ENCOUNTER — Other Ambulatory Visit (INDEPENDENT_AMBULATORY_CARE_PROVIDER_SITE_OTHER): Payer: Self-pay | Admitting: Allergy & Immunology

## 2024-04-15 NOTE — Telephone Encounter (Signed)
 Last filled March 2025.   Last o/v February 2025.  Patient does not have an upcoming appointment  Queued up 90 with 0 refills.

## 2024-04-23 ENCOUNTER — Emergency Department

## 2024-04-23 ENCOUNTER — Encounter
Admission: EM | Disposition: A | Discharge status: Home Health | Payer: Self-pay | Source: Home / Self Care | Attending: Internal Medicine

## 2024-04-23 ENCOUNTER — Inpatient Hospital Stay
Admission: EM | Admit: 2024-04-23 | Discharge: 2024-04-24 | DRG: 243 | Disposition: A | Discharge status: Home Health | Attending: Internal Medicine | Admitting: Internal Medicine

## 2024-04-23 ENCOUNTER — Inpatient Hospital Stay

## 2024-04-23 ENCOUNTER — Inpatient Hospital Stay: Admitting: Anesthesiology

## 2024-04-23 DIAGNOSIS — Z9889 Other specified postprocedural states: Secondary | ICD-10-CM

## 2024-04-23 DIAGNOSIS — Z681 Body mass index (BMI) 19 or less, adult: Secondary | ICD-10-CM

## 2024-04-23 DIAGNOSIS — Z8674 Personal history of sudden cardiac arrest: Secondary | ICD-10-CM

## 2024-04-23 DIAGNOSIS — E871 Hypo-osmolality and hyponatremia: Secondary | ICD-10-CM | POA: Diagnosis present

## 2024-04-23 DIAGNOSIS — R636 Underweight: Secondary | ICD-10-CM | POA: Diagnosis present

## 2024-04-23 DIAGNOSIS — Z9221 Personal history of antineoplastic chemotherapy: Secondary | ICD-10-CM

## 2024-04-23 DIAGNOSIS — E785 Hyperlipidemia, unspecified: Secondary | ICD-10-CM | POA: Diagnosis present

## 2024-04-23 DIAGNOSIS — I442 Atrioventricular block, complete: Principal | ICD-10-CM | POA: Diagnosis present

## 2024-04-23 DIAGNOSIS — K219 Gastro-esophageal reflux disease without esophagitis: Secondary | ICD-10-CM | POA: Diagnosis present

## 2024-04-23 DIAGNOSIS — Z79899 Other long term (current) drug therapy: Secondary | ICD-10-CM

## 2024-04-23 DIAGNOSIS — R079 Chest pain, unspecified: Secondary | ICD-10-CM

## 2024-04-23 DIAGNOSIS — Z8572 Personal history of non-Hodgkin lymphomas: Secondary | ICD-10-CM

## 2024-04-23 HISTORY — PX: INSERTION, PACEMAKER COMPLETE: SHX4380

## 2024-04-23 HISTORY — PX: PM IMPLANT DUAL: EP35

## 2024-04-23 LAB — COMPREHENSIVE METABOLIC PANEL
ALT: 24 U/L (ref <=55)
AST (SGOT): 57 U/L — ABNORMAL HIGH (ref <=41)
Albumin/Globulin Ratio: 1.2 (ref 0.9–2.2)
Albumin: 3.7 g/dL (ref 3.5–5.0)
Alkaline Phosphatase: 75 U/L (ref 37–117)
Anion Gap: 10 (ref 5.0–15.0)
BUN: 23 mg/dL — ABNORMAL HIGH (ref 7–21)
Bilirubin, Total: 0.7 mg/dL (ref 0.2–1.2)
CO2: 18 meq/L (ref 17–29)
Calcium: 8.9 mg/dL (ref 7.9–10.2)
Chloride: 101 meq/L (ref 99–111)
Creatinine: 0.8 mg/dL (ref 0.4–1.0)
GFR: >60 mL/min/1.73 m2 (ref >=60.0)
Globulin: 3.1 g/dL (ref 2.0–3.6)
Glucose: 101 mg/dL — ABNORMAL HIGH (ref 70–100)
Potassium: 4.6 meq/L (ref 3.5–5.3)
Protein, Total: 6.8 g/dL (ref 6.0–8.3)
Sodium: 129 meq/L — ABNORMAL LOW (ref 135–145)

## 2024-04-23 LAB — ECG 12-LEAD
Atrial Rate: 113 {beats}/min
P Axis: 78 degrees
P-R Interval: 354 ms
Q-T Interval: 512 ms
QRS Duration: 110 ms
QTC Calculation (Bezet): 407 ms
R Axis: 93 degrees
T Axis: 34 degrees
Ventricular Rate: 38 {beats}/min

## 2024-04-23 LAB — LAB USE ONLY - CBC WITH DIFFERENTIAL
Absolute Basophils: 0.04 x10 3/uL (ref 0.00–0.08)
Absolute Eosinophils: 0.06 x10 3/uL (ref 0.00–0.44)
Absolute Immature Granulocytes: 0.04 x10 3/uL (ref 0.00–0.07)
Absolute Lymphocytes: 1.67 x10 3/uL (ref 0.42–3.22)
Absolute Monocytes: 1.2 x10 3/uL — ABNORMAL HIGH (ref 0.21–0.85)
Absolute Neutrophils: 6.84 x10 3/uL — ABNORMAL HIGH (ref 1.10–6.33)
Absolute nRBC: 0 x10 3/uL (ref <=0.00)
Basophils %: 0.4 %
Eosinophils %: 0.6 %
Hematocrit: 35.1 % (ref 34.7–43.7)
Hemoglobin: 12.1 g/dL (ref 11.4–14.8)
Immature Granulocytes %: 0.4 %
Lymphocytes %: 17 %
MCH: 29.6 pg (ref 25.1–33.5)
MCHC: 34.5 g/dL (ref 31.5–35.8)
MCV: 85.8 fL (ref 78.0–96.0)
MPV: 11.8 fL (ref 8.9–12.5)
Monocytes %: 12.2 %
Neutrophils %: 69.4 %
Platelet Count: 240 x10 3/uL (ref 142–346)
Preliminary Absolute Neutrophil Count: 6.84 x10 3/uL — ABNORMAL HIGH (ref 1.10–6.33)
RBC: 4.09 x10 6/uL (ref 3.90–5.10)
RDW: 14 % (ref 11–15)
WBC: 9.85 x10 3/uL — ABNORMAL HIGH (ref 3.10–9.50)
nRBC %: 0 /100{WBCs} (ref <=0.0)

## 2024-04-23 LAB — NT-PROBNP: NT-ProBNP: 8692 pg/mL — ABNORMAL HIGH (ref <450)

## 2024-04-23 LAB — MAGNESIUM: Magnesium: 2 mg/dL (ref 1.6–2.6)

## 2024-04-23 LAB — HIGH SENSITIVITY TROPONIN-I: hs Troponin: 7.7 ng/L (ref <=14.0)

## 2024-04-23 SURGERY — PM IMPLANT DUAL
Anesthesia: Anesthesia MAC / Sedation

## 2024-04-23 MED ORDER — ONDANSETRON HCL 4 MG/2ML IJ SOLN
4.0000 mg | Freq: Every day | INTRAMUSCULAR | Status: DC | PRN
Start: 2024-04-23 — End: 2024-04-23

## 2024-04-23 MED ORDER — ONDANSETRON 4 MG PO TBDP
4.0000 mg | ORAL_TABLET | Freq: Four times a day (QID) | ORAL | Status: DC | PRN
Start: 2024-04-23 — End: 2024-04-24

## 2024-04-23 MED ORDER — ACETAMINOPHEN 500 MG PO TABS
1000.0000 mg | ORAL_TABLET | Freq: Four times a day (QID) | ORAL | Status: DC | PRN
Start: 2024-04-23 — End: 2024-04-24
  Administered 2024-04-23: 500 mg via ORAL
  Filled 2024-04-23: qty 1

## 2024-04-23 MED ORDER — CEFAZOLIN SODIUM 1 G IJ SOLR
INTRAMUSCULAR | Status: AC
Start: 2024-04-23 — End: 2024-04-23
  Filled 2024-04-23: qty 2000

## 2024-04-23 MED ORDER — ENOXAPARIN SODIUM 40 MG/0.4ML IJ SOSY
40.0000 mg | PREFILLED_SYRINGE | Freq: Every day | INTRAMUSCULAR | Status: DC
Start: 2024-04-23 — End: 2024-04-23

## 2024-04-23 MED ORDER — ATORVASTATIN CALCIUM 10 MG PO TABS
10.0000 mg | ORAL_TABLET | Freq: Every evening | ORAL | Status: DC
Start: 2024-04-23 — End: 2024-04-24
  Administered 2024-04-23: 10 mg via ORAL
  Filled 2024-04-23: qty 1

## 2024-04-23 MED ORDER — GLUCOSE 40 % PO GEL (WRAP)
15.0000 g | ORAL | Status: DC | PRN
Start: 2024-04-23 — End: 2024-04-24

## 2024-04-23 MED ORDER — LACTATED RINGERS IV SOLN
INTRAVENOUS | Status: DC | PRN
Start: 2024-04-23 — End: 2024-04-23

## 2024-04-23 MED ORDER — PROPOFOL 10 MG/ML IV EMUL (WRAP)
INTRAVENOUS | Status: AC
Start: 2024-04-23 — End: 2024-04-23
  Filled 2024-04-23: qty 20

## 2024-04-23 MED ORDER — PROPOFOL 10 MG/ML IV EMUL (WRAP)
INTRAVENOUS | Status: DC | PRN
Start: 2024-04-23 — End: 2024-04-23
  Administered 2024-04-23 (×6): 10 mg via INTRAVENOUS

## 2024-04-23 MED ORDER — LIDOCAINE HCL 1 % IJ SOLN
INTRAMUSCULAR | Status: AC | PRN
Start: 2024-04-23 — End: 2024-04-23
  Administered 2024-04-23: 7 mL

## 2024-04-23 MED ORDER — LIDOCAINE HCL 1 % IJ SOLN
INTRAMUSCULAR | Status: AC
Start: 2024-04-23 — End: 2024-04-23
  Filled 2024-04-23: qty 20

## 2024-04-23 MED ORDER — DEXTROSE 50 % IV SOLN
12.5000 g | INTRAVENOUS | Status: DC | PRN
Start: 2024-04-23 — End: 2024-04-24

## 2024-04-23 MED ORDER — FAMOTIDINE 10 MG/ML IV SOLN (WRAP)
INTRAVENOUS | Status: DC | PRN
Start: 2024-04-23 — End: 2024-04-23
  Administered 2024-04-23: 20 mg via INTRAVENOUS

## 2024-04-23 MED ORDER — PANTOPRAZOLE SODIUM 40 MG PO TBEC
40.0000 mg | DELAYED_RELEASE_TABLET | Freq: Every morning | ORAL | Status: DC
Start: 2024-04-24 — End: 2024-04-24
  Filled 2024-04-23: qty 1

## 2024-04-23 MED ORDER — DEXTROSE 10 % IV BOLUS
12.5000 g | INTRAVENOUS | Status: DC | PRN
Start: 2024-04-23 — End: 2024-04-24

## 2024-04-23 MED ORDER — POLYETHYLENE GLYCOL 3350 17 G PO PACK
17.0000 g | PACK | Freq: Every day | ORAL | Status: DC | PRN
Start: 2024-04-23 — End: 2024-04-24

## 2024-04-23 MED ORDER — ONDANSETRON HCL 4 MG/2ML IJ SOLN
4.0000 mg | Freq: Four times a day (QID) | INTRAMUSCULAR | Status: DC | PRN
Start: 2024-04-23 — End: 2024-04-24

## 2024-04-23 MED ORDER — LIDOCAINE HCL 1 % IJ SOLN
INTRAMUSCULAR | Status: AC | PRN
Start: 2024-04-23 — End: 2024-04-23
  Administered 2024-04-23: 10 mL via INTRAVENOUS

## 2024-04-23 MED ORDER — LIDOCAINE HCL 1 % IJ SOLN
INTRAMUSCULAR | Status: AC | PRN
Start: 2024-04-23 — End: 2024-04-23
  Administered 2024-04-23: 9 mL

## 2024-04-23 MED ORDER — IODIXANOL 320 MG/ML IV SOLN
INTRAVENOUS | Status: AC | PRN
Start: 2024-04-23 — End: 2024-04-23
  Administered 2024-04-23: 10 mL via INTRAMUSCULAR

## 2024-04-23 MED ORDER — GLUCAGON 1 MG IJ SOLR (WRAP)
1.0000 mg | INTRAMUSCULAR | Status: DC | PRN
Start: 2024-04-23 — End: 2024-04-24

## 2024-04-23 MED ORDER — BENZOCAINE-MENTHOL MT LOZG (WRAP)
1.0000 | LOZENGE | OROMUCOSAL | Status: DC | PRN
Start: 2024-04-23 — End: 2024-04-24
  Administered 2024-04-23: 1 via BUCCAL
  Filled 2024-04-23: qty 1

## 2024-04-23 MED ORDER — LIDOCAINE HCL 1 % IJ SOLN
INTRAMUSCULAR | Status: AC
Start: 2024-04-23 — End: 2024-04-23
  Filled 2024-04-23: qty 10

## 2024-04-23 MED ORDER — CEFAZOLIN SODIUM 1 G IJ SOLR
INTRAMUSCULAR | Status: DC | PRN
Start: 2024-04-23 — End: 2024-04-23
  Administered 2024-04-23: 2 g via INTRAVENOUS

## 2024-04-23 SURGICAL SUPPLY — 8 items
CATHETER PACING L43 CM FIX OD7 FR ID5.4 FR NA (Catheter) ×1 IMPLANT
ENVELOPE ABSORBABLE L3.3 IN X W2.9 IN LARGE ANTIBACTERIAL TYRX (Cover) ×1 IMPLANT
GUIDEWIRE VASCULAR OD.035 IN L150 CM 3 MM RADIUS J CURVE FIX CORE PTFE (Guidewire) ×1 IMPLANT
INTRODUCER GUIDEWIRE S-MAKâ„¢ L10 CM OD4 FR ODSEC21 GA STAINLESS STEEL (Sheaths) ×1 IMPLANT
INTRODUCER SHEATH OD7 FR ID.092 IN L13 CM ROBUST VALVE ORANGE (Sheaths) ×2 IMPLANT
LEAD PACING ATRIUM VENTRICLE L52 CM OD6.2 FR ODSEC2 MM 10 MM SPACE (Leads) ×1 IMPLANT
LEAD PACING ATRIUM VENTRICLE L69 CM SELECTSECURE BIPOLAR FIXED SCREW (Leads) ×1 IMPLANT
PACEMAKER 22.5 GM AZURE XT DR MRI SURESCAN D7.4 MM W50.8 MM X H46.6 MM (Pacemaker) ×1 IMPLANT

## 2024-04-23 NOTE — Consults (Signed)
 [facility]  ([phone]) [phone]  [email or web address]     Inpatient Contacts  EpicChat: [facility]   After Hours:   ([phone]) [phone]    Assessment / Plan  89 y.o. female with history of arthritis,  non-Hodgkin's lymphoma, HLD, and syncope who presents with CHB.    CHB    Recommendations  Recommend dual-chamber pacemaker.  Has been NPO since before MN.    Not on HR lowering medications  Will obtain echocardiogram.      We will continue to follow.    Charmaine Channels, APRN  [facility]        Attending attestation    Agree with AP by APP.  Briefly 89 year old female with history of HL, non-hodgkin's lymphoma, and syncope presents to the ED in complete heart block.  She reports a 5 day history of worsening dizziness, weakness, and SOB.  Patient had syncope in 2022 but a workup for this was negative.  She is not on any AV nodal blocking agents.  There are no obvious reversible causes.  She has not had anything to eat or drink today. We discussed the risks, benefits, and alternatives of dual chamber pacemaker.  Patient expresses understanding and wishes to proceed.    - keep patient NPO  - no heparin  products  - avoid AV nodal blocking agents  - dual chamber pacemaker later today             __________________________________________    HPI / Hospital Course  89 year old female with past medical  history significant for arthritis,  non-Hodgkin's lymphoma, HLD, and syncope who presents to [facility] via EMS for increasing dizziness, lightheadedness, SOB, and fatigue.      Husband at bedside. Patient reports SOB over the last several months, but progressively worsening starting on Friday.  She also notes increasing dizziness, lightheadedness, and fatigue since Friday.  She denies CP or syncope.  No N/V, fever, diarrhea, or chills.       Vitals  BP: 156/65 (04/23/2024 12:38 PM)  Temp: 99.7 F (37.6 C) (04/23/2024 12:14 PM)  Temp src: Oral (04/23/2024 12:14 PM)  Heart Rate: (!) 37 (04/23/2024 12:38 PM)  Resp Rate: (!) 24  (04/23/2024 12:38 PM)  SpO2: 99 % (04/23/2024 12:38 PM)  Height: 1.676 m (5' 6) (04/23/2024 12:14 PM)  Weight: 51.7 kg (114 lb) (04/23/2024 12:14 PM)        ECG (04/23/24): SR, CHB    Telemetry: SR, CHB, vent rates in 30s     TTE (2022):   * The left ventricle is small.    * Left ventricular wall thickness is mildly increased.    * Left ventricular systolic function is low normal with an ejection fraction  by Biplane Method of Discs of  56 %.    * Left ventricular segmental wall motion is grossly normal.    * The right ventricular cavity size is normal in size.    * Lower limits of normal right ventricular systolic function.    * There is mild aortic valve sclerosis/fibrocalcific changes.    * There is trace aortic regurgitation.    * The mitral valve is mildly thickened.    * There is mild to moderate mitral regurgitation.    * There is trace to mild tricuspid regurgitation.    * Insufficient tricuspid regurgitation jet to estimate pulmonary artery  systolic pressure.    * The IVC is normal in size with > 50% respiratory variance consistent with  normal  RA pressure of 3 mmHg.    * No pericardial effusion visualized.    * Compared to the prior study dated 02/28/14, there is mild to moderate  mitral regurgitation in this study.

## 2024-04-23 NOTE — Progress Notes (Signed)
 Time admitted: 2045    2 staff member skin assessment (within 4 hours of admission) completed with: Maggie, RN  CHG bath: Completed  Fall precautions and bed alarm protocol reviewed w pt understanding: Yes  Reviewed patient's belongings and home medications: Completed   Home medications sent to or verified by pharmacy?: N/A MAR note placed? N/A  Present central line/foley evaluated: N/A  Arm bands applied (especially purple code status if applicable): Yes  Interpreter request or waiver completed: N/A     04/23/24 2045   Integumentary   Integumentary (WDL) X   General Skin Color Appropriate for ethnicity   Skin Temp Dry;Warm   Skin Assessment Blanchable Redness;Rash;Scars;Surgical incision   Rash Skin Location chest   Scar Location scattered   Blanchable Redness Location back  (pacer pad)   Rash/Lesion Erythematous   Surgical Incision Location L upper chest  (s/p PPM)

## 2024-04-23 NOTE — ED Notes (Signed)
 EP at bedside

## 2024-04-23 NOTE — Anesthesia Postprocedure Evaluation (Signed)
 Anesthesia Post Evaluation    Patient: Maureen Vasquez    PM Implant Dual    Anesthesia type: MAC    Last Vitals:   Vitals Value Taken Time   BP 148/93 04/23/24 19:20   Temp 36.2 C (97.2 F) 04/23/24 19:10   Pulse 91 04/23/24 19:20   Resp 14 04/23/24 19:10   SpO2 95 % 04/23/24 19:20                 Anesthesia Post Evaluation:     Patient Evaluated: PACU    Level of Consciousness: awake and alert  Pain Score: 0  Pain Management: adequate  Non-opioid multimodal analgesia not used between 6 hours prior to anesthesia start and PACU discharge    Airway Patency: patent        Anesthetic complications: No      PONV Status: none    Cardiovascular status: acceptable  Respiratory status: acceptable  Hydration status: acceptable    Quality/Regulatory Reporting Exceptions  Medical reason(s) for not using multimodal pain management: no pain reported    Signed by: Tanda GORMAN Estes, MD, 04/23/2024 7:29 PM

## 2024-04-23 NOTE — H&P (Signed)
 [facility]  Internal Medicine Hospitalists  History and Physical        Assessment / Plan:    Patient is an 89 year old female with a history of HLD, ILD, NHL in remission, GERD, lumbar stenosis, and osteoporosis, presents with a complaint of shortness of breath and chest heaviness, found to be in complete heart block. Planned for PPM today.    Complete heart block  - planned for dual chamber PPM today  - [place] EP following   - avoid AV nodal blocking agents   - holding heparin      Hyponatremia  - mild  - likely hypovolemic as appears dry on exam and has been NPO since yesterday    HLD  - continue lipitor    GERD  - continue pantoprazole (on aciphex)       VTE Prophylaxis:   Place sequential compression device     Foley Catheter: No Foley Present    Venous Access: No Temporary Central Line Present    Medical Readiness for Discharge: Anticipated Tomorrow    Open Handoff Activity in Sidebar    Additional Diagnoses:     Patient has a BMI of 18.4 kg/m2    Underweight       Recent Labs     04/23/24  1226   Sodium 129*     Diagnosis: Mild Hyponatremia         Laboratory Evidence of Acidosis:  Recent Labs   Lab 04/23/24  1226   CO2 18        Acidosis Diagnosis: No additional diagnosis        HPI:      Patient is an 89 year old female with a history of HLD, ILD, NHL in remission, GERD, lumbar stenosis, and osteoporosis, presents with a complaint of shortness of breath and chest heaviness this morning. Per patient, she has had these symptoms for five days. Also complains of dizziness, lightheadedness, nausea, and weakness. She denies chest pain, vomiting, abdominal pain, or dysuria.      Past Medical / Surgical / Family / Social History:     Past Medical History:   Diagnosis Date    Arthritis     Rt hand index finger    Back pain     Basal cell carcinoma     Ear, nose and throat disorder     sinus issues    Encounter for blood transfusion 11/14/2013    Gastroesophageal reflux disease     H/O acute  pancreatitis 07/15/1989    Low back pain 11/14/2012    3 slipped discs    Lymphona, mantle cell, inguinal region/lower limb (CMS/HCC)     Malignant neoplasm (CMS/HCC) 11/14/2012    Non Hodgkins lymphoma/s/p chemo    NHL (non-Hodgkin's lymphoma) (CMS/HCC) 08/14/2012    w/ Splenic Nodules    Post-operative nausea and vomiting     Cardiac arrest 34 yrs ago with ruptured appendix/also in 2000 w/endoscopy    Seasonal allergic rhinitis      Past Surgical History[1]   Family History[2]  Social History[3]    Allergies and Home Medications:     Allergies[4]  Home Medications       Med List Status: RN Completed Set By: Steen Rancher, RN at 04/23/2024  3:22 PM              atorvastatin  (LIPITOR) 10 MG tablet     TAKE ONE TABLET BY MOUTH once EVERY NIGHT.     RABEprazole (ACIPHEX) 20 MG  tablet     Take 1 tablet (20 mg) by mouth once daily     vitamin B-12 (CYANOCOBALAMIN) 1000 MCG tablet     Take 1 tablet (1,000 mcg) by mouth once daily         Patient not taking:       Patient not taking:          Patient not taking:          Objective:      Patient Vitals for the past 24 hrs:   BP Temp Temp src Pulse Resp SpO2 Height Weight   04/23/24 1520 130/72 97.4 F (36.3 C) Oral (!) 28 20 97 % -- --   04/23/24 1430 126/60 -- -- (!) 27 (!) 27 97 % -- --   04/23/24 1410 146/65 -- -- (!) 26 21 96 % -- --   04/23/24 1405 144/63 -- -- (!) 26 21 97 % -- --   04/23/24 1401 127/64 -- -- (!) 26 (!) 23 96 % -- --   04/23/24 1400 143/63 -- -- (!) 26 20 95 % -- --   04/23/24 1336 133/61 -- -- (!) 28 21 97 % -- --   04/23/24 1330 141/65 -- -- (!) 27 (!) 25 98 % -- --   04/23/24 1325 126/60 -- -- (!) 29 (!) 27 97 % -- --   04/23/24 1320 152/66 -- -- (!) 30 (!) 27 98 % -- --   04/23/24 1310 143/66 -- -- (!) 29 (!) 24 97 % -- --   04/23/24 1300 159/66 -- -- (!) 48 (!) 28 97 % -- --   04/23/24 1254 163/68 -- -- (!) 36 (!) 24 97 % -- --   04/23/24 1244 144/66 -- -- (!) 37 -- -- -- --   04/23/24 1238 156/65 -- -- (!) 37 (!) 24 99 % -- --   04/23/24 1230  161/70 -- -- (!) 37 (!) 24 98 % -- --   04/23/24 1214 182/74 99.7 F (37.6 C) Oral (!) 38 18 97 % 1.676 m (5' 6) 51.7 kg (114 lb)      General: awake, alert, oriented x 3; no acute distress  HEENT: anicteric sclerae, PERRL, EOMI; MMM  Cardiovascular: bradycardia; no murmurs, rubs, or gallops  Lungs: clear to auscultation bilaterally without wheezing, rhonchi, or rales  Abdomen: soft, non-distended; non-tender to palpation, no rebound or guarding  Extremities: warm; no LE edema; no clubbing or cyanosis  Neuro: symmetric facial movements, clear speech, moving all extremities                   [1]   Past Surgical History:  Procedure Laterality Date    APPENDECTOMY (OPEN)      Age of 78    EXCISION, SQUAMOUS CELL      Right hand/ 2000    EXTRACTION, CATARACT, PHACO, IOL Left 04/07/2016    Procedure: EXTRACTION, CATARACT, PHACO, IOL;  Surgeon: Viviana Vick GRADE, MD;  Location: [facility];  Service: Ophthalmology;  Laterality: Left;  LEFT PHACO W/IOL    INSERTION, CATHETER, CENTRAL VENOUS, TUNNELED, WITH SUBCUTANEOUS PORT     [2]   Family History  Problem Relation Name Age of Onset    Heart attack Mother      Heart disease Mother      Parkinsonism Mother      Hyperlipidemia Mother      Heart disease Sister      Heart disease Brother     [  3]   Social History  Tobacco Use    Smoking status: Never    Smokeless tobacco: Never   Vaping Use    Vaping status: Never Used   Substance Use Topics    Alcohol use: Yes     Alcohol/week: 2.0 standard drinks of alcohol     Types: 2 Glasses of wine per week    Drug use: No   [4]   Allergies  Allergen Reactions    Aspirin Other (See Comments)     Oral and stomach irritation if taken daily    Codeine Nausea Only    Salicylates

## 2024-04-23 NOTE — Progress Notes (Signed)
 Bedside Safety Handoff:      Time of arrival to PCCU: 2045  Procedure: PPM  Access: L upper chest  Closure: internal sutures, dermabond  Site Condition: Clean, Dry, Intact, Soft and ecchymosis  Medications infusing: stopped before transfer from [facility]      Safety Check with: Damien, RN

## 2024-04-23 NOTE — ED Notes (Signed)
 EP at bedside for transport

## 2024-04-23 NOTE — Progress Notes (Addendum)
 EP DEVICE PROCEDURE HANDOFF REPORT    Date Time: 04/23/24 6:57 PM    INDICATIONS:   Pre-operative Diagnosis: Complete Heart Block     PROCEDURE: Dual Chamber PPM     ALLERGIES:    Aspirin, Codeine, and Salicylates   MEDICAL HISTORY:    Medical History[1]   ACCESS:    INCISION SITE FOR DEVICE:  Left Chest    Dressing: Dermabond    Visual appearance: clean/dry/intact     MEDICATIONS:    See Anesthesia report for all anesthesia meds given    VITALS:    BP: 160/77           Pulse:90          O2 SAT: 98%       Rhythm:  Sinus rhythm      PROCEDURE DETAILS:    SEE PHYSICIANS PROCEDURE NOTE    Device Company: [employer]    Device Rep: Education officer, environmental    DEVICE SETTINGS: DDD 60-120    COMPLICATIONS:  None    Report given to: Lucy RN                                                                   [1]   Past Medical History:  Diagnosis Date    Arthritis     Rt hand index finger    Back pain     Basal cell carcinoma     Ear, nose and throat disorder     sinus issues    Encounter for blood transfusion 11/14/2013    Gastroesophageal reflux disease     H/O acute pancreatitis 07/15/1989    Low back pain 11/14/2012    3 slipped discs    Lymphona, mantle cell, inguinal region/lower limb (CMS/HCC)     Malignant neoplasm (CMS/HCC) 11/14/2012    Non Hodgkins lymphoma/s/p chemo    NHL (non-Hodgkin's lymphoma) (CMS/HCC) 08/14/2012    w/ Splenic Nodules    Post-operative nausea and vomiting     Cardiac arrest 34 yrs ago with ruptured appendix/also in 2000 w/endoscopy    Seasonal allergic rhinitis

## 2024-04-23 NOTE — Plan of Care (Signed)
 Cardiac symptoms: none  Diet: regular   Progressive Mobility: [x]  Walks in room  Level of Assistance: [x]  +1 Assist w/ walker    Nursing Plan:   -pacemaker recovery - L arm precautions  -blood pressure management  -AM pacemaker interrogation  -monitor vitals, electrolytes, symptoms  -discharge planning      Problem: Moderate/High Fall Risk Score >5  Goal: Patient will remain free of falls  Outcome: Progressing  Flowsheets (Taken 04/23/2024 2045)  High (Greater than 13):   HIGH-Apply yellow Fall Risk arm band   HIGH-Utilize chair pad alarm for patient while in the chair   HIGH-Bed alarm on at all times while patient in bed   HIGH-Visual cue at entrance to patient's room   MOD-Place Fall Risk level on whiteboard in room   MOD-Include family in multidisciplinary POC discussions   MOD-Request PT/OT consult order for patients with gait/mobility impairment   MOD-Perform dangle, stand, walk (DSW) prior to mobilization   MOD-Use of assistive devices -Bedside Commode if appropriate   MOD-Remain with patient during toileting   MOD-Consider a move closer to Nurses Station   MOD-Use of chair-pad alarm when appropriate   LOW-Anticoagulation education for injury risk   LOW-Fall Interventions Appropriate for Low Fall Risk     Problem: Hemodynamic Status: Cardiac  Goal: Stable vital signs and fluid balance  Outcome: Progressing  Flowsheets (Taken 04/23/2024 2258)  Stable vital signs and fluid balance:   Assess signs and symptoms associated with cardiac rhythm changes   Monitor lab values

## 2024-04-23 NOTE — Progress Notes (Signed)
 Received report and patient into [facility] PACU [place] [facility] s/p PM Implant. Re Assumed patient's care. ID band verified, alert and oriented x 4, VSS, aldrete 10, pain0/10, rhythm V paced.  Left chest site CDI, no bleeding or hematoma noted. Oriented to room and call bell. Post procedure ECG and CXR completed. Call bell within reach. Report given to Damien, RN at [phone].

## 2024-04-23 NOTE — Procedures (Signed)
 Pacemaker Implant Procedure Note  Indication: complete heart block.    After written, informed consent was obtained the patient was brought to the Electrophysiology Laboratory, prepped and draped in the usual sterile manner. A member of the anesthesia team provided conscious sedation during the procedure. A venogram showed patent axillary and subclavian veins.    Pocket Formation  Prior to the start of the procedure, ancef  IV was given intravenously. Following infiltration with lidocaine , an incision was made parallel to and 1 centimeter beneath the left clavicle to accommodate the size of the device. Using sharp dissection, cautery and blunt dissection, the incision was extended down to the anterior pectoralis fascia, and a pocket was then fashioned caudad and sized to the device. Hemostasis was then obtained using cautery.    Venous Access  Under direct fluoroscopic guidance, the axillary vein was entered twice.    Left Bundle Branch Lead Placement  A 7 Fr sheath was inserted into the vein using the modified Seldinger technique. A His guide sheath was advanced through the 7 Fr sheath over a wire.  A 3830 lead was advanced to the region distal to the His.  Mapping was performed and an adequate site for lead advancement to the left bundle was localized.  After several clockwise turns of the lead, pacing showed stim to peak V5 was 60 ms.  Impedance was within normal limits.  Stimulation and sensing thresholds were found to be satisfactory. The lead was then checked at 10V output, and no diaphragm stimulation was noted. The lead was sutured to the underlying tissue using its suturing sleeve and non-absorbable suture.    Atrial Lead Placement  A 7 Fr sheath was inserted into the vein using the modified Seldinger technique. The lead was placed into the introducer and advanced to the right atrium under fluoroscopy where mapping was performed to find a site with adequate sensing. After the active fixation helix was  extended with fluoroscopic confirmation, stimulation and sensing thresholds were found to be satisfactory. The lead was then checked at 10V output, and no diaphragm stimulation was noted. The lead was sutured to the underlying tissue using its suturing sleeve and non-absorbable suture.    Device Placement and Closure  The pocket was inspected for foreign bodies, then irrigated copiously with normal saline solution. It was then inspected again for hemostasis. The leads were placed into the connector assuring correct positioning, and the screws were tightened. The leads were tugged upon to be sure the connection was secure. The generator was inserted into a Tyrx pouch and then into the pocket with excess lead behind it. It was not sutured to the underlying tissue with a non-absorbable suture.    The pocket was then closed in multiple layers of absorbable suture, dermabond applied, and a sterile dressing was placed over the incision. The sponge and needle counts were correct. Fluoroscopy of the device pocket, and no evidence of any foreign object (gauze, suture, etc.) was noted. The patient was taken to the post-procedure area in stable condition.          Recommendations  Post-implant recommendations:  1. Avoid vascular access through the left jugular and subclavian veins.  2. Patient may shower.  3. No baths, pools, hot tubs for one month.  4. CXR to evaluate for lead dislodgement and PNX.  5. Device interrogation tomorrow morning.  6. Restart all oral medications. Avoid IV or SQ heparin  for at least one week. If heparin  must be used, please contact procedure attending to  discuss.    Follow up/discharge recommendations:  1. Device interrogation in 1-2 weeks.  2. Contact EP office with any questions or concerns.  3. For urgent matters after hours or on weekends, contact the cardiologist on-call.    Wound care:  1. Check your incision for the following (call our office if you notice): redness, drainage (pus or blood),  swelling, warmth, or if you develop fever.  2. If you have questions regarding your wound care, please contact our office.

## 2024-04-23 NOTE — Anesthesia Preprocedure Evaluation (Addendum)
 Anesthesia Evaluation    AIRWAY    Mallampati: II    TM distance: >3 FB    Mouth Opening:full   CARDIOVASCULAR    irregular and bradycardic       DENTAL                     PULMONARY    pulmonary exam normal     OTHER FINDINGS    HR 28                                    Relevant Problems   CARDIO   (+) Complete heart block (CMS/HCC)               Anesthesia Plan    ASA 4 - emergent     MAC                                 informed consent obtained    Plan discussed with CRNA and attending.                     Signed by: Nancylee GORMAN Poche, MD 04/23/24 3:39 PM    04/28/21 echo:  Summary    * The left ventricle is small.    * Left ventricular wall thickness is mildly increased.    * Left ventricular systolic function is low normal with an ejection fraction  by Biplane Method of Discs of  56 %.    * Left ventricular segmental wall motion is grossly normal.    * The right ventricular cavity size is normal in size.    * Lower limits of normal right ventricular systolic function.    * There is mild aortic valve sclerosis/fibrocalcific changes.    * There is trace aortic regurgitation.    * The mitral valve is mildly thickened.    * There is mild to moderate mitral regurgitation.    * There is trace to mild tricuspid regurgitation.    * Insufficient tricuspid regurgitation jet to estimate pulmonary artery  systolic pressure.    * The IVC is normal in size with > 50% respiratory variance consistent with  normal RA pressure of 3 mmHg.    * No pericardial effusion visualized.    * Compared to the prior study dated 02/28/14, there is mild to moderate  mitral regurgitation in this study.

## 2024-04-23 NOTE — ED Notes (Signed)
 Bed: S 17  Expected date: 04/23/24  Expected time: 12:14 PM  Means of arrival: [id number] - [facility]  Comments:

## 2024-04-23 NOTE — ED Provider Notes (Signed)
 [facility]  Emergency Department Physician H&P     Diagnosis/Disposition   ED Disposition:  Admit    ED Diagnosis:  Complete heart block (CMS/HCC)    Discharge Prescription    No medications on file           History of Present Illness     Nursing Triage Note: DIbble @ bedside; L. side EKG; zoll pads placed on chest. PT BIBA  w/ +SOB, +dizziness since Friday. HR 38 on monitor. 1g Atropine given by EMS . - card hx,-Gi/GU s/s,  PT AOx4; following commands. Pt on stretcher w/ call bell in reach.  Chief Complaint: Bradycardia and Shortness of Breath    Maureen Vasquez is a 89 y.o. female with past medical history of non-Hodgkin's lymphoma s/p chemotherapy and interstitial lung disease brought in by ambulance for 4 days of dizziness, shortness of breath in the setting of bradycardia.    EMS reports that their twelve-lead indicated complete heart block en route to the ED.  The patient denies any chest pain.  She reports mild shortness of breath and dizziness.  No diaphoresis, nausea or vomiting.  No prior history of stroke or MI.  No prior history of arrhythmia.  No recent pain/swelling to her lower extremities.  No numbness/weakness to her extremities.    She was bradycardic to the 30s en route to the ED, but her other vital signs otherwise remained within normal limits.  EMS administered 1 mg of atropine prior to arrival.  The patient does not currently follow with a cardiologist or electrophysiologist.      Physical Exam   Pulse (!) 38  BP 182/74  Resp 18  SpO2 97 %  Temp 99.7 F (37.6 C)     Physical Exam  Constitutional:       Appearance: Normal appearance.      Comments: Ill-appearing.   HENT:      Head: Normocephalic and atraumatic.      Right Ear: External ear normal.      Left Ear: External ear normal.      Nose: Nose normal. No congestion.   Eyes:      Pupils: Pupils are equal, round, and reactive to light.   Cardiovascular:      Rate and Rhythm: Normal rate and regular rhythm.      Pulses:  Normal pulses.      Heart sounds: Normal heart sounds.   Pulmonary:      Effort: Pulmonary effort is normal. No respiratory distress.      Breath sounds: Normal breath sounds. No wheezing.   Abdominal:      General: Abdomen is flat. There is no distension.      Palpations: Abdomen is soft.      Tenderness: There is no abdominal tenderness.   Musculoskeletal:         General: Normal range of motion.      Cervical back: Normal range of motion.      Right lower leg: No edema.      Left lower leg: No edema.   Skin:     General: Skin is warm.      Capillary Refill: Capillary refill takes less than 2 seconds.   Neurological:      General: No focal deficit present.      Comments: A&Ox2   Psychiatric:         Mood and Affect: Mood normal.         Behavior: Behavior normal.  Medical Decision Making     Initial Differential Diagnosis:  Initial differential diagnosis to include but not limited to: CHB, sinus brady, sick sinus, med reaction    Final Impression:  89 year old female comes in today with complete heart block.  Symptomatic.  Blood pressure stable.  No improvement with atropine given by EMS.  Did not give additional meds.  Placed on the ZOLL.  Consulted EP emergently for pacemaker placement.  Will go today to the get her pacemaker  The patient was admitted and handed off to Krecko, MD. We discussed all aspects of the work-up that had been completed in the ED, any pending studies/therapies, and all clinical decision making at the time care was handed off.          Medical Decision Making  Amount and/or Complexity of Data Reviewed  Labs: ordered.  Radiology: ordered.  ECG/medicine tests: ordered.    Risk  Decision regarding hospitalization.                                    Interpretations   O2 Sat:  The patient's oxygen saturation was 97 % on room air. This was independently interpreted by me as Normal.     ED EKG Interpretation:  EKG Interpretation  Interpreted by: Carlynn Annabell PARAS, MD at 1:27 PM on  04/23/24  Sinus bradycardia at rate 38. Normal QRS. No ischemic changes. Impression: Complete Heart Block              Procedures      Procedures      Critical Care Time(not including procedures): 41 minutes.  Due to the high risk of critical illness or multi-organ failure at initial presentation and/or during ED course.   System(s) at risk for compromise:  circulatory and respiratory  Critical Diagnosis:   1. Complete heart block (CMS/HCC)    2. CHB (complete heart block) (CMS/HCC)       The patient was Hypotensive:   No    The patient was Hypoxic:   No    This does not including time spent performing other reported procedures or services.  Critical care time involved full attention to the patient's condition and included:   Review of nursing notes and/or old charts - Yes  Documentation time - Yes  Care, transfer of care, and discharge plans - Yes  Obtaining necessary history from family, EMS, nursing home staff and/or treating physicians - Yes  Review of medications, allergies, and vital signs - Yes   Consultant collaboration on findings and treatment options - Yes  Ordering, interpreting, and reviewing diagnostic studies/tab tests - Yes         Supplemental Encounter Data   Medical History[6]  Past Surgical History[7]  Social History[8]  Family History[9]  Allergies[10]    Encounter Orders:  Orders Placed This Encounter   Procedures    XR Chest  AP Portable    CBC with Differential (Order)    Comprehensive Metabolic Panel    High Sensitivity Troponin-I    NT-ProBNP    Magnesium     CBC with Differential (Component)    ECG 12 Lead    Adult Admit to Inpatient ([facility] Only)     Medications Administered:  Medications - No data to display  Laboratory and Imaging Studies:  Results for orders placed or performed during the hospital encounter of 04/23/24 (from the past 24 hours)   Comprehensive Metabolic Panel  Collection Time: 04/23/24 12:26 PM   Result Value    Glucose 101 (H)    BUN 23 (H)    Creatinine 0.8     Sodium 129 (L)    Potassium 4.6    Chloride 101    CO2 18    Calcium  8.9    Anion Gap 10.0    GFR >60.0    AST (SGOT) 57 (H)    ALT 24    Alkaline Phosphatase 75    Albumin 3.7    Protein, Total 6.8    Globulin 3.1    Albumin/Globulin Ratio 1.2    Bilirubin, Total 0.7   High Sensitivity Troponin-I    Collection Time: 04/23/24 12:26 PM   Result Value    hs Troponin 7.7    Collection Time: 04/23/24 12:26 PM   Result Value    NT-ProBNP 8,692 (H)    Collection Time: 04/23/24 12:26 PM   Result Value    Magnesium  2.0   CBC with Differential (Component)    Collection Time: 04/23/24 12:26 PM   Result Value    WBC 9.85 (H)    Hemoglobin 12.1    Hematocrit 35.1    Platelet Count 240    MPV 11.8    RBC 4.09    MCV 85.8    MCH 29.6    MCHC 34.5    RDW 14    nRBC % 0.0    Absolute nRBC 0.00    Preliminary Absolute Neutrophil Count 6.84 (H)    Neutrophils % 69.4    Lymphocytes % 17.0    Monocytes % 12.2    Eosinophils % 0.6    Basophils % 0.4    Immature Granulocytes % 0.4    Absolute Neutrophils 6.84 (H)    Absolute Lymphocytes 1.67    Absolute Monocytes 1.20 (H)    Absolute Eosinophils 0.06    Absolute Basophils 0.04    Absolute Immature Granulocytes 0.04     XR Chest  AP Portable   Final Result      Hyperaeration of the lungs. Subpleural fibrotic changes of the lung bases.      [name], DO   04/23/2024 1:04 PM          Attestations   Scribe Attestation: I was acting as a Neurosurgeon for Avigayil Ton, Annabell PARAS, MD on Pavia,Candyce F  Kushan, Julia     I am the first provider for this patient and I personally performed the services documented. Kushan, Julia is scribing for me on [facility] F. This note and the patient instructions accurately reflect work and decisions made by me.  Idris Edmundson, Annabell PARAS, MD    I am the primary attending physician of record for this patient.          [6]   Past Medical History:  Diagnosis Date    Arthritis     Rt hand index finger    Back pain     Basal cell carcinoma     Ear, nose and throat disorder      sinus issues    Encounter for blood transfusion 11/14/2013    Gastroesophageal reflux disease     H/O acute pancreatitis 07/15/1989    Low back pain 11/14/2012    3 slipped discs    Lymphona, mantle cell, inguinal region/lower limb (CMS/HCC)     Malignant neoplasm (CMS/HCC) 11/14/2012    Non Hodgkins lymphoma/s/p chemo    NHL (non-Hodgkin's lymphoma) (CMS/HCC) 08/14/2012    w/ Splenic  Nodules    Post-operative nausea and vomiting     Cardiac arrest 34 yrs ago with ruptured appendix/also in 2000 w/endoscopy    Seasonal allergic rhinitis    [7]   Past Surgical History:  Procedure Laterality Date    APPENDECTOMY (OPEN)      Age of 40    EXCISION, SQUAMOUS CELL      Right hand/ 2000    EXTRACTION, CATARACT, PHACO, IOL Left 04/07/2016    Procedure: EXTRACTION, CATARACT, PHACO, IOL;  Surgeon: Viviana Vick GRADE, MD;  Location: [facility];  Service: Ophthalmology;  Laterality: Left;  LEFT PHACO W/IOL    INSERTION, CATHETER, CENTRAL VENOUS, TUNNELED, WITH SUBCUTANEOUS PORT     [8]   Social History  Tobacco Use    Smoking status: Never    Smokeless tobacco: Never   Vaping Use    Vaping status: Never Used   Substance Use Topics    Alcohol use: Yes     Alcohol/week: 2.0 standard drinks of alcohol     Types: 2 Glasses of wine per week    Drug use: No   [9]   Family History  Problem Relation Name Age of Onset    Heart attack Mother      Heart disease Mother      Parkinsonism Mother      Hyperlipidemia Mother      Heart disease Sister      Heart disease Brother     [10]   Allergies  Allergen Reactions    Aspirin Other (See Comments)     Oral and stomach irritation if taken daily    Codeine Nausea Only    Salicylates         Lluvia Gwynne J, MD  04/23/24 1506

## 2024-04-24 ENCOUNTER — Encounter: Payer: Self-pay | Admitting: Cardiovascular Disease

## 2024-04-24 LAB — ECG 12-LEAD
Atrial Rate: 96 {beats}/min
P Axis: 81 degrees
P-R Interval: 208 ms
Q-T Interval: 400 ms
QRS Duration: 114 ms
QTC Calculation (Bezet): 505 ms
R Axis: -40 degrees
T Axis: 81 degrees
Ventricular Rate: 96 {beats}/min

## 2024-04-24 LAB — BASIC METABOLIC PANEL
Anion Gap: 11 (ref 5.0–15.0)
BUN: 20 mg/dL (ref 7–21)
CO2: 16 meq/L — ABNORMAL LOW (ref 17–29)
Calcium: 8.7 mg/dL (ref 7.9–10.2)
Chloride: 102 meq/L (ref 99–111)
Creatinine: 0.7 mg/dL (ref 0.4–1.0)
GFR: >60 mL/min/1.73 m2 (ref >=60.0)
Glucose: 86 mg/dL (ref 70–100)
Potassium: 4.3 meq/L (ref 3.5–5.3)
Sodium: 129 meq/L — ABNORMAL LOW (ref 135–145)

## 2024-04-24 LAB — MAGNESIUM: Magnesium: 1.9 mg/dL (ref 1.6–2.6)

## 2024-04-24 MED ORDER — MAGNESIUM OXIDE 400 MG TABS (WRAP)
400.0000 mg | ORAL_TABLET | Freq: Once | ORAL | Status: AC
Start: 2024-04-24 — End: 2024-04-24
  Administered 2024-04-24: 400 mg via ORAL
  Filled 2024-04-24: qty 1

## 2024-04-24 MED ORDER — ACETAMINOPHEN 500 MG PO TABS
1000.0000 mg | ORAL_TABLET | Freq: Four times a day (QID) | ORAL | Status: AC | PRN
Start: 2024-04-24 — End: ?

## 2024-04-24 NOTE — Consults (Signed)
 Start [facility] Note  Home Health Referral    Referral from Olivia Sines (Case Manager) for home health care upon discharge.    By [employer], the patient has the right to freely choose a home care provider.    A company of the patients choosing. We have supplied the patient with a listing of providers in your area who asked to be included and participate in Medicare.   [facility] a home care agency that provides adult home care services and participates in Medicare   The preferred provider of your insurance company. Choosing a home care provider other than your insurance company's preferred provider may affect your insurance coverage.      Home Health Discharge Information    Your doctor has ordered Skilled Nursing, Physical Therapy, and Occupational Therapy in-home service(s) for you while you recuperate at home, to assist you in the transition from hospital to home.    The agency that you or your representative chose to provide the service:  Name of Home Health Agency Placement: [facility]]  Phone: [phone]    The above services were set up by:  Clent Ivy, RN (Post Acute Care Coordinator)   Phone: [phone]    IF YOU HAVE NOT HEARD FROM YOUR HOME HEALTH AGENCY WITHIN 24-48 HOURS AFTER DISCHARGE PLEASE CALL YOUR AGENCY TO ARRANGE A TIME FOR YOUR FIRST VISIT. FOR ANY SCHEDULING CONCERNS OR QUESTIONS RELATED TO HOME HEALTH, SUCH AS TIME OR DATE PLEASE CONTACT YOUR HOME HEALTH AGENCY AT THE NUMBER LISTED ABOVE.    Additional comments:    I spoke with the patient's husband regarding hh referral. No preference in agency. Agreeable to [facility]. Demographics verified.     START PATIENT REGISTRATION INFORMATION     Order Information  Order Signing Physician: Karin Alfrieda SAILOR, MD    Service Ordered RN ?: Yes  Service Ordered PT ?: Yes  Service Ordered OT ?: Yes  Service Ordered ST ?: No    Service Ordered MSW?: No    Service Ordered HHA?: No    Following Physician:  Jannetta Barlow, MD   Following Physician Phone: [phone]   Overseeing Physician: N/A  (Required for Residents only)   Agreeable to Follow?: N/A  Spoke with: N/A  Date/Time of Call: 04/24/24 11:07 AM      Care Coordination   SOC Call from [facility] Required?: no  Same Day [facility]?: no  Primary Care Physician:Faiqa Jannetta, MD  Primary Care Physician Phone:[phone]  Primary Care Physician Address: [address] [facility] [address] [place] [address]  PCP NPI: [id number]  Visit Instructions: N/A  Service Discharge Location Type: Home  Service Facility Name: N/A  Service Floor Facility: N/A  Service Room No: N/A    Demographics  Patient Last Name: Vicci   Patient First Name: Ronal  Language/Communication Barrier: no  Service Address: [address]  [place] [address]   Service Home Phone: [phone] (home)   Other phone numbers:    Telephone Information:   Mobile [phone]     Emergency Contact: Extended Emergency Contact Information  Primary Emergency Contact: Leppert,Adam  Mobile Phone: [phone]  Relation: Son  Preferred language: English  Interpreter needed? No  Secondary Emergency Contact: Biscoe,Allan  Address: [address] [facility] [place]           [place], [place] [address] [place]  Home Phone: [phone]  Mobile Phone: [phone]  Relation: Spouse    Admission Information  Admit Date: 04/23/2024  Patient Status at discharge: Inpatient  Admitting Diagnosis: Complete heart block (CMS/HCC) [I44.2]  CHB (complete heart block) (CMS/HCC) [I44.2]     Caregiver Information  Caregiver First Name: ALLAN   Caregiver Last Name: [facility]   Caregiver Relationship to Patient: Spouse  Caregiver Phone Number: [phone]   Caregiver Notes: N/A            Data processing manager Information  Primary Subscriber:   Primary Subscriber Relation To Guarantor:   Primary Payor:   Primary Plan:   Primary Group #:    Primary Subscriber ID:    Primary Subscriber DOB:   Secondary Insurance  Information  Secondary Subscriber:   Secondary Subscriber Relation To Guarantor:   Secondary Payor:   Secondary Plan:   Secondary Group #:   Secondary Subscriber ID:   Secondary Subscriber DOB:   HITECH  NO      END PATIENT REGISTRATION INFORMATION       Diagnosis: Complete heart block (CMS/HCC) [I44.2]  CHB (complete heart block) (CMS/HCC) [I44.2]    Start [facility] Summary        Additional Comments: n/a    End [facility] Summary     Discharge Date:  04/24/2024      Referral Source  Signed by: Clent Ivy, RN  Date Time: 04/24/24 11:07 AM      End [facility] Note     Home Health face-to-face (FTF) Encounter (Order [id number])  Consult  Date: 04/24/2024 Department: [facility] Ordering/Authorizing: Karin Alfrieda SAILOR, MD     Order Information    Order Date/Time Release Date/Time Start Date/Time End Date/Time   04/24/24 10:57 AM None 04/24/24 10:57 AM 04/24/24 10:57 AM     Order Details    Frequency Duration Priority Order Class   Once 1  occurrence Routine Hospital Performed     Standing Order Information    Remaining Occurrences Interval Last Released     0/1 Once 04/24/2024              Provider Information    Ordering User Ordering Provider Authorizing Provider   [place], Clent, RN Karin Alfrieda SAILOR, MD Karin Alfrieda SAILOR, MD   Attending Provider(s) Admitting Provider PCP   Dimbil, Annabell PARAS, MD; [place], Karishma A, MD; Karin Alfrieda SAILOR, MD Acie Small, MD Jannetta Barlow, MD     Verbal Order Info    Action Created on Order Mode Entered by Responsible Provider Signed by Signed on   Ordering 04/24/24 1057 Telephone with readback Ivy Clent, RN Karin Alfrieda SAILOR, MD             Comments    Primary Diagnosis:  Complete heart block (CMS/HCC) [I44.2]  CHB (complete heart block) (CMS/HCC) [I44.2]    Following Physician:  Jannetta Barlow, MD  Address: [address] [facility] [address] [place] [address]  Phone: [phone]    Fax: [phone]                Home Health face-to-face (FTF) Encounter:  Patient Communication     Not Released  Not seen         Order Questions    Question Answer   Date I saw the patient face-to-face: 04/24/2024   Evidence this patient is homebound because: C.  Decreased endurance, strength, ROM, cadence, safety/judgment during mobility    F.  Deconditioned due to advance disease process requiring assistance to leave home   Medical conditions that necessitate Home Health care: B.  Functional impairment due to recent hospitalization/procedure/treatment    F.  New diagnosis & treatment requiring follow up monitoring and management   Per clinical findings, following services are medically necessary: Skilled Nursing    PT    OT   Clinical findings that support the need for Skilled Nursing. SN will: C. Monitor for signs and symptoms of exacerbation of disease and management    D. Review medication reconciliation, manage and educate on use and side effects    G. Educate on new diagnosis, treatment & management to prevent re-hospitalization    H. Assess cardiopulmonary status and monitor for signs &symptoms of exacerbation    I.  Educate dietary and or fluid restrictions and weight management   Clinical findings that support the need for Physical Therapy. PT will A.  Evaluate and treat functional impairment and improve mobility    C.  Educate on weight bearing status, stair/gait training, balance & coordination    E.  Educate on functional mobility; bed, chair, sit, stand and transfer activities    G.  Implement activities to improve stance time, cadence & step length    I.  Instruct on restorative activities to restore ability to perform ADL   OT will provide assistance with: Home program to improve ability to perform ADLs                    Process Instructions    Please select Home Care Services medically necessary.    Based on the above findings, I certify that this patient is confined to the home and needs intermittent skilled nursing care, physical therapry and / or speech therapy or  continues to need occupational therapy. The patient is under my care, and I have initiated the establishment of the plan of care. This patient will be followed by a physician who will periodically review the plan of care.     Collection Information            Consult Order Info    ID Description Priority Start Date Start Time   [id number] Home Health face-to-face (FTF) Encounter Routine 04/24/2024 10:57 AM   Provider Specialty Referred to   ______________________________________ _____________________________________         Acknowledgement Info    For At Acknowledged By Acknowledged On   Placing Order 04/24/24 1057 Hope Elida Dragon, RN 04/24/24 1107                     Verbal Order Info    Action Created on Order Mode Entered by Responsible Provider Signed by Signed on   Ordering 04/24/24 1057 Telephone with doyal Veronia Burr, RN Karin Alfrieda SAILOR, MD             Patient Information    Patient Name  Damariz, Paganelli Legal Sex  Female DOB  February 02, 1934       Reprint Order Requisition    Home Health face-to-face (FTF) Encounter (Order #[id number]) on 04/24/24       Additional Information    Associated Reports External References   Priority and Order Details [employer]            [facility]   [facility]        Patient Name: KEMARIA, DEDIC     MRN: [id number]      CSN: [id number]         Account Information     Hosp Acct #   [id number] Patient Class  Inpatient Service  Medicine Accommodation Code  Telemetry      Admission Information     Admitting Physician:  Attending Physician: Acie Small, MD  Karin Alfrieda SAILOR, MD Unit  HV PCCU PROG CO* L&D Status      Admitting Diagnosis: Complete heart block (CMS/HCC); CHB (complete heart block) (CMS/HCC) Room / Bed  FI151/FI151-01 L&D - Last Menstrual Cycle      Chief Complaint: Bradycardia; Shortness o*     Admit Type:  Admit Date/Time:  Discharge Date/Time: Emergency  04/23/2024 / 1214   /  Length of Stay: 1 Days    L&D EDD   Estimated Date of Delivery:  None noted.      Patient Information              Home Address: [address]  [place] [address] Employer:  Employer Address:       ,     Main Phone: [phone] Employer Phone:     SSN: [id number]       DOB: Jun 30, 1934 (89 yrs)       Sex: Female Primary Care Physician: Jannetta Barlow, MD   Marital Status: Married Referring Physician:       No ref. provider found   Race: White or Caucasian       Ethnicity: Not of Hispanic/Latino/S*       Emergency Contacts  Name Home Phone Work Phone Mobile Phone Relationship NASHAYLA, TELLERIA     [phone] TUJUANA, KILMARTIN [phone]   [phone] Spouse           Guarantor Information     Guarantor Name: DALYA, MASELLI Guarantor ID: [id number]   Guarantor Relationship to Pt: Self Guarantor Type: Personal/Family   Guarantor DOB:    10-31-1934 Billing Indicator:        Guarantor Address: [address] [place]   [place], [place] [address]          Guarantor Home Phone: [phone] Guarantor Employer:        Guarantor Work Phone:   Pensions consultant Emp Phone:                     Chief Strategy Officer Name: [employer] Subscriber Name: ZEFFIE, BICKERT   Insurance Address:    [address]  [place], [place]  [address] Subscriber DOB: [birth year]      Subscriber ID: [id number]   Insurance Phone:   Pt Relationship to Sub:   Self   Insurance ID:   Coverage Eff From: 11/14/2000   Group Name:   Preauthorization #:     Group #:   Preauthorization Days:        Secondary Insurance     Insurance Name: FEP BCBS-FEP BCBS Subscriber Name: [facility]   Insurance Address:    [id number]  [place], [place]  [address] Subscriber DOB: 03/25/1936      Subscriber ID: [id number]   Insurance Phone:   Pt Relationship to Sub:   Spouse   Insurance ID:   Coverage Eff From: 11/21/1982   Group Name:   Preauthorization #:     Group #: [id number] Preauthorization Days:

## 2024-04-24 NOTE — Progress Notes (Signed)
 [facility]  [phone]  [email or web address]     Inpatient Contacts  EpicChat: [facility]   After Hours:   [phone]    Assessment / Plan  89 y.o. female with history of arthritis,  non-Hodgkin's lymphoma, HLD, and syncope who presents with CHB.    CHB  S/p dual-chamber pacemaker    Recommendations  Normal device function  CXR - no pneumothorax  Incision C/D/I  Activity and arm restrictions  F/U scheduled in 2 weeks      Charmaine Channels, APRN  [facility]        Agree with A/P by APP.  Patient had no events overnight.  Today patient has significant improvement in energy.  She denies pain at implant site.  Site has ecchymosis but no hematoma or erythema. Device interrogation shows stable lead parameters within normal limits.  CXR shows no no PNX or lead dislodgement.      -FU in EP clinic as scheduled  -arm restrictions as directed  -tylenol  for pain    Will sign off at this time.      I performed the substantive portion of the visit by personally conducting the medical decision making in its entirety. The patient was also seen by an APP/fellow for the remaining portion of the visit. I reviewed and updated the documented findings and plan of care accordingly.           __________________________________________    HPI / Hospital Course  89 year old female with past medical  history significant for arthritis,  non-Hodgkin's lymphoma, HLD, and syncope who presents to [facility] via EMS for increasing dizziness, lightheadedness, SOB, and fatigue.      Husband at bedside. Patient reports SOB over the last several months, but progressively worsening starting on Friday.  She also notes increasing dizziness, lightheadedness, and fatigue since Friday.  She denies CP or syncope.  No N/V, fever, diarrhea, or chills.       Vitals  BP: 144/70 (04/24/2024 11:15 AM)  Temp: 97.4 F (36.3 C) (04/24/2024 11:15 AM)  Temp src: Temporal (04/24/2024 11:15 AM)  Heart Rate: 90 (04/24/2024 11:15 AM)  Resp Rate: 18  (04/24/2024 11:15 AM)  SpO2: 97 % (04/24/2024 11:15 AM)  Height: 1.664 m (5' 5.5) (04/23/2024  8:45 PM)  Weight: 44.8 kg (98 lb 12.8 oz) (04/24/2024  2:00 AM)        ECG (04/23/24): SR, CHB    Telemetry: ST, CHB, vent rates in 30s     TTE (2022):   * The left ventricle is small.    * Left ventricular wall thickness is mildly increased.    * Left ventricular systolic function is low normal with an ejection fraction  by Biplane Method of Discs of  56 %.    * Left ventricular segmental wall motion is grossly normal.    * The right ventricular cavity size is normal in size.    * Lower limits of normal right ventricular systolic function.    * There is mild aortic valve sclerosis/fibrocalcific changes.    * There is trace aortic regurgitation.    * The mitral valve is mildly thickened.    * There is mild to moderate mitral regurgitation.    * There is trace to mild tricuspid regurgitation.    * Insufficient tricuspid regurgitation jet to estimate pulmonary artery  systolic pressure.    * The IVC is normal in size with > 50% respiratory variance consistent with  normal RA pressure of  3 mmHg.    * No pericardial effusion visualized.    * Compared to the prior study dated 02/28/14, there is mild to moderate  mitral regurgitation in this study.

## 2024-04-24 NOTE — Plan of Care (Signed)
 Report received from [place], RN and assumed care of patient at 4 AM      Cardiac symptoms: Denies  Diet: Regular  Fluid Restriction? N/A  Progressive Mobility: [x]  Walks in room   Level of Assistance:  [x]  +1 Assist     Nursing Plan:     - Monitor pacemaker insertion site and maintain left arm restrictions.   - Monitor pain/soreness and offer analgesics as ordered  - Pacemaker interrogation in AM  - Monitor cardiac rate, rhythm, symptoms  - Monitor vital signs, labs, intake and output  - Possible discharge planning 6/11      Problem: Pain interferes with ability to perform ADL  Goal: Pain at adequate level as identified by patient  Outcome: Progressing  Flowsheets (Taken 04/24/2024 0444)  Pain at adequate level as identified by patient:   Identify patient comfort function goal   Assess for risk of opioid induced respiratory depression, including snoring/sleep apnea. Alert healthcare team of risk factors identified.   Assess pain on admission, during daily assessment and/or before any as needed intervention(s)   Reassess pain within 30-60 minutes of any procedure/intervention, per Pain Assessment, Intervention, Reassessment (AIR) Cycle   Evaluate if patient comfort function goal is met   Evaluate patient's satisfaction with pain management progress   Offer non-pharmacological pain management interventions   Consult/collaborate with Pain Service   Consult/collaborate with Physical Therapy, Occupational Therapy, and/or Speech Therapy   Include patient/patient care companion in decisions related to pain management as needed     Problem: Compromised Activity/Mobility  Goal: Activity/Mobility Interventions  Outcome: Progressing  Flowsheets (Taken 04/24/2024 0444)  Activity/Mobility Interventions: Pad bony prominences, TAP Seated positioning system when OOB, Promote PMP, Reposition q 2 hrs / turn clock, Offload heels     Problem: Moderate/High Fall Risk Score >5  Goal: Patient will remain free of falls  Outcome:  Progressing  Flowsheets (Taken 04/24/2024 0400)  High (Greater than 13):   HIGH-Visual cue at entrance to patient's room   HIGH-Bed alarm on at all times while patient in bed   HIGH-Utilize chair pad alarm for patient while in the chair   HIGH-Apply yellow Fall Risk arm band   HIGH-Pharmacy to initiate evaluation and intervention per protocol   HIGH-Consider use of low bed     Problem: Hemodynamic Status: Cardiac  Goal: Stable vital signs and fluid balance  Outcome: Progressing  Flowsheets (Taken 04/24/2024 0444)  Stable vital signs and fluid balance:   Assess signs and symptoms associated with cardiac rhythm changes   Monitor lab values     Problem: Inadequate Tissue Perfusion  Goal: Adequate tissue perfusion will be maintained  Outcome: Progressing  Flowsheets (Taken 04/24/2024 0444)  Adequate tissue perfusion will be maintained:   Monitor/assess lab values and report abnormal values   Monitor/assess neurovascular status (pulses, capillary refill, pain, paresthesia, paralysis, presence of edema)   Monitor/assess for signs of VTE (edema of calf/thigh redness, pain)   Monitor for signs and symptoms of a pulmonary embolism (dyspnea, tachypnea, tachycardia, confusion)   Encourage/assist patient as needed to turn, cough, and perform deep breathing every 2 hours

## 2024-04-24 NOTE — Plan of Care (Addendum)
 RN visited pt to review education and AVS. PT/OT came to eval pt. Pt spouse prefers pt to rest a little before the instructions are reviewed. RN will revisit pt, bedside RN updated.    Addendum @ 1130: Updated copy of AVS printed and provided to pt son with [facility] info and phone number. Bedside RN updated and will review with pt and pt spouse. Pt spouse currently speaking to case manager via phone.

## 2024-04-24 NOTE — Progress Notes (Signed)
 04/24/24 1049   Medicare Checklist   Is this a Medicare patient? Yes   Patient received 1st IMM Letter? Yes   Discharge Disposition   Patient preference/choice provided? Yes   Physical Discharge Disposition Home, Home Health-[facility] SN/PT/OT   Receiving facility, unit and room number: N/A   Nursing report phone number: N/A   Facility fax number: N/A   Mode of Transportation Car  Anntoinette, Haefele (Son)  [phone])   Patient/Family/POA notified of transfer plan Yes  Landa, Mullinax (Son)  [phone]  Emaya, Preston (spouse) [phone])   Patient agreeable to discharge plan/expected d/c date? Yes   Family/POA agreeable to discharge plan/expected d/c date? Yes   Bedside nurse notified of transport plan? Yes   Outpatient Services   Home Health Skilled Nursing   CM Interventions   Follow up appointment scheduled?(For PNA, COPD, MI) Yes  (See AVS)   Multidisciplinary rounds/family meeting before d/c? Yes     Pt and family agreeable to discharge to home  Navigator provided cardiac teaching post op  AVS updated  Transport via Connor, [place] [phone]) [phone]     Olivia Sines BSN, RN  Nurse Case Manager 1  Case Management Department  [email or web address]  Phone: [phone]

## 2024-04-24 NOTE — Discharge Summary (Signed)
 [facility]  Internal Medicine Hospitalists  Discharge Summary          Date of Admission: 04/23/2024  Date of Discharge: No discharge date for patient encounter.    Discharge Diagnoses:   Complete heart block, dual chamber medtronic PPM 04/23/24  Hyponatremia, stable at 129  HLD  GERD    Hospital Course:     Hospital Course:   Patient is an 89 year old female with a history of HLD, ILD, NHL in remission, GERD, lumbar stenosis, and osteoporosis, presents with a complaint of shortness of breath and chest heaviness, found to be in complete heart block. Patient had successful PPM on 04/23/2024. Patient was monitored overnight without issues. Device was interrogated and EP cleared patient to return home. Patient will follow up with EP in two weeks for incision check.      Recommend repeat BMP at PCP office in the next 10 days.      Discharge Day Exam:   Temp:  [97.2 F (36.2 C)-99.7 F (37.6 C)] 98 F (36.7 C)  Heart Rate:  [26-104] 86  Resp Rate:  [14-28] 17  BP: (126-183)/(60-105) 148/65    Physical Exam  Constitutional:       Appearance: Normal appearance.   Cardiovascular:      Rate and Rhythm: Normal rate and regular rhythm.      Heart sounds: Normal heart sounds.   Pulmonary:      Effort: Pulmonary effort is normal.      Breath sounds: Normal breath sounds.   Abdominal:      Palpations: Abdomen is soft.   Skin:     General: Skin is warm and dry.   Neurological:      General: No focal deficit present.      Mental Status: She is alert and oriented to person, place, and time.   Psychiatric:         Mood and Affect: Mood normal.         Behavior: Behavior normal.           Pertinent Labs:   CBC:   Recent Labs   Lab 04/24/24  0205 04/23/24  1226   Sodium 129* 129*   Potassium 4.3 4.6   Chloride 102 101   CO2 16* 18   BUN 20 23*   Creatinine 0.7 0.8   Calcium  8.7 8.9   Glucose 86 101*     BMP:   Recent Labs   Lab 04/24/24  0205 04/23/24  1226   Sodium 129* 129*   Potassium 4.3 4.6   Chloride 102 101    CO2 16* 18   BUN 20 23*   Creatinine 0.7 0.8   Calcium  8.7 8.9   Glucose 86 101*     LFT:   Recent Labs   Lab 04/23/24  1226   Albumin 3.7   Protein, Total 6.8   Bilirubin, Total 0.7   Alkaline Phosphatase 75   ALT 24   AST (SGOT) 57*     Coags:     Cardiac:   Recent Labs   Lab 04/23/24  1226   hs Troponin 7.7           Radiology and Procedures:   Radiology: all results from this admission  X-ray chest AP portable  Result Date: 04/23/2024   New pacemaker. No pneumothorax. Pulmonary fibrosis. [name], MD 04/23/2024 7:50 PM    XR Chest  AP Portable  Result Date: 04/23/2024  Hyperaeration of the lungs.  Subpleural fibrotic changes of the lung bases. [name], DO 04/23/2024 1:04 PM   Surgery: all results from this admission  Procedure(s):  PM Implant Dual (N/A)    Discharge Medications and Documented Allergies:        Discharge Medication List        Taking      acetaminophen  500 MG tablet  Dose: 1,000 mg  Commonly known as: TYLENOL   Take 2 tablets (1,000 mg) by mouth every 6 (six) hours as needed for Pain     atorvastatin  10 MG tablet  Commonly known as: LIPITOR  TAKE ONE TABLET BY MOUTH once EVERY NIGHT.     RABEprazole 20 MG tablet  Dose: 20 mg  Commonly known as: ACIPHEX  Take 1 tablet (20 mg) by mouth once daily     vitamin B-12 1000 MCG tablet  Dose: 1,000 mcg  Commonly known as: CYANOCOBALAMIN  Take 1 tablet (1,000 mcg) by mouth once daily              Allergies[1]         Disposition:     Discharge Disposition: Home with family    Discharge Code Status: Full Code    Patient Emergency Contact:  Extended Emergency Contact Information  Primary Emergency Contact: Avallone,Allan  Address: [address]           [place], [place] [address] [place]  Home Phone: [phone]  Mobile Phone: [phone]  Relation: Spouse    Discharge Instructions:     Patient Instructions: (See AVS for full details)    Most Recent Diet Order:  Orders Placed This Encounter   Procedures    Adult diet  Regular         Outpatient Follow-Up Plan:     Instructions for PCP:    Appointments:   Follow-up Information       Tilford Arthea DASEN, MD Follow up.    Specialties: Clinical Cardiac Electrophysiology, Cardiology  Contact information:  [address]  [address] [address]  [phone]               Jannetta Barlow, MD Follow up.    Specialties: Family Medicine, Geriatric Medicine  Contact information:  [address]  [address] [place] [address]  [phone]                             Pending Labs, Microbiology, and Pathology:  Unresulted Labs       None            Attestations and Signatures:     Minutes spent coordinating discharge and reviewing discharge plan: 45 minutes                   [1]   Allergies  Allergen Reactions    Aspirin Other (See Comments)     Oral and stomach irritation if taken daily    Codeine Nausea Only    Salicylates

## 2024-04-24 NOTE — Progress Notes (Signed)
 04/24/24 1123   Patient Type   Within 30 Days of Previous Admission? No   Healthcare Decisions   Interviewed: Patient;Family   Interviewee Contact Information: Zayneb, [place])  [phone] [facility])   Orientation/Decision Making Abilities of Patient Alert and Oriented x3, able to make decisions   Advance Directive Patient has advance directive, copy not in chart   Advance Directive not in Chart Copy requested from family/decision maker   Prior to admission   Prior level of function Independent with ADLs;Ambulates independently   Type of Residence Private residence   Home Layout Able to live on main level with bedroom/bathroom;Multi-level;Bed/bath upstairs  (Split level)   Have running water , electricity, heat, etc? Yes   Living Arrangements Spouse/significant other   How do you get to your MD appointments? spouse   How do you get your groceries? spouse   Who fixes your meals? spouse   Who does your laundry? spouse   Who picks up your prescriptions? spouse   Dressing Independent   Grooming Independent   Feeding Independent   Bathing Independent   Toileting Independent   DME Currently at Home ADL- Soil [employer], [employer]   Discharge Planning   Support Systems Spouse/significant other;Children   Expected Discharge Disposition Home   Anticipated [place] plan discussed with: Same as interviewed   Mode of transportation: Private car (family member)   Does the patient have perscription coverage? Yes   Financial Resource Strain   How hard is it for you to pay for the very basics like food, housing, medical care, and heating? Not hard   Housing Stability   In the last 12 months, was there a time when you were not able to pay the mortgage or rent on time? N   In the past 12 months, how many times have you moved where you were living? 0   At any time in the past 12 months, were you homeless or living in a shelter (including now)? N   Transportation Needs   In the past 12 months, has lack of transportation kept you  from medical appointments or from getting medications? no   In the past 12 months, has lack of transportation kept you from meetings, work, or from getting things needed for daily living? No   Consults/Providers   PT Evaluation Needed 1   OT Evalulation Needed 1   SLP Evaluation Needed 2   Correct PCP listed in Epic? Yes   Family and PCP   PCP on file was verified as the current PCP? Yes   In case you are admitted, transferred or discharged, would like family notified? Yes   Name of family member to be notified Angeleen, Horney (Son)  [phone] [facility])   In case you are admitted, transferred or discharged, would like your PCP notified? Yes     Completed assessment over the phone with: patient's spouse Dasie   Verified demographics, insurance, and PCP.     Patient is independent with ADLs, ambulates independently.   Lives with spouse and has support at home.     DME as listed above.   No recent HHC, AR, SNF.     Patient's son will be able to provide transportation home at discharge.     Case Management location: remote    Annmarie Ruth, BSN RN  RN Case Manager I  Care Management  [facility]  [address]  [place], [place] [address]  Phone: [phone]

## 2024-04-24 NOTE — Discharge Instr - AVS First Page (Addendum)
 [facility]  [phone]  [email or web address]       Post-implant discharge instructions     Incision Care:  Your incision will be closed with surgical glue.  You may shower when you get home.  Pat incision with a soap washcloth and pat dry.   No tub baths or submersion of incision in water  until it is fully healed.  Do not place any products, to include antibiotic ointment, on incision.  Do not scrub incision or pull adhesive off of your incision.  Check incision daily.  If you notice redness, swelling, warmth, drainage, pain, bleeding, fever, or chills please call our office.  Bruising from the procedure may migrate down the chest and arm during the recovery .  You may take Tylenol  as needed for incision discomfort.    Arm Restrictions (apply for 1 MONTH after your procedure):  Do not lift greater than 5 pounds with device side arm.    Do not lift arm above shoulder height or behind your back.  You should move arm up to shoulder height to avoid mobility problems in your shoulder.  These restrictions are in placed to prevent dislodgement of your leads.    Activity restrictions:  Typically you may drive after 1 week.  However, if you've had cardiac arrest or passed out this may be longer.  Please ask your doctor for further clarification.   You may return to your desk job after 48 hours, but must follow the above arm restrictions.  If you need a work excuse please ask for this prior to discharge.   Physical jobs should be discussed with your doctor.   You may travel after your site check (approximately 2 weeks post-procedure).  We recommend that you take your device card with you.  It is safe for you to go through advanced imaging screening at airports.  Your device may set off metal detector alarms, but will not be harmed by the metal detectors.     Long-term Management and Restrictions:  In order to avoid problems with your device please avoid the following exercises: Full push-ups, pull-ups, bench  press, shoulder press, rowing machines, and freestyle swimming.  It is likely that your device is MRI compatible, but you must wait at least 6 weeks after implantation.  If you need a MRI our office will fill out a form to confirm your device and leads are compatible.    Surgery - if you have a surgery that is to involve electrocautery (as most are), the surgical team may need to place a magnet over the device or your device may need to be reprogrammed.  Inform your surgeon that you have a device, and they will contact us  to fill out a form instructing them on how your device is to be treated.    Symptoms to report:  Please call our office if you experience chest pain, pain when taking a deep breath, shortness of breath, fever, chills, dizziness, or passing out.  If you notice redness, swelling, warmth, drainage, pain, or bleeding from your incision site please call our office.        Home Health Discharge Information    Your doctor has ordered Skilled Nursing, Physical Therapy, and Occupational Therapy in-home service(s) for you while you recuperate at home, to assist you in the transition from hospital to home.    The agency that you or your representative chose to provide the service:  Name of Home Health Agency Placement: [facility]]  Phone: [phone]    The above services were set up by:  Clent HERO, RN (Post Acute Care Coordinator)     IF YOU HAVE NOT HEARD FROM YOUR HOME HEALTH AGENCY WITHIN 24-48 HOURS AFTER DISCHARGE PLEASE CALL YOUR AGENCY TO ARRANGE A TIME FOR YOUR FIRST VISIT. FOR ANY SCHEDULING CONCERNS OR QUESTIONS RELATED TO HOME HEALTH, SUCH AS TIME OR DATE PLEASE CONTACT YOUR HOME HEALTH AGENCY AT THE NUMBER LISTED ABOVE.

## 2024-04-24 NOTE — PT Eval Note (Signed)
 Physical Therapy Evaluation  Maureen Vasquez      Post Acute Care Therapy Recommendations:     Discharge Recommendations:  Home with home health PT, Home with supervision  D/C Milestones: continue gait/stair training Anticipate achievement in 1 to 2 sessions    DME needs IF patient is discharging home: Patient already has needed equipment    Therapy discharge recommendations may change with patient status.  Please refer to most recent note for up-to-date recommendations.    Assessment:   Significant Findings: none    Maureen Vasquez is a 89 y.o. female admitted 04/23/2024.  Patient presents with decreased strength, decreased activity tolerance, impaired balance and gait deficits. Reviewed pacemaker precautions, requires verbal cues to maintain with bed mobility and transfers. Pt is able to ambulate 20 ft with rolling walker cga and ascend/descend steps cga. Pt would benefit from continued acute skilled physical therapy services to address these deficits and maximize independence with functional mobility. Recommend D/C to home with supervision and HHPT.      Impairments: Assessment: Decreased UE strength;Decreased LE strength;Decreased safety/judgement during functional mobility;Decreased endurance/activity tolerance;Impaired coordination;Decreased functional mobility;Decreased balance;Gait impairment.     Therapy Diagnosis: Impaired functional mobility     Rehabilitation Potential: Prognosis: Fair;With continued PT status post acute discharge    Treatment Activities: Physical therapy evaluation, bed mobility, transfer training, gait training, stair training, pt education and discharge planning     Educated the patient to role of physical therapy, plan of care, goals of therapy and HEP, safety with mobility and ADLs, energy conservation techniques, discharge instructions, home safety.    Plan:   Treatment/Interventions: Exercise, Gait training, Neuromuscular re-education, Functional transfer training, LE  strengthening/ROM, Endurance training, Patient/family training, Equipment eval/education, Bed mobility, Continued evaluation     PT Frequency: 2-3x/wk   Risks/Benefits/POC Discussed with Pt/Family: With patient/family       [facility]  Bed: FI151/FI151-01     Precautions and Contraindications:   Other Precautions: pacemaker precautions    Consult received for Maureen Vasquez for PT Evaluation and Treatment.  Patient's medical condition is appropriate for Physical therapy intervention at this time.    Medical Diagnosis: Complete heart block (CMS/HCC) [I44.2]  CHB (complete heart block) (CMS/HCC) [I44.2]      History of Present Illness:   [name] is a 89 y.o. female admitted on 04/23/2024 with history of HLD, ILD, NHL in remission, GERD, lumbar stenosis, and osteoporosis, presents with a complaint of shortness of breath and chest heaviness, found to be in complete heart block. Patient had successful PPM on 04/23/2024, per chart review.     Past Medical/Surgical History:  Past Medical History:   Diagnosis Date    Arthritis     Rt hand index finger    Back pain     Basal cell carcinoma     Ear, nose and throat disorder     sinus issues    Encounter for blood transfusion 11/14/2013    Gastroesophageal reflux disease     H/O acute pancreatitis 07/15/1989    Low back pain 11/14/2012    3 slipped discs    Lymphona, mantle cell, inguinal region/lower limb (CMS/HCC)     Malignant neoplasm (CMS/HCC) 11/14/2012    Non Hodgkins lymphoma/s/p chemo    NHL (non-Hodgkin's lymphoma) (CMS/HCC) 08/14/2012    w/ Splenic Nodules    Post-operative nausea and vomiting     Cardiac arrest 34 yrs ago with ruptured appendix/also in 2000 w/endoscopy  Seasonal allergic rhinitis         X-Rays/Tests/Labs:  X-ray chest AP portable  Result Date: 04/23/2024   New pacemaker. No pneumothorax. Pulmonary fibrosis. [name], MD 04/23/2024 7:50 PM    XR Chest  AP Portable  Result Date: 04/23/2024  Hyperaeration of  the lungs. Subpleural fibrotic changes of the lung bases. [name], DO 04/23/2024 1:04 PM     Social History:   Prior Level of Function:  Prior level of function: Independent with ADLs, Ambulates independently  Baseline Activity Level: Community ambulation  Driving: does not drive  DME Currently at Home: ADL- Paediatric nurse, Environmental consultant, [employer] Living Arrangements:  Living Arrangements: Spouse/significant other  Type of Home: House  Home Layout: Able to live on main level with bedroom/bathroom, Multi-level, Bed/bath upstairs (Split level)  Bathroom Shower/Tub: Pension scheme manager: Midwife: Paediatric nurse  DME Currently at Home: ADL- Paediatric nurse, Environmental consultant, [employer]    Subjective: Pt states, What about laundry?   Patient is agreeable to participation in the therapy session. Nursing clears patient for therapy.     Patient Goal: None stated    Pain Assessment  Pain Assessment: No/denies pain     Objective:   Observation of Patient/Vital Signs:  Patient is in bed with telemetry in place.  Pt wore mask during therapy session:No      Observation of Patient/Vital signs:       Cognition/Neuro Status  Arousal/Alertness: Appropriate responses to stimuli  Attention Span: Appears intact  Orientation Level: Oriented X4  Memory: Decreased recall of precautions  Following Commands: Follows all commands and directions without difficulty  Safety Awareness: independent  Insights: Educated in Engineer, building services  Problem Solving: Assistance required to identify errors made;minimal assistance  Motor Planning: intact  Coordination: intact    Musculoskeletal Examination:  Gross ROM  Right Upper Extremity ROM: within functional limits  Left Upper Extremity ROM: within functional limits  Right Lower Extremity ROM: within functional limits  Left Lower Extremity ROM: within functional limits    Gross Strength  Right Upper Extremity Strength: within functional limits  Left Upper Extremity Strength:  within functional limits  Right Lower Extremity Strength: 4-/5  Left Lower Extremity Strength: 4-/5         Functional Mobility:  Supine to Sit: Contact Guard Assist (verbal cues to maintain precautions)  Sit to Stand: Contact Guard Assist (verbal cues to maintain precautions)  Stand to Sit: Contact Guard Assist (verbal cues to maintain precautions)         Ambulation:  PMP - Progressive Mobility Protocol   PMP Activity: Step 6 - Walks in Room  Distance Walked (ft) (Step 6,7): 20 Feet     Ambulation: Contact Guard Assist;with front-wheeled walker  Pattern: decreased cadence;decreased step length  Stair Management: Contact Guard Assist;one rail R;step to pattern  Number of Stairs: 4     Balance:  Sitting - Static: Good  Sitting - Dynamic: Good  Standing - Static: Fair  Standing - Dynamic: Fair         Participation and Activity Tolerance:  Participation Effort: good  Endurance: Tolerates < 10 min exercise, no significant change in vital signs      Patient left with call bell within reach, all needs met, SCDs not in place, fall mat in place, bed alarm on, chair alarm n/a and all questions answered. RN notified of session outcome and patient response.       Goals:  Goals  Goal Formulation: With patient/family  Time for Goal Acheivement: 3 visits  Goals: Select goal  Pt Will Go Supine To Sit: with supervision, to maximize functional mobility and independence  Pt Will Perform Sit To Supine: with supervision, to maximize functional mobility and independence  Pt Will Perform Sit to Stand: with supervision, to maximize functional mobility and independence  Pt Will Ambulate: > 200 feet, with rolling walker, with supervision, to maximize functional mobility and independence  Pt Will Go Up / Down Stairs: 6-10 stairs, with supervision, With rail, to maximize functional mobility and independence       PPE worn during session: gloves  Tech present: No  PPE worn by tech: N/A    Time of treatment:   PT Received On: 04/24/24  Start  Time: 1055  Stop Time: 1120  Time Calculation (min): 25 min     Rocky Ada, DPT, pager [phone]

## 2024-04-24 NOTE — OT Eval Note (Signed)
 Occupational Therapy Eval [name]        Post Acute Care Therapy Recommendations:     Discharge Recommendations:  Home with supervision, Home with home health OT    DME needs IF patient is discharging home: No additional equipment/DME recommended at this time, Patient already has needed equipment    Therapy discharge recommendations may change with patient status.  Please refer to most recent note for up-to-date recommendations.     Assessment:   Significant Findings: None     CYNDI MONTEJANO is a 89 y.o. female admitted 04/23/2024 s/p PPM placement. Upon OT arrival patient in bathroom agreeable to OT evaluation. Therapist provided education on PPM precautions & patient receptive to education. Patient requires cueing to maintain precautions. Patient completes STS with CGA to RW. Patient completes functional transfers with CGA and RW. Vitals stable throughout OT session. Patient requires supervision-mod A for ADLs. Patient presents with decreased balance and decreased strength/endurance, negatively impacting ability to complete ADLs/functional transfers at baseline level. Patient will benefit from skilled acute OT services to increase safety/independence with ADLs and functional transfers/mobility.     Therapy Diagnosis:  decreased ADL performance     Rehabilitation Potential:  good for stated goals     Treatment Activities: OT Eval, ADL retraining, functional transfer training, modified bed mobility training, education on energy conservation/fall prevention techniques  Educated the patient to role of occupational therapy, plan of care, goals of therapy and HEP, safety with mobility and ADLs, energy conservation techniques, pursed lip breathing, pacemaker precautions, discharge instructions, home safety.    Plan:   OT Frequency Recommended: 2-3x/wk     Treatment/Interventions: ADL retraining, Neuromuscular re-education, therapeutic activity, therapeutic exercise, UE strengthening/AROM, endurance training,   patient/family training, DME recommendation, compensatory technique education, balance training, functional transfer training, education on energy conservation/fall prevention techniques,       Risks/benefits/POC discussed: yes     [facility]  Bed: FI151/FI151-01        Precautions and Contraindications:   Falls   PPM precautions     Consult received for [name] for OT Evaluation and Treatment.  Patient's medical condition is appropriate for Occupational Therapy intervention at this time.    History of Present Illness:    DEMITRA DANLEY is a 89 y.o. female admitted on 04/23/2024 with  history of HLD, ILD, NHL in remission, GERD, lumbar stenosis, and osteoporosis, presents with a complaint of shortness of breath and chest heaviness this morning. Per patient, she has had these symptoms for five days. Also complains of dizziness, lightheadedness, nausea, and weakness. She denies chest pain, vomiting, abdominal pain, or dysuria.         Procedure(s):  PM Implant Dual    1 Day Post-Op  -------------------        Admitting Diagnosis: Complete heart block (CMS/HCC) [I44.2]  CHB (complete heart block) (CMS/HCC) [I44.2]    Past Medical/Surgical History:  Medical History[1]  Past Surgical History[2]      Imaging/Tests/Labs:  X-ray chest AP portable  Result Date: 04/23/2024   New pacemaker. No pneumothorax. Pulmonary fibrosis. [name], MD 04/23/2024 7:50 PM    XR Chest  AP Portable  Result Date: 04/23/2024  Hyperaeration of the lungs. Subpleural fibrotic changes of the lung bases. [name], DO 04/23/2024 1:04 PM        Social History: per patient    Prior Level of Function:   Prior level of function: Independent with ADLs, Ambulates independently  Baseline Activity Level: Community ambulation  Ambulated 100 feet or more prior to admission: Yes  Driving: does not drive  DME Currently at Home: ADL- Paediatric nurse, Environmental consultant, [employer] Living Arrangements:  Living Arrangements:  Spouse/significant other  Type of Home: House  Home Layout: Able to live on main level with bedroom/bathroom, Stairs to enter with rails (add number in comment) (7)  Bathroom Shower/Tub: Pension scheme manager: Midwife: Paediatric nurse  DME Currently at Home: ADL- Paediatric nurse, Environmental consultant, [employer]    Subjective: How will I do the laundry?     Patient is agreeable to participation in the therapy session. Nursing clears patient for therapy.     Patient Goal: to be independent and go home   Pain:   Scale: no c/o of pain  Location:   Intervention:     Objective:   Patient is in bathroom with telemetry and peripheral IV in place.  Pt wore mask during therapy session:No      Cognitive Status and Neuro Exam:  A&Ox4. Pt able to discuss prior level of independence, medical history and home set up. Patient able to follow all commands without difficulty. Patient requires min cueing for safety. Patient pleasant, calm & cooperative.       Musculoskeletal Examination  RUE ROM: WFL  LUE ROM: Within PPM precautions  RLE ROM: grossly assessed WFL  LLE ROM: grossly assessed WFL    RUE Strength: WFL  LUE Strength: NT PPM precautions  RLE Strength: grossly assessed WFL  LLE Strength: grossly assessed WFL      Sensory/Oculomotor Examination  Auditory: intact, able to hear therapist,  Tactile: light touch BUE intact  Vision:  no changes, WFL      Activities of Daily Living  Eating: hand to mouth, independent  Grooming: SBA at sink  Bathing: educated on ECT & pacing & shower chair DME  UE Dressing: min A  LE Dressing: mod A  Toileting: min A    Functional Mobility:  Supine to Sit: CGA  Sit to Stand: CGA to RW  Transfers: CGA with RW    PMP Activity: Step 4 - Dangle at Bedside;Step 5 - Chair;Step 6 - Walks in Room      Balance  Static Sitting: good  Dynamic Sitting: good  Static Standing: good-  Dynamic Standing: good-    Participation and Activity Tolerance  Participation Effort: good  Endurance:  fair    Patient left with call bell within reach, all needs met, SCDs not in use as found, fall mat placed, bed alarm ON, chair alarm NA and all questions answered. RN notified of session outcome and patient response.       Goals:  Time For Goal Achievement: 3 visits  ADL Goals  Patient will dress upper body: Supervision  Patient will dress lower body: Supervision  Patient will toilet: Supervision  Mobility and Transfer Goals  Pt will perform functional transfers: Supervision        Executive Fucntion Goals  Pt will follow pacemaker precautions: with supervision, to increase ability to complete ADLs              PPE worn during session: gloves  Tech present: no  PPE worn by tech: N/A      Lum Greet, OTR/L   Pager [phone]         Time of treatment:   OT Received On: 04/24/24  Start Time: 1035  Stop Time: 1100  Time Calculation (min): 25 min       [1]   Past Medical History:  Diagnosis Date    Arthritis     Rt hand index finger    Back pain     Basal cell carcinoma     Ear, nose and throat disorder     sinus issues    Encounter for blood transfusion 11/14/2013    Gastroesophageal reflux disease     H/O acute pancreatitis 07/15/1989    Low back pain 11/14/2012    3 slipped discs    Lymphona, mantle cell, inguinal region/lower limb (CMS/HCC)     Malignant neoplasm (CMS/HCC) 11/14/2012    Non Hodgkins lymphoma/s/p chemo    NHL (non-Hodgkin's lymphoma) (CMS/HCC) 08/14/2012    w/ Splenic Nodules    Post-operative nausea and vomiting     Cardiac arrest 34 yrs ago with ruptured appendix/also in 2000 w/endoscopy    Seasonal allergic rhinitis    [2]   Past Surgical History:  Procedure Laterality Date    APPENDECTOMY (OPEN)      Age of 70    EXCISION, SQUAMOUS CELL      Right hand/ 2000    EXTRACTION, CATARACT, PHACO, IOL Left 04/07/2016    Procedure: EXTRACTION, CATARACT, PHACO, IOL;  Surgeon: Viviana Vick GRADE, MD;  Location: [facility];  Service: Ophthalmology;  Laterality: Left;  LEFT PHACO W/IOL    INSERTION,  CATHETER, CENTRAL VENOUS, TUNNELED, WITH SUBCUTANEOUS PORT      PM IMPLANT DUAL N/A 04/23/2024    Procedure: PM Implant Dual;  Surgeon: Tilford Arthea DASEN, MD;  Location: [facility];  Service: Cardiovascular;  Laterality: N/A;

## 2024-04-24 NOTE — Plan of Care (Signed)
[  x] After Visit Summary (AVS) reviewed with charge RN   -RN reviewed L arm limb restriction   -MD Chauhri during rounds in AM educated pt and family to take BP cuff daily and bring findings to follow up appointment  [x]  AVS reviewed with patient/family  [x]  Interpreter used? (Delete if N/A)  [x]  IV out, telemetry monitor removed    New prescriptions filled: - no new perscriptions  Patient discharged to:  [x]  [facility] Lobby []  Discharge Lounge []  Transport scheduled  Patient left with all belongings and the following:  [x]  Discharge paperwork  [x]  Any devices/device cards     Cardiac symptoms: denies  Diet: regular  Progressive Mobility:   [x]  Walks in room   Level of Assistance: []  Standby assist [x]  +1 Assist     Nursing Plan:   -pt/ot eval  -discharge 6/11  -am PPM interrogation     Problem: Pain interferes with ability to perform ADL  Goal: Pain at adequate level as identified by patient  Outcome: Adequate for Discharge     Problem: Side Effects from Pain Analgesia  Goal: Patient will experience minimal side effects of analgesic therapy  Outcome: Adequate for Discharge     Problem: Compromised Activity/Mobility  Goal: Activity/Mobility Interventions  Outcome: Adequate for Discharge     Problem: Moderate/High Fall Risk Score >5  Goal: Patient will remain free of falls  Outcome: Adequate for Discharge     Problem: Hemodynamic Status: Cardiac  Goal: Stable vital signs and fluid balance  Outcome: Adequate for Discharge     Problem: Inadequate Tissue Perfusion  Goal: Adequate tissue perfusion will be maintained  Outcome: Adequate for Discharge     Problem: Ineffective Gas Exchange  Goal: Effective breathing pattern  Outcome: Adequate for Discharge

## 2024-04-26 ENCOUNTER — Telehealth (INDEPENDENT_AMBULATORY_CARE_PROVIDER_SITE_OTHER): Payer: Self-pay | Admitting: Family Medicine

## 2024-04-26 NOTE — Telephone Encounter (Signed)
 PT's Husband called to inform PCP that his wife has been in the hospital via ER, and had a pacemaker put in. PT has been released as of 04/24/24. The will wait for her to rest prior to scheduling a Hospital Visit if that's what the PCP requests. Thank you

## 2024-05-06 ENCOUNTER — Encounter (INDEPENDENT_AMBULATORY_CARE_PROVIDER_SITE_OTHER): Payer: Self-pay | Admitting: Family Medicine

## 2024-05-07 ENCOUNTER — Ambulatory Visit (INDEPENDENT_AMBULATORY_CARE_PROVIDER_SITE_OTHER): Admitting: Physician Assistant

## 2024-05-07 VITALS — BP 135/70 | HR 78

## 2024-05-07 DIAGNOSIS — I442 Atrioventricular block, complete: Secondary | ICD-10-CM

## 2024-05-07 DIAGNOSIS — Z950 Presence of cardiac pacemaker: Secondary | ICD-10-CM

## 2024-05-07 NOTE — Progress Notes (Signed)
 [facility]  ([phone]) [phone]  [email or web address]       I had the pleasure of seeing Maureen Vasquez today for arrhythmia and device management.    [name] is a 89 y.o. female with a past medical history significant for arthritis,  non-Hodgkin's lymphoma, HLD, and syncope who presents with CHB. She is s/p dual chamber PPM (LBBaP lead) 6/10/205.    At today's visit [name] is doing well.  She reports some clear fluid drainage surrounding her pacemaker.  She denies any pain at the incision site.  She denies any fevers or chills.  Denies any bleeding.  She states that overall her symptoms have significantly improved since the pacemaker is implanted.  She has more energy and is no longer experiencing lightheadedness, dizziness, near-syncope or fatigue.  She denies chest pain or pressure, shortness of breath or palpitations.         She does participate in remote monitoring of implanted cardiac device.  Confirmed patient is active in [facility].    Cardiographics:  Echo planned to be done as an outpt.  She will call to schedule     ECG: AsVp, small RsR', LVAT    DEVICE INTERROGATION:  PPM Interrogation    Device type Dual Chamber PPM   Manufacturer [employer]onic   Model Azure   Implant date 05/23/2024   Indication  CHB     Presenting rhythm:  AsVp  Underlying rhythm:  SR with CHB    Leads  RA 0.5 V @ 0.4 ms 1.9 mV 380 ohms   RV 0.75 V @ 0.4 ms 6.1 mV 570 ohms     Diagnostic Data  A-paced 0.7 %   V-paced 97.3 %     High rate episodes  A-episodes    V-episodes      Battery status:  satisfactory     Final Parameters:  Mode  DDD    Lower-Upper Rate 60 - 120 bpm      Notes / Changes made:      Outputs remain in postoperative settings.            PMH:   Patient Active Problem List    Diagnosis Date Noted    Complete heart block (CMS/HCC) 04/23/2024    ILD (interstitial lung disease) (CMS/HCC) 12/28/2022    Cataract, nuclear sclerotic senile, left 04/07/2016    Nuclear sclerotic cataract of  left eye 03/28/2016    Neutropenia 02/27/2014    NHL (non-Hodgkin's lymphoma) (CMS/HCC) 02/27/2014    Generalized weakness 02/27/2014    Low back ache 11/16/2011        MEDICATIONS:     Current Medications[1]     SH: Social History[2]    REVIEW OF SYSTEMS: All other systems reviewed and negative except as above.    PHYSICAL EXAMINATION  General Appearance: A well-appearing female in no acute distress.   Vital Signs: BP 135/70 (BP Site: Left arm, Patient Position: Sitting, Cuff Size: Medium)   Pulse 78    Chest: Clear to auscultation bilaterally with good air movement and respiratory effort and no wheezes, rales, or rhonchi  L sided PM incision: Erythema and mild irritation which appears related to Dermabond.  Removed most of the Dermabond and the erythema improved within minutes.  Incision closed.  Drainage does not appear to be coming from the incision.  Cardiovascular: Normal S1 and physiologically split S2 without murmurs, gallops or rub. PMI of normal size and nondisplaced.   Extremities: Warm without edema.  All peripheral pulses are full and equal.      Assessment/Plan:   1. S/P placement of cardiac pacemaker  ECG 12 lead (Normal)      2. Complete heart block (CMS/HCC)        3. Cardiac pacemaker             Normal device function, reviewed with patient  Reviewed ongoing restrictions and remote monitoring with the patient and her husband.  Addressed several questions they had regarding the pacemaker.  Advised her to continue to monitor her incision for signs/symptoms of infection.  OV 46mo or sooner PRN      __________________________________________________  Jama Bucco PA-C  [facility]  Spectra  link [phone]  Office [phone]      Incident to service performed with physician present in the office in accordance with this patient's established plan of care.         [1]   Current Outpatient Medications   Medication Sig Dispense Refill    acetaminophen  (TYLENOL ) 500 MG tablet Take 2 tablets (1,000 mg) by  mouth every 6 (six) hours as needed for Pain      atorvastatin  (LIPITOR) 10 MG tablet TAKE ONE TABLET BY MOUTH once EVERY NIGHT. 90 tablet 0    RABEprazole (ACIPHEX) 20 MG tablet Take 1 tablet (20 mg) by mouth once daily      vitamin B-12 (CYANOCOBALAMIN) 1000 MCG tablet Take 1 tablet (1,000 mcg) by mouth once daily       No current facility-administered medications for this visit.   [2]   Social History  Tobacco Use    Smoking status: Never    Smokeless tobacco: Never   Vaping Use    Vaping status: Never Used   Substance Use Topics    Alcohol use: Yes     Alcohol/week: 2.0 standard drinks of alcohol     Types: 2 Glasses of wine per week    Drug use: No

## 2024-05-09 ENCOUNTER — Encounter (INDEPENDENT_AMBULATORY_CARE_PROVIDER_SITE_OTHER): Admitting: Adult Health

## 2024-05-13 ENCOUNTER — Ambulatory Visit

## 2024-05-13 ENCOUNTER — Encounter (INDEPENDENT_AMBULATORY_CARE_PROVIDER_SITE_OTHER): Payer: Self-pay | Admitting: Family Medicine

## 2024-05-13 LAB — ECG 12-LEAD
Atrial Rate: 76 {beats}/min
P Axis: 69 degrees
P-R Interval: 182 ms
Q-T Interval: 440 ms
QRS Duration: 126 ms
QTC Calculation (Bezet): 495 ms
R Axis: -66 degrees
T Axis: 96 degrees
Ventricular Rate: 76 {beats}/min

## 2024-05-31 ENCOUNTER — Ambulatory Visit
Admission: RE | Admit: 2024-05-31 | Discharge: 2024-05-31 | Disposition: A | Discharge status: Home / Self Care | Source: Ambulatory Visit | Attending: Critical Care Medicine | Admitting: Critical Care Medicine

## 2024-05-31 DIAGNOSIS — R0602 Shortness of breath: Secondary | ICD-10-CM | POA: Insufficient documentation

## 2024-06-01 LAB — ECHO ADULT TTE COMPLETE
AV Area (Cont Eq VTI): 2.536
AV Mean Gradient: 2
AV Peak Velocity: 0.856
Ao Root Diameter (2D): 2.8
BP Mod LV Ejection Fraction: 51
IVS Diastolic Thickness (2D): 1.2
LA Dimension (2D): 4.1
LA Volume Index (BP A-L): 21.0013
LVID diastole (2D): 3.7
LVID systole (2D): 2.8
MV E/A: 0.3333
MV E/e' (Average): 7.4748
Mitral Valve Findings: NORMAL
Prox Ascending Aorta Diameter: 3
Pulmonary Valve Findings: NORMAL
RV Basal Diastolic Dimension: 2.1
RV Systolic Pressure: 20.64
TAPSE: 1.6
Tricuspid Valve Findings: NORMAL

## 2024-06-05 ENCOUNTER — Telehealth (INDEPENDENT_AMBULATORY_CARE_PROVIDER_SITE_OTHER): Payer: Self-pay

## 2024-06-05 NOTE — Telephone Encounter (Signed)
 Requestor Information:Dr. Alix Mas ordering pulmonologist  Records Needed: Echo   DOS: 05/31/24  Fax Number: [phone]  Phone Number: [phone]

## 2024-06-12 ENCOUNTER — Encounter (INDEPENDENT_AMBULATORY_CARE_PROVIDER_SITE_OTHER): Payer: Self-pay | Admitting: Family Medicine

## 2024-06-16 ENCOUNTER — Other Ambulatory Visit (INDEPENDENT_AMBULATORY_CARE_PROVIDER_SITE_OTHER): Payer: Self-pay | Admitting: Family Medicine

## 2024-07-30 ENCOUNTER — Telehealth (INDEPENDENT_AMBULATORY_CARE_PROVIDER_SITE_OTHER): Payer: Self-pay

## 2024-07-30 NOTE — Telephone Encounter (Signed)
 Pt husband calling. Pt wife had PPM inserted 6/10. Pt for last 2 weeks been having SOB, no energy. These were same sx prior to PPM. BP seems ok. PT and pt husband requesting a call back from nurse. Pt wants wife PPM interrogated. Something isn't going right

## 2024-07-30 NOTE — Telephone Encounter (Signed)
 Pt and husband calling.Pt reports last two weeks she has been feeling SOB and blurry vision but the worst has been today. Pt has no bedside monitor or apple watch or kardia to send tracing. Pt and husband instructed to take BP. Her BP 160/83. Pt states it has been normally in the 120s. Recheck 148/79. Pulse 75. Pt and husband demanding to be seen and check device

## 2024-07-30 NOTE — Telephone Encounter (Signed)
 Husband LM calling back. Front desk have spoken to him and scheduled her to see Dr. Diona at 3pm.

## 2024-07-31 ENCOUNTER — Ambulatory Visit (INDEPENDENT_AMBULATORY_CARE_PROVIDER_SITE_OTHER): Payer: Self-pay | Admitting: Internal Medicine

## 2024-07-31 ENCOUNTER — Other Ambulatory Visit (INDEPENDENT_AMBULATORY_CARE_PROVIDER_SITE_OTHER)

## 2024-07-31 ENCOUNTER — Ambulatory Visit
Admission: RE | Admit: 2024-07-31 | Discharge: 2024-07-31 | Disposition: A | Discharge status: Home / Self Care | Source: Ambulatory Visit | Attending: Internal Medicine | Admitting: Internal Medicine

## 2024-07-31 ENCOUNTER — Ambulatory Visit (INDEPENDENT_AMBULATORY_CARE_PROVIDER_SITE_OTHER): Admitting: Internal Medicine

## 2024-07-31 VITALS — BP 150/70 | HR 74

## 2024-07-31 DIAGNOSIS — R0602 Shortness of breath: Secondary | ICD-10-CM

## 2024-07-31 DIAGNOSIS — Z950 Presence of cardiac pacemaker: Secondary | ICD-10-CM

## 2024-07-31 LAB — LAB USE ONLY - CBC WITH DIFFERENTIAL
Absolute Basophils: 0.04 x10 3/uL (ref 0.00–0.08)
Absolute Eosinophils: 0.21 x10 3/uL (ref 0.00–0.44)
Absolute Immature Granulocytes: 0.04 x10 3/uL (ref 0.00–0.07)
Absolute Lymphocytes: 1.64 x10 3/uL (ref 0.42–3.22)
Absolute Monocytes: 1.12 x10 3/uL — ABNORMAL HIGH (ref 0.21–0.85)
Absolute Neutrophils: 6.09 x10 3/uL (ref 1.10–6.33)
Absolute nRBC: 0 x10 3/uL (ref <=0.00)
Basophils %: 0.4 %
Eosinophils %: 2.3 %
Hematocrit: 41.1 % (ref 34.7–43.7)
Hemoglobin: 13.7 g/dL (ref 11.4–14.8)
Immature Granulocytes %: 0.4 %
Lymphocytes %: 17.9 %
MCH: 28.6 pg (ref 25.1–33.5)
MCHC: 33.3 g/dL (ref 31.5–35.8)
MCV: 85.8 fL (ref 78.0–96.0)
MPV: 10.1 fL (ref 8.9–12.5)
Monocytes %: 12.3 %
Neutrophils %: 66.7 %
Platelet Count: 248 x10 3/uL (ref 142–346)
Preliminary Absolute Neutrophil Count: 6.09 x10 3/uL (ref 1.10–6.33)
RBC: 4.79 x10 6/uL (ref 3.90–5.10)
RDW: 13 % (ref 11–15)
WBC: 9.14 x10 3/uL (ref 3.10–9.50)
nRBC %: 0 /100{WBCs} (ref <=0.0)

## 2024-07-31 LAB — BASIC METABOLIC PANEL
Anion Gap: 12 (ref 5.0–15.0)
BUN: 15 mg/dL (ref 7–21)
CO2: 24 meq/L (ref 17–29)
Calcium: 8.9 mg/dL (ref 7.9–10.2)
Chloride: 99 meq/L (ref 99–111)
Creatinine: 0.6 mg/dL (ref 0.4–1.0)
GFR: >60 mL/min/1.73 m2 (ref >=60.0)
Glucose: 95 mg/dL (ref 70–100)
Hemolysis Index: 10 {index}
Potassium: 4.1 meq/L (ref 3.5–5.3)
Sodium: 135 meq/L (ref 135–145)

## 2024-07-31 NOTE — Progress Notes (Signed)
 [facility]  [phone]  [email or web address]       I had the pleasure of seeing Maureen Vasquez today for arrhythmia and device management.    [name] is a 89 y.o. female with a past medical history significant for arthritis,  non-Hodgkin's lymphoma, HLD, and syncope who presents with CHB. She is s/p dual chamber PPM (LBBaP lead) 6/10/205 and was seen in follow-up in [facility] on 05/08/24 during which he was doing well.    Since that time, she has endorsed poorer exertional capacity and says that something isn't right.    At today's visit [name] says that she feels like she has had progressive exertional intolerance that has worsened over the last several months.  She says that she has a a bit of a productive cough and headache, but her fatigue mirrors how she felt before she got her device. She denies chest pain or palpitations.       She does participate in remote monitoring of implanted cardiac device.  Confirmed patient is active in [facility].    Cardiographics:  Echo planned to be done as an outpt.  She will call to schedule     ECG: AsVp, small RsR', LVAT    DEVICE INTERROGATION:  PPM Interrogation    Device type Dual Chamber PPM   Manufacturer Medtronic   Model Azure   Implant date 05/23/2024   Indication  CHB     Presenting rhythm:  AsVp  Underlying rhythm:  SR with CHB, 1:1 conduction with extension of AV delay to 250 ms.    Leads  RA 0.5 V @ 0.4 ms 1.9 mV 456 ohms   RV 0.875 V @ 0.4 ms 8.3 mV 532 ohms     Diagnostic Data  A-paced 0.9 %   V-paced 99.6 %     High rate episodes  A-episodes <0.1 %   V-episodes      Battery status:  satisfactory 12.5 years     Final Parameters:  Mode  DDD    Lower-Upper Rate 60 - 120 bpm      Notes / Changes made:  AV delay extended from 180/150 to 240/180 ms to allow for intermittent intrinsic conduction.  Otherwise patient noted to have a reasonable heartrate range and does not require rate responsiveness to be  enabled.             PMH:   Patient Active Problem List    Diagnosis Date Noted    Complete heart block (CMS/HCC) 04/23/2024    ILD (interstitial lung disease) (CMS/HCC) 12/28/2022    Cataract, nuclear sclerotic senile, left 04/07/2016    Nuclear sclerotic cataract of left eye 03/28/2016    Neutropenia 02/27/2014    NHL (non-Hodgkin's lymphoma) (CMS/HCC) 02/27/2014    Generalized weakness 02/27/2014    Low back ache 11/16/2011        MEDICATIONS:     Current Medications[1]     SH: Social History[2]    REVIEW OF SYSTEMS: All other systems reviewed and negative except as above.    PHYSICAL EXAMINATION  General Appearance: A well-appearing female in no acute distress.   Vital Signs: BP 150/70 (BP Site: Right arm, Patient Position: Sitting, Cuff Size: Medium)   Pulse 74    Chest: Clear to auscultation bilaterally with good air movement and respiratory effort and no wheezes, rales, or rhonchi  L sided PM incision: Erythema and mild irritation which appears related to Dermabond.  Removed most  of the Dermabond and the erythema improved within minutes.  Incision closed.  Drainage does not appear to be coming from the incision.  Cardiovascular: Normal S1 and physiologically split S2 without murmurs, gallops or rub. PMI of normal size and nondisplaced.   Extremities: Warm without edema. All peripheral pulses are full and equal.      Assessment/Plan:   1. Shortness of breath  XR Chest 2 Views    NT-ProBNP    Basic Metabolic Panel    CBC with Differential (Order)    XR Chest 2 Views          Normal device function, reviewed with patient  Reviewed ongoing restrictions and remote monitoring with the patient and her husband.  Addressed several questions they had regarding the pacemaker.  We will initiate search for other etiologies driving her shortness of breath, which appears to be debilitating.  She had a recent ultrasound done following complaints regarding these issues, which showed no effusion and no structural findings.   Her symptoms have since worsened  We will involve her primary care team as well as general cardiology pending the results. I wonder if part of her symptoms arise from her higher blood pressures, but will refrain from prescribing anti-hypertensives (I.e. losartan) until I see her BMP and NT proBNP result.    ____________________  Fidela Cassis, MD PhD  July 31, 2024         [1]   Current Outpatient Medications   Medication Sig Dispense Refill    acetaminophen  (TYLENOL ) 500 MG tablet Take 2 tablets (1,000 mg) by mouth every 6 (six) hours as needed for Pain      atorvastatin  (LIPITOR) 10 MG tablet TAKE ONE TABLET BY MOUTH EVERY NIGHT. 90 tablet 0    RABEprazole (ACIPHEX) 20 MG tablet Take 1 tablet (20 mg) by mouth once daily      vitamin B-12 (CYANOCOBALAMIN) 1000 MCG tablet Take 1 tablet (1,000 mcg) by mouth once daily       No current facility-administered medications for this visit.   [2]   Social History  Tobacco Use    Smoking status: Never    Smokeless tobacco: Never   Vaping Use    Vaping status: Never Used   Substance Use Topics    Alcohol use: Yes     Alcohol/week: 2.0 standard drinks of alcohol     Types: 2 Glasses of wine per week    Drug use: No

## 2024-08-01 LAB — NT-PROBNP: NT-ProBNP: 1315.4 pg/mL — ABNORMAL HIGH (ref <=956.1)

## 2024-08-14 DEATH — deceased

## 2024-08-15 ENCOUNTER — Other Ambulatory Visit (INDEPENDENT_AMBULATORY_CARE_PROVIDER_SITE_OTHER): Payer: Self-pay | Admitting: Family Medicine

## 2024-08-21 ENCOUNTER — Encounter (INDEPENDENT_AMBULATORY_CARE_PROVIDER_SITE_OTHER): Payer: Self-pay | Admitting: Family Medicine

## 2024-08-21 ENCOUNTER — Ambulatory Visit (INDEPENDENT_AMBULATORY_CARE_PROVIDER_SITE_OTHER): Admitting: Family Medicine

## 2024-08-21 VITALS — BP 129/67 | HR 75 | Temp 97.2°F

## 2024-08-21 DIAGNOSIS — Z0000 Encounter for general adult medical examination without abnormal findings: Secondary | ICD-10-CM

## 2024-08-21 DIAGNOSIS — I672 Cerebral atherosclerosis: Secondary | ICD-10-CM

## 2024-08-21 DIAGNOSIS — R413 Other amnesia: Secondary | ICD-10-CM

## 2024-08-21 DIAGNOSIS — J849 Interstitial pulmonary disease, unspecified: Secondary | ICD-10-CM

## 2024-08-21 DIAGNOSIS — R0602 Shortness of breath: Secondary | ICD-10-CM

## 2024-08-21 DIAGNOSIS — Z950 Presence of cardiac pacemaker: Secondary | ICD-10-CM

## 2024-08-21 NOTE — Progress Notes (Addendum)
 [facility] - [facility]  Medicare Wellness Visit               Maureen Vasquez is a 89 y.o. female who presents today for the following Medicare Wellness Visit: Annual Wellness Visit - Subsequent      Health Risk Assessment   HPI: Ms. Denno has h/o HL, ILD (CT chest 12/2022), NHL s/p chemo (2014), CHB s/p PPM (04/2024), shingles (11/2020), acid reflux, osteoporosis, lumbar stenosis.  C/o worsening short term memory loss. She had MRI and MRA/MRV brain in 2022 which showed leukomalacia reflective of chronic small vessel ischemic change and atherosclerosis. Pt is requesting to repeat brain imaging to see progression.    During the past month, how would you rate your general health?:  Fair  Which of the following tasks can you do without assistance - drive or take the bus alone; shop for groceries or clothes; prepare your own meals; do your own housework/laundry; handle your own finances/pay bills; eat, bathe or get around your home?: Eat, bathe, dress or get around your home, Prepare your own meals, Do your own housework/laundry  Which of the following problems have you been bothered by in the past month - dizzy when standing up; problems using the phone; feeling tired or fatigued; moderate or severe body pain?: Feeling tired or fatigued  Do you exercise for about 20 minutes 3 or more days per week?:No  During the past month was someone available to help if you needed and wanted help?  For example, if you felt nervous, lonely, got sick and had to stay in bed, needed someone to talk to, needed help with daily chores or needed help just taking care of yourself.: Yes (Husband)  Do you always wear a seat belt?: Yes  Do you have any trouble taking medications the way you have been told to take them?: No  Have you been given any information that can help you with keeping track of your medications?: Yes  Do you have trouble paying for your medications?: No  Hospitalizations   Hospitalization within past year: Yes.  Diagnosis: PPM placed for CHB 6/2 Cecille Burow, OLIGURIA 025.     Screenings         04/23/2024 08/21/2024   Ambulatory Screenings   Falls Risk: Terrilee more than 2 times in past year  N   Falls Risk: Suffer any injuries?  N   Depression: PHQ2 Total Score 0    Depression: PHQ9 Total Score 0         Substance Use Disorder Screen:  Liba  reports that she has never smoked. She has never used smokeless tobacco. She reports current alcohol use of about 2.0 standard drinks of alcohol per week. She reports that she does not use drugs.      Functional Ability/Level of Safety   Falls Risk/Home Safety Assessment:  Have you been given any information that can help you with hazards in your house, such as scatter rugs, furniture, etc?: Yes  Do you feel unsteady when standing or walking?: No  Do you worry about falling?: No  Have you fallen two or more times in the past year?: No  Did you suffer any injuries from your falls in the past year?: No    Home Safety:   Clear pathways between rooms   and Proper lighting stairs/bathrooms/bedrooms      Hearing Assessment:  patient reports hearing slightly decreased      Visual Acuity:     If  no visual acuity exam above, the patient has declined the visual acuity exam portion of this encounter.  Up to date with Optometry/Ophthalmology per patient report    Exercise:   Light ( i.e. stretching or slow walking )    Diet:  Diet: - consumes a well balanced diet      Activities of Daily Living   ADL's  Bathing: Independent  Dressing: Independent  Mobility: Independent  Transfer: Independent  Eating: Independent  Toileting: Independent    IADL's  Phone: Independent  Housekeeping: Requires moderate assistance  Laundry: Requires moderate assistance  Transportation: Requires maximum assistance  Medications: Independent  Finances: Requires maximum assistance    ADL assistance:   Spouse    Social Activities/Engagement   Frequency of Communication with Friends and Family:  often    Frequency of Social  Gatherings with Friends and Family:   often    Advanced Care Planning   Discussion of Advance Directives:   Has an Scientist, water quality. A copy has not been provided. Requested to provide.         Exam   BP 129/67 (BP Site: Left arm, Patient Position: Sitting, Cuff Size: Medium)   Pulse 75   Temp 97.2 F (36.2 C) (Oral)   SpO2 99%   Physical Exam  Vitals reviewed.   Constitutional:       Appearance: Normal appearance.   Cardiovascular:      Rate and Rhythm: Normal rate and regular rhythm.   Pulmonary:      Effort: Pulmonary effort is normal.      Breath sounds: Normal breath sounds.   Abdominal:      Palpations: Abdomen is soft.      Tenderness: There is no abdominal tenderness. There is no guarding.   Musculoskeletal:      Right lower leg: No edema.      Left lower leg: No edema.   Neurological:      Mental Status: She is alert.   Psychiatric:         Mood and Affect: Mood normal.         Behavior: Behavior normal.       Evaluation of Cognitive Function   Mood/affect: Appropriate  Appearance:  alert, well appearing, and in no distress  Family member/caregiver input: Present- no concerns.      Mini-Cog Score:  1-2 recalled words and normal clock draw - negative for cognitive impairment        Assessment/Plan     Assessment & Plan  Medicare annual wellness visit, subsequent         SOB (shortness of breath) on exertion  Pt has sob on minimal exertion. Pro-BNP is improving after PPM placement. Sob is not 2/2 cardiogenic etiology. Cardiology notes reviewed.        ILD (interstitial lung disease) (CMS/HCC)  Sob is likely 2/2 progression in ILD.  Recommended to follow-up with her pulmonologist in the next 1 to 2 weeks.       S/P placement of cardiac pacemaker  As above.       Cerebral leukomalacia (CMS/HCC)  2/2 vascular atherosclerosis.  MRI ordered as requested.       Short-term memory loss  2/2 vascular atherosclerosis.  Mini cog 3/5.   Orders:    CT Head WO Contrast; Future        Education/Counseling:  During the  course of the visit, the patient was educated and counseled about appropriate screening and preventive services including:   Pneumococcal vaccine  Influenza vaccine  Td vaccine  Shingrix vaccine  COVID vaccine  Nutrition counseling  Physical activity/exercise counseling  Falls risk prevention and home safety      Falls Risk/Home Safety:  The following falls risk/home safety measures were discussed with the patient:  [x]  Home Environment - 1) clear floors, walkways, and stairs of items 2) Remove throw rugs or use non-skid rugs 3) Install grab bars in bathroom 4) Use of handrails with stairs 5) Proper lighting of hallways, bathroom, stairs, bedroom  [x]  Physical Activity - age appropriate activity/exercise 150 mins per week.  [x]  Nutrition - adequate hydration, limit soda, coffee, alcohol   [x]  Vision - yearly eye exam, keep glasses clean  [x]  Medications - side effect education      Self-Management:  Ability to Understand and Manage Health Issues:  The patient/caregiver has  good  understanding of diagnosis, treatment plan and options for care.  Personalized Prevention Plan   The patient was provided with a personalized prevention plan via   the Patient Instructions tab of this visit                                                                                                                                          History/Care Team   Patient Care Team:  Jannetta Barlow, MD as PCP - General (Family Medicine)  Ezzard Cherene SAUNDERS, MD  Angelene Cleaver, MD as Consulting Physician (Cardiology)  Barnetta Alix CROME, MD as Consulting Physician (Internal Medicine)  Smitty Jama KIDD, [place] as Physician Assistant (Physician Assistant)  Diona Genre, MD PhD as Consulting Physician (Clinical Cardiac Electrophysiology)  Medical, surgical, family history reviewed and updated during this encounter  Medication list updated and reconciled during this encounter    Additional Documentation                                                                                                                                                 Verbal consent obtained to record this visit when ambient technology is utilized.

## 2024-08-21 NOTE — Assessment & Plan Note (Addendum)
 Sob is likely 2/2 progression in ILD.  Recommended to follow-up with her pulmonologist in the next 1 to 2 weeks.

## 2024-08-21 NOTE — Patient Instructions (Signed)
 MEDICARE WELLNESS PERSONAL PREVENTION PLAN   As part of the Medicare Wellness portion of your visit today, we are providing you with this personalized preventative plan of care. The list below includes many common screening recommendations from the USPSTF ([place]  Preventive Services Task Force) but is not meant to be comprehensive. You may be eligible for other preventative services depending upon your personal risk factors.     Health Maintenance   Topic Date Due    Advance Directive on File  Never done    Medicare Annual Wellness Visit  12/29/2023    Influenza Vaccine  06/14/2024    COVID-19 Vaccine (7 - 2025-26 season) 07/15/2024    DEPRESSION SCREENING  04/23/2025    FALLS RISK ANNUAL  08/21/2025    Tetanus Ten-Year  12/27/2031    DXA Scan  Completed    Shingrix Vaccine 50+  Completed    Pneumonia Vaccine Age 37 Years and Older  Completed     Health Maintenance Topics with due status: Overdue       Topic Date Due    Advance Directive on File Never done    Medicare Annual Wellness Visit 12/29/2023    Influenza Vaccine 06/14/2024    COVID-19 Vaccine 07/15/2024      Immunization History   Administered Date(s) Administered    COVID-19 mRNA BIVALENT vaccine 12 years and above [employer]) 30 mcg/0.3 mL 09/13/2021    COVID-19 mRNA MONOVALENT vaccine PRIMARY SERIES 12 years and above [employer]) 30 mcg/0.3 mL (DILUTE BEFORE USE) 01/11/2020, 02/01/2020, 08/12/2020    COVID-19 mRNA MONOVALENT vaccine PRIMARY SERIES 12 years and above (Pf[employer]) 30 mcg/0.3 mL (DO NOT DILUTE) 03/05/2021    COVID-19 mRNA vaccine 12 years and above (PFIZER/COMIRNATY) 30 mcg/0.3 mL 09/20/2022    Influenza quadrivalent (AFLURIA/FLUARIX/FLULAVAL/FLUZONE), 6 months and older, 0.5 mL, preservative free 11/21/2017    Influenza quadrivalent (AFLURIA/FLUZONE), 6 months and older, multi-dose, 5 mL 09/22/2015    Influenza quadrivalent (FLUBOK) recombinant, 18 years and older, 0.5 mL preservative free 09/13/2021    Influenza quadrivalent high-dose  (FLUZONE HIGH-DOSE) 65 years and older, 0.7 mL, preservative free 09/17/2019, 10/02/2020, 08/24/2022    Influenza trivalent 2025-2026 inactivated vaccine 6 mos+ (AFLURIA/FLUZONE) MDV 5 mL 12/13/2012, 10/24/2014    Pneumococcal conjugate (PREVNAR 20) 20-valent, preservative free 12/26/2021    RSV vaccine (AREXVY), recombinant, RSVpreF3, adjuvant reconstituted, PF, 0.5 mL 08/24/2022    Tdap (tetanus, diphtheria reduced, acellular pertussis) (ADACEL/BOOSTRIX), adsorbed 12/26/2021    Zoster (SHINGRIX) vaccine, recombinant 04/22/2021, 08/11/2021        Your major risk factors:   Recommendations for improvement:      Colorectal Cancer Screening - All adults 45-75 yrs should undergo periodic colorectal cancer screening. The decision to screen for colorectal cancer in adults aged 24 to 1 years should be an individual one, taking into account your overall health and prior screening history.   Breast Cancer Screening - Women aged 40-61yrs should have mammograms every other year (please note that this recommendation may not be appropriate for every woman - your physician can answer specific questions you may have). The USPSTF concludes that the current evidence is insufficient to assess the balance of benefits and harms of screening mammography in women aged 30 years or older.  These recommendations do not apply to persons who have a genetic marker or syndrome associated with a high risk of breast cancer (eg, BRCA1 or BRCA2 genetic variation), a history of high-dose radiation therapy to the chest at a young age, or previous breast cancer  or a high-risk breast lesion on previous biopsies.   Cervical Cancer Screening - Women over 31 do not require pap smears as long as prior screening has been normal and are not otherwise at high risk for cervical cancer. The USPSTF recommends screening for cervical cancer every 3 years with cervical cytology alone in women aged 72 to 7 years. For women aged 69 to 58 years, the USPSTF  recommends screening every 3 years with cervical cytology alone, every 5 years with high-risk human papillomavirus (hrHPV) testing alone, or every 5 years with hrHPV testing in combination with cytology (cotesting).   Osteoporosis Screening -  The USPSTF recommends screening for osteoporosis with bone measurement testing to prevent osteoporotic fractures in women 65 years and older.  For postmenopausal women younger than 65 years who are at increased risk of osteoporosis, as determined by a formal clinical risk assessment tool, the USPSTF recommends screening for osteoporosis with bone measurement testing to prevent osteoporotic fractures.   Hepatitis C Screening - Recommend screening for hepatitis C virus (HCV) infection in all adults aged 24 to 43 years.  Lung cancer Screening - Recommend annual screening for lung cancer with low-dose computed tomography (LDCT) in adults ages 24 to 73 years who have a 20 pack-year smoking history and currently smoke or have quit within the past 15 years.  Recommended Vaccinations from the CDC (U.S.  Centers for Disease Control and Prevention)   Influenza one dose annually   COVID vaccine - stay current with the most up-to-date COVID update/booster  Tetanus/diphtheria (Tdap) one booster every 10 years   Zoster/Shingles - (Shingrix) two doses after age 54 (second dose given 2-6 months after first dose)  Pneumococcal 20-valent or 21-valent conjugate vaccine - one dose for adults aged >=65 years with no history of prior pneumococcal vaccination.  Pneumococcal 20-valent or 21-valent conjugate vaccine -  one dose for adults aged >=65 years with PPSV23 only or PCV13 only given more than 1 yr ago.  Pneumococcal 20-valent or 21-valent conjugate vaccine - one dose for adults aged >=65 years who have completed PCV-13 and PPSV23 after 88yrs old and it has been greater than 53yrs (shared clinical decision-making is recommended regarding administration of this vaccine).   RSV (Respiratory  Syncytial Virus) vaccine for everyone ages 7 and older  RSV (Respiratory Syncytial Virus)  vaccine ages 64-74 who are at increased risk of severe RSV disease (for example, chronic heart/lung/liver/kidney disease, immunocompromised)    PERSONAL PREVENTION PLAN   Your Personal Prevention Plan is based on your overall health and your responses to the health questionnaire you completed. The following information is for you to review in addition to the recommendations, referrals, and tests we have discussed at your visit.     Physical Activity:   Physical activity can help you maintain a healthy weight, prevent or control illness, reduce stress, and sleep better. It can also help you improve your balance to avoid falls. Try to build up to and maintain a total of 30 minutes of activity each day. If you are able, try walking, doing yard or housework, and taking the stairs more often. You can also strengthen your muscles with exercises done while sitting or lying down. Web resource - https://www.carpenter-henry.info/  Emotional Health:   Feeling "down in the dumps" or anxious every now and then is a natural part of life. If this feeling lasts for a few weeks or more, talk with me as soon as possible. It could be a sign of a problem  that needs treatment. There are many types of treatment available. Web resource -  RXPreview.de  Falls:   You can reduce your risk of falling by making changes in your home. Remove items that may cause tripping, improve lighting, and consider installing grab bars.   Talk with me if you have problems with balance and walking. To prevent falls, you may need your vision, hearing, or blood pressure checked. Exercises to improve your strength and balance, or using a cane or walker, may help. Review your medicines with me at every visit, because some can affect balance. Please be sure to let me know if you fall or are fearful you may fall. Web resource -  BounceThru.fi  Urinary Leakage:   Urine leakage is common, but it is not a normal part of aging. Talk with me about any urine leakage so that the cause can be found and treated. Treatment can include bladder training, exercises, medicine or surgery.   Pain:   We all have aches and pains at times, but chronic pain can change how you feel and live every day. Please talk with me about any symptoms of chronic pain so that we can determine how best to treat.   Sleep:   Getting a good night's sleep is vital to your health and well-being and can help prevent or manage health problems. Often, sleep can be improved by changing behaviors, including when you go to bed and what you do before bed. Sleep apnea can cause problems such as struggling to stay awake during the day. Please let me know if you would like to learn more about improving your sleep and/or think you may have sleep apnea. Web resource -  ThousandQuestions.com.cy  Seat Belt:   Please remember to wear a seat belt when driving or riding in a vehicle. It is one of the most important things you can do to stay safe in a car.   Nutrition:   Remember to eat plenty of fruits, vegetables, whole grains, and dairy. Drink at least 64 ounces (8 full glasses) of water  a day, unless you have been advised to limit fluids.  Web resource- PickSeat.dk  Alcohol:   Alcohol can have a greater effect on older people, who may feel its effects at a lower amount. Older people should limit alcoholic drinks (no more than one a day for women and no more than two a day for men). Please let me know if alcohol use becomes a problem.  Web resource - AgingMortgage.ca  Tobacco:   Not smoking or using other forms of tobacco is one of the most important things you can do for your health. Here is some more information about the importance of quitting smoking and how to quit smoking - Librarian, academic - BroadJournal.com.pt  Advance Directives:   There may come a time when medical decisions need to be made on your behalf. Please talk with your family, and with me, about your wishes. It is important to provide information about your decisions, and any formal advance directives, for your medical record. Here is additional information on advanced directives - Web resource - MediaExhibitions.no  Additional Support:   Sometimes it can be challenging to manage all aspects of daily life. Finding the right support can help you maintain or improve your health and independence. Please let me know if you would like to talk further about finding resources to assist you.

## 2024-08-22 ENCOUNTER — Telehealth (INDEPENDENT_AMBULATORY_CARE_PROVIDER_SITE_OTHER): Payer: Self-pay | Admitting: Family Medicine

## 2024-08-22 NOTE — Telephone Encounter (Addendum)
 Pt's husband came in office asking for clarification regarding pt's referral for MRI. At the bottom page of the referral order there is a question asking   Does the patient have a pacemaker or defibrillator? No   Husband stated that pt has pacemaker & he would like this changed because he does not want to have any issues once they come in for the MRI appt. Pls advise. Husband requested a call back [phone]

## 2024-08-22 NOTE — Addendum Note (Signed)
 Addended by: JANNETTA BARLOW on: 08/22/2024 07:45 PM     Modules accepted: Orders

## 2024-08-26 ENCOUNTER — Encounter (INDEPENDENT_AMBULATORY_CARE_PROVIDER_SITE_OTHER): Admitting: Physician Assistant

## 2024-08-26 NOTE — Telephone Encounter (Signed)
 Patient is returning call from office. Please return call at noted phone number below.      Patient Preferred Callback Number:        [phone]

## 2024-08-26 NOTE — Telephone Encounter (Signed)
 Called and left message to call back.

## 2024-08-26 NOTE — Telephone Encounter (Signed)
 Called patient, she is informed she needs to get a CT Scan done. She also states she has pain in your right thigh, she will be going to urgent care to be evaluated and treated.

## 2024-08-29 ENCOUNTER — Other Ambulatory Visit (INDEPENDENT_AMBULATORY_CARE_PROVIDER_SITE_OTHER): Payer: Self-pay | Admitting: Family Medicine

## 2024-08-29 ENCOUNTER — Ambulatory Visit (INDEPENDENT_AMBULATORY_CARE_PROVIDER_SITE_OTHER): Payer: Self-pay | Admitting: Family Medicine

## 2024-08-29 ENCOUNTER — Other Ambulatory Visit (INDEPENDENT_AMBULATORY_CARE_PROVIDER_SITE_OTHER): Payer: Self-pay | Admitting: Family

## 2024-09-09 ENCOUNTER — Other Ambulatory Visit (INDEPENDENT_AMBULATORY_CARE_PROVIDER_SITE_OTHER): Payer: Self-pay | Admitting: Family Medicine

## 2024-09-09 NOTE — Telephone Encounter (Signed)
 Last refill 06/2024  Last office Visit 08/2024 annual no lipid since 12/2022  Upcoming appointment none   Medication pending

## 2024-09-25 ENCOUNTER — Other Ambulatory Visit (INDEPENDENT_AMBULATORY_CARE_PROVIDER_SITE_OTHER): Payer: Self-pay | Admitting: Clinical Cardiac Electrophysiology

## 2024-09-25 ENCOUNTER — Telehealth (INDEPENDENT_AMBULATORY_CARE_PROVIDER_SITE_OTHER): Payer: Self-pay

## 2024-09-25 MED ORDER — METOPROLOL SUCCINATE ER 25 MG PO TB24
25.0000 mg | ORAL_TABLET | Freq: Every day | ORAL | Status: AC
Start: 2024-09-25 — End: ?

## 2024-09-25 NOTE — Telephone Encounter (Signed)
 Scheduled pacemaker remote 09/24/24 showing multiple runs of NSVT. Have not called patient.     Multiple VT events dating back to June showing NSVT. Not reviewed at either the June or Sept office visits. Low normal EF 51% Biplane and 55% 3D on Echo 05/2024. Not on BB. 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 AP 1.4%, VP 99.1%. Will review.

## 2024-09-25 NOTE — Telephone Encounter (Signed)
LVMTCB to discuss.

## 2024-09-27 ENCOUNTER — Other Ambulatory Visit (INDEPENDENT_AMBULATORY_CARE_PROVIDER_SITE_OTHER): Payer: Self-pay

## 2024-09-27 MED ORDER — METOPROLOL SUCCINATE ER 25 MG PO TB24
25.0000 mg | ORAL_TABLET | Freq: Every day | ORAL | 3 refills | Status: AC
Start: 2024-09-27 — End: ?

## 2024-09-27 NOTE — Telephone Encounter (Signed)
 MW sent Rx 11/12 but doesn't show it was sent anywhere- orders only? Please see tele encounter from 11/12

## 2024-09-27 NOTE — Telephone Encounter (Signed)
 Spoke with pt relayed MW message. Resent Rx- last one not sent to specific pharmacy?

## 2024-10-09 DIAGNOSIS — Z950 Presence of cardiac pacemaker: Secondary | ICD-10-CM

## 2024-10-09 LAB — REMOTE CARDIAC DEVICE MONITORING
AF Burden Percentage: 0
RV Pacing Percentage: 99.05

## 2024-10-21 ENCOUNTER — Encounter (INDEPENDENT_AMBULATORY_CARE_PROVIDER_SITE_OTHER): Payer: Self-pay

## 2024-10-23 NOTE — Progress Notes (Unsigned)
 [facility]  [phone]  [email or web address]       I had the pleasure of seeing Maureen Vasquez today for arrhythmia and device management.    Maureen Vasquez is a 89 y.o. female with a past medical history significant for arthritis,  non-Hodgkin's lymphoma, HLD, and syncope who presents with CHB. She is s/p dual chamber PPM (LBBaP lead) 6/10/205.    She was seen by Dr.Yang in September, reporting worsening SOB. Device was found to be functioning appropriately, AV delays were extended to allow for intermittent intrinsic conduction. CXR was unremarkable. Remote check showed new NSVT, she was started on toprol .    She continues to have SOB with exertion. States that she maybe felt better for a few weeks after seeing Dr.Yang but has noted increased SOB in the last week or two. She reports that she is not sleeping well at night but no PND or orthopnea. No weight gain, no pedal edema.      She does participate in remote monitoring of implanted cardiac device.      Cardiographics:  TTE 05/2024:    * The left ventricle is small.    * Left ventricular systolic function is low normal with an ejection fraction  by Biplane Method of Discs of  51% (and 55% by 3D echo).    * There is Grade I diastolic dysfunction (impaired relaxation pattern).    * The right ventricular cavity size is normal in size.    * Normal right ventricular systolic function.    * TAPSE = 1.6 cm, S' = 9 cm/s, fractional area change = 35 %.    * There is a pacemaker wire in the right ventricle.    * There is mild to moderate mitral regurgitation.    * Insufficient tricuspid regurgitation jet to accurately estimate pulmonary  artery systolic pressure.    * The IVC is normal in size with > 50% respiratory variance consistent with  normal RA pressure of 3 mmHg.    * No pericardial effusion visualized.    * Compared to the prior study dated 04/26/21, there has been no significant  change (mild to moderate MR).    ECG: AsVp, small RsR', LVAT     DEVICE INTERROGATION:  PPM Interrogation    Device type Dual Chamber PPM   Manufacturer [employer]onic   Model Azure   Implant date 05/23/2024   Indication  CHB     Presenting rhythm:  AsVp  Underlying rhythm:  SR with CHB    Leads  RA 0.5 V @ 0.4 ms 3.8 mV 570 ohms   RV 1 V @ 0.4 ms 6.4 mV 513 ohms     Diagnostic Data  A-paced 1.8 %   V-paced 99 %     High rate episodes  A-episodes 2 brief, p waves falling in refractory with Ap   V-episodes 1 NSVT since remote     Battery status:  satisfactory     Final Parameters:  Mode  DDD    Lower-Upper Rate 60 - 120 bpm      Notes / Changes made:      Outputs remain in postoperative settings.            PMH:   Patient Active Problem List    Diagnosis Date Noted    Cardiac pacemaker in situ 10/24/2024    Complete heart block (CMS/HCC) 04/23/2024    ILD (interstitial lung disease) (CMS/HCC) 12/28/2022    Cataract, nuclear sclerotic  senile, left 04/07/2016    Nuclear sclerotic cataract of left eye 03/28/2016    Neutropenia 02/27/2014    NHL (non-Hodgkin's lymphoma) (CMS/HCC) 02/27/2014    Generalized weakness 02/27/2014    Low back ache 11/16/2011        MEDICATIONS:     Current Medications[1]     SH: Social History[2]    REVIEW OF SYSTEMS: All other systems reviewed and negative except as above.    PHYSICAL EXAMINATION  General Appearance: A well-appearing female in no acute distress.   Vital Signs: BP 130/68 (BP Site: Left arm, Patient Position: Sitting, Cuff Size: Small)   Pulse 73   Ht 1.626 m (5' 4)   Wt 44.5 kg (98 lb)   BMI 16.82 kg/m    Chest: Clear to auscultation bilaterally with good air movement and respiratory effort and no wheezes, rales, or rhonchi  L sided PM incision: Erythema and mild irritation which appears related to Dermabond.  Removed most of the Dermabond and the erythema improved within minutes.  Incision closed.  Drainage does not appear to be coming from the incision.  Cardiovascular: Normal S1 and physiologically split S2 without murmurs,  gallops or rub. PMI of normal size and nondisplaced.   Extremities: Warm without edema. All peripheral pulses are full and equal.      Assessment/Plan:   1. Complete heart block (CMS/HCC)  Referral to Cardiology ([facility])    Transthoracic Echocardiogram (TTE)      2. Cardiac pacemaker in situ  Transthoracic Echocardiogram (TTE)      3. SOB (shortness of breath)  CBC with Differential (Order)    Basic Metabolic Panel    NT-ProBNP    NT-ProBNP    Referral to Cardiology ([facility])    Transthoracic Echocardiogram (TTE)          Normal device function, reviewed with patient  Repeat pro-BNP (downtrending at last visit), CBC, BMP  Recheck echo  Suspect symptoms are multifactorial (ILD likely a factor). Close f/u with PCP. She will re-establish care with Dr.Ardestani.  6 mo f/u or sooner prn      __________________________________________________  Peyton Traylon Schimming PA-C  [facility]        Incident to service performed with physician present in the office in accordance with this patient's established plan of care.         [1]   Current Outpatient Medications   Medication Sig Dispense Refill    acetaminophen  (TYLENOL ) 500 MG tablet Take 2 tablets (1,000 mg) by mouth every 6 (six) hours as needed for Pain      atorvastatin  (LIPITOR) 10 MG tablet TAKE ONE TABLET BY MOUTH once EVERY NIGHT 90 tablet 0    metoprolol  succinate XL (TOPROL -XL) 25 MG 24 hr tablet Take 1 tablet (25 mg) by mouth once daily 90 tablet 3    RABEprazole (ACIPHEX) 20 MG tablet Take 1 tablet (20 mg) by mouth once daily      vitamin B-12 (CYANOCOBALAMIN) 1000 MCG tablet Take 1 tablet (1,000 mcg) by mouth once daily       Current Facility-Administered Medications   Medication Dose Route Frequency Provider Last Rate Last Admin    metoprolol  succinate XL (TOPROL -XL) 24 hr tablet 25 mg  25 mg Oral Daily Wish, Oliva DEL, MD       [2]   Social History  Tobacco Use    Smoking status: Never    Smokeless tobacco: Never   Vaping Use  Vaping  status: Never Used   Substance Use Topics    Alcohol use: Yes     Alcohol/week: 2.0 standard drinks of alcohol     Types: 2 Glasses of wine per week    Drug use: No

## 2024-10-24 ENCOUNTER — Ambulatory Visit (INDEPENDENT_AMBULATORY_CARE_PROVIDER_SITE_OTHER): Payer: Self-pay | Admitting: Physician Assistant

## 2024-10-24 ENCOUNTER — Ambulatory Visit (INDEPENDENT_AMBULATORY_CARE_PROVIDER_SITE_OTHER): Admitting: Physician Assistant

## 2024-10-24 ENCOUNTER — Other Ambulatory Visit (FREE_STANDING_LABORATORY_FACILITY)

## 2024-10-24 ENCOUNTER — Encounter (INDEPENDENT_AMBULATORY_CARE_PROVIDER_SITE_OTHER): Payer: Self-pay

## 2024-10-24 VITALS — BP 130/68 | HR 73 | Ht 64.0 in | Wt 98.0 lb

## 2024-10-24 DIAGNOSIS — R0602 Shortness of breath: Secondary | ICD-10-CM

## 2024-10-24 DIAGNOSIS — I442 Atrioventricular block, complete: Secondary | ICD-10-CM

## 2024-10-24 DIAGNOSIS — Z950 Presence of cardiac pacemaker: Secondary | ICD-10-CM | POA: Insufficient documentation

## 2024-10-24 LAB — LAB USE ONLY - CBC WITH DIFFERENTIAL
Absolute Basophils: 0.04 x10 3/uL (ref 0.00–0.08)
Absolute Eosinophils: 0.25 x10 3/uL (ref 0.00–0.44)
Absolute Immature Granulocytes: 0.03 x10 3/uL (ref 0.00–0.07)
Absolute Lymphocytes: 1.52 x10 3/uL (ref 0.42–3.22)
Absolute Monocytes: 0.93 x10 3/uL — ABNORMAL HIGH (ref 0.21–0.85)
Absolute Neutrophils: 6.45 x10 3/uL — ABNORMAL HIGH (ref 1.10–6.33)
Absolute nRBC: 0 x10 3/uL (ref <=0.00)
Basophils %: 0.4 %
Eosinophils %: 2.7 %
Hematocrit: 40.8 % (ref 34.7–43.7)
Hemoglobin: 13.7 g/dL (ref 11.4–14.8)
Immature Granulocytes %: 0.3 %
Lymphocytes %: 16.5 %
MCH: 29.7 pg (ref 25.1–33.5)
MCHC: 33.6 g/dL (ref 31.5–35.8)
MCV: 88.5 fL (ref 78.0–96.0)
MPV: 10.7 fL (ref 8.9–12.5)
Monocytes %: 10.1 %
Neutrophils %: 70 %
Platelet Count: 249 x10 3/uL (ref 142–346)
Preliminary Absolute Neutrophil Count: 6.45 x10 3/uL — ABNORMAL HIGH (ref 1.10–6.33)
RBC: 4.61 x10 6/uL (ref 3.90–5.10)
RDW: 13 % (ref 11–15)
WBC: 9.22 x10 3/uL (ref 3.10–9.50)
nRBC %: 0 /100{WBCs} (ref <=0.0)

## 2024-10-24 LAB — BASIC METABOLIC PANEL
Anion Gap: 8 (ref 5.0–15.0)
BUN: 17 mg/dL (ref 7–21)
CO2: 24 meq/L (ref 17–29)
Calcium: 9.3 mg/dL (ref 7.9–10.2)
Chloride: 103 meq/L (ref 99–111)
Creatinine: 0.7 mg/dL (ref 0.4–1.0)
GFR: >60 mL/min/1.73 m2 (ref >=60.0)
Glucose: 87 mg/dL (ref 70–100)
Hemolysis Index: 9 {index}
Potassium: 4.3 meq/L (ref 3.5–5.3)
Sodium: 135 meq/L (ref 135–145)

## 2024-10-24 LAB — NT-PROBNP: NT-ProBNP: 1105.1 pg/mL — ABNORMAL HIGH (ref <=956.1)

## 2024-10-24 NOTE — Patient Instructions (Addendum)
 Schedule an appointment with Dr.Ardestani (referral placed). Can see her PA or Nurse Practitioner.    2. Continue to follow up with Dr.Mahmud    3. Continue metoprolol .    4. Schedule echocardiogram

## 2024-11-20 ENCOUNTER — Ambulatory Visit
Admission: RE | Admit: 2024-11-20 | Discharge: 2024-11-20 | Disposition: A | Discharge status: Home / Self Care | Source: Ambulatory Visit | Attending: Physician Assistant | Admitting: Physician Assistant

## 2024-11-20 DIAGNOSIS — Z950 Presence of cardiac pacemaker: Secondary | ICD-10-CM | POA: Insufficient documentation

## 2024-11-20 DIAGNOSIS — R0602 Shortness of breath: Secondary | ICD-10-CM | POA: Insufficient documentation

## 2024-11-20 DIAGNOSIS — I442 Atrioventricular block, complete: Secondary | ICD-10-CM | POA: Insufficient documentation

## 2024-11-21 ENCOUNTER — Encounter (INDEPENDENT_AMBULATORY_CARE_PROVIDER_SITE_OTHER): Payer: Self-pay

## 2024-11-21 LAB — ECHO ADULT TTE COMPLETE
AV Area (Cont Eq VTI): 2.0426
AV Mean Gradient: 3
AV Peak Velocity: 1.13
Ao Root Diameter (2D): 2.7
BP Mod LV Ejection Fraction: 50
IVS Diastolic Thickness (2D): 1.14
LA Dimension (2D): 4.7
LA Volume Index (BP A-L): 36.4157
LVID diastole (2D): 3.75
LVID systole (2D): 2.84
MV E/A: 0.7664
MV E/e' (Average): 10.8681
Mitral Valve Findings: NORMAL
Prox Ascending Aorta Diameter: 3.3
Pulmonary Valve Findings: NORMAL
RV Basal Diastolic Dimension: 2.78
RV Systolic Pressure: 24.5296
TAPSE: 2
Tricuspid Valve Findings: NORMAL

## 2024-11-25 ENCOUNTER — Other Ambulatory Visit (INDEPENDENT_AMBULATORY_CARE_PROVIDER_SITE_OTHER): Payer: Self-pay | Admitting: Family Medicine

## 2024-11-25 NOTE — Telephone Encounter (Signed)
 Last filled October 2025.   Last o/v October 2025.  Patient has an upcoming appointment on November 29 2024.  Queued up 90 with 0 refills.

## 2024-11-29 ENCOUNTER — Ambulatory Visit (INDEPENDENT_AMBULATORY_CARE_PROVIDER_SITE_OTHER): Admitting: Family Medicine

## 2024-11-29 ENCOUNTER — Encounter (INDEPENDENT_AMBULATORY_CARE_PROVIDER_SITE_OTHER): Payer: Self-pay | Admitting: Family Medicine

## 2024-11-29 VITALS — BP 136/71 | HR 70

## 2024-11-29 DIAGNOSIS — J849 Interstitial pulmonary disease, unspecified: Secondary | ICD-10-CM

## 2024-11-29 DIAGNOSIS — R413 Other amnesia: Secondary | ICD-10-CM

## 2024-11-29 DIAGNOSIS — R0602 Shortness of breath: Secondary | ICD-10-CM

## 2024-11-29 DIAGNOSIS — I5032 Chronic diastolic (congestive) heart failure: Secondary | ICD-10-CM

## 2024-11-29 NOTE — Progress Notes (Unsigned)
 [facility]                       Date of Exam: 11/29/2024 1:45 PM        Patient ID: Maureen Vasquez is a 89 y.o. female.  Attending Physician: Wilburn Hahn, MD        Chief Complaint:    Chief Complaint   Patient presents with    Memory Loss     Follow up              HPI:    HPI:  Maureen Vasquez with h/o HL, ILD (CT chest 12/2022), NHL s/p chemo (2014), CHB s/p PPM (04/2024), shingles (11/2020), acid reflux, osteoporosis, lumbar stenosis is here to follow up from last office visit (08/2024).  C/o worsening short term memory loss (08/2024). She is requesting to see neurology and discuss medications for slowing down the progression.  CT head (08/29/24) showed progression in age related circulation changes. No stroke or hemorrhage noted. MRI and MRA/MRV brain in 2022 which showed leukomalacia reflective of chronic small vessel ischemic change and atherosclerosis.   C/o progression in shortness of breath on exertion x few months. S/p PPM placement for CHB (04/2024). Pro BNP (10/24/24) is down trending but elevated. Echo 11/20/24 with arrhythmia clinic showed EF 50%, G1DD, moderate mitral regurgitation (Compared to the prior study dated 05/31/2024, there has been no significant change). Pt is scheduled to follow-up with cardiology on 12/19/24.  She is not on a diuretic.  She has not followed up with pulmonologist since 12/2022.          Problem List:    Problem List[1]          Current Meds:    Medications Taking[2]       Allergies:    Allergies[3]          Past Surgical History:    Past Surgical History[4]        Family History:    Family History[5]        Social History:    Social History[6]        The following sections were reviewed this encounter by the provider:   Tobacco  Allergies  Meds  Problems  Med Hx  Surg Hx  Fam Hx             Vital Signs:    BP 136/71 (BP Site: Left arm, Patient Position: Sitting, Cuff Size: Medium)   Pulse 70   SpO2 95%          ROS:    Review of Systems    Constitutional:  Negative for fever.   Respiratory:  Positive for shortness of breath. Negative for cough.    Cardiovascular:  Negative for chest pain, palpitations and leg swelling.   Gastrointestinal:  Negative for nausea and vomiting.   Musculoskeletal:         No depression, anxiety per pt.   Neurological:  Negative for dizziness.   Psychiatric/Behavioral:  Negative for behavioral problems.             Physical Exam:    Physical Exam  Vitals reviewed.   Constitutional:       Appearance: Normal appearance.   Cardiovascular:      Rate and Rhythm: Normal rate and regular rhythm.   Pulmonary:      Effort: Pulmonary effort is normal.      Breath sounds: No wheezing.  Comments: B/l basilar crackles at baseline.  Neurological:      Mental Status: She is alert and oriented to person, place, and time.      Comments: Minicog 3/5. CDT normal.   Psychiatric:         Mood and Affect: Mood normal.         Behavior: Behavior normal.             Assessment:    1. Short-term memory loss  - Referral to Neurology; Future    2. Cerebral leukomalacia (CMS/HCC)  - Referral to Neurology; Future    3. SOB (shortness of breath) on exertion    4. Chronic heart failure with preserved ejection fraction (HFpEF) (CMS/HCC)    5. ILD (interstitial lung disease) (CMS/HCC)          Plan:    Short term memory loss is 2/2 vascular etiology. Referred to neurology for evaluation and management.  Dyspnea is 2/2 HFpEF and ILD.  Pt will benefit from addition of a low-dose diuretic. She will see cardiology on 12/19/24. Discussed with pt and her husband to follow-up with pulmonology for updated PFT and imaging.        Follow-up:    As needed        Wilburn Hahn, MD                     [1]   Patient Active Problem List  Diagnosis    Low back ache    Neutropenia    NHL (non-Hodgkin's lymphoma) (CMS/HCC)    Generalized weakness    Nuclear sclerotic cataract of left eye    Cataract, nuclear sclerotic senile, left    ILD (interstitial lung disease)  (CMS/HCC)    Complete heart block (CMS/HCC)    Cardiac pacemaker in situ   [2]   Outpatient Medications Marked as Taking for the 11/29/24 encounter (Office Visit) with Hahn Wilburn, MD   Medication Sig Dispense Refill    acetaminophen  (TYLENOL ) 500 MG tablet Take 2 tablets (1,000 mg) by mouth every 6 (six) hours as needed for Pain      atorvastatin  (LIPITOR) 10 MG tablet TAKE ONE TABLET BY MOUTH EVERY NIGHT. 90 tablet 0    metoprolol  succinate XL (TOPROL -XL) 25 MG 24 hr tablet Take 1 tablet (25 mg) by mouth once daily 90 tablet 3    RABEprazole (ACIPHEX) 20 MG tablet Take 1 tablet (20 mg) by mouth once daily      vitamin B-12 (CYANOCOBALAMIN) 1000 MCG tablet Take 1 tablet (1,000 mcg) by mouth once daily       Current Facility-Administered Medications for the 11/29/24 encounter (Office Visit) with Hahn Wilburn, MD   Medication Dose Route Frequency Provider Last Rate Last Admin    metoprolol  succinate XL (TOPROL -XL) 24 hr tablet 25 mg  25 mg Oral Daily Wish, Oliva DEL, MD       [3]   Allergies  Allergen Reactions    Aspirin Other (See Comments)     Oral and stomach irritation if taken daily    Codeine Nausea Only    Salicylates    [4]   Past Surgical History:  Procedure Laterality Date    APPENDECTOMY (OPEN)      Age of 78    EXCISION, SQUAMOUS CELL      Right hand/ 2000    EXTRACTION, CATARACT, PHACO, IOL Left 04/07/2016    Procedure: EXTRACTION, CATARACT, PHACO, IOL;  Surgeon: Viviana Vick GRADE, MD;  Location:  [facility];  Service: Ophthalmology;  Laterality: Left;  LEFT PHACO W/IOL    INSERTION, CATHETER, CENTRAL VENOUS, TUNNELED, WITH SUBCUTANEOUS PORT      INSERTION, PACEMAKER COMPLETE N/A 04/23/2024    PM IMPLANT DUAL N/A 04/23/2024    Procedure: PM Implant Dual;  Surgeon: Tilford Arthea DASEN, MD;  Location: [facility];  Service: Cardiovascular;  Laterality: N/A;   [5]   Family History  Problem Relation Name Age of Onset    Heart attack Mother      Heart disease Mother      Parkinsonism Mother      Hyperlipidemia  Mother      Heart disease Sister      Heart disease Brother     [6]   Social History  Tobacco Use    Smoking status: Never    Smokeless tobacco: Never   Vaping Use    Vaping status: Never Used   Substance Use Topics    Alcohol use: Yes     Alcohol/week: 2.0 standard drinks of alcohol     Types: 2 Glasses of wine per week    Drug use: No

## 2024-11-30 ENCOUNTER — Encounter (INDEPENDENT_AMBULATORY_CARE_PROVIDER_SITE_OTHER): Payer: Self-pay | Admitting: Family Medicine

## 2024-12-13 ENCOUNTER — Telehealth (INDEPENDENT_AMBULATORY_CARE_PROVIDER_SITE_OTHER): Payer: Self-pay

## 2024-12-13 NOTE — Telephone Encounter (Signed)
All records in Epic.

## 2024-12-15 DEATH — deceased

## 2024-12-19 ENCOUNTER — Encounter (HOSPITAL_BASED_OUTPATIENT_CLINIC_OR_DEPARTMENT_OTHER): Payer: Self-pay | Admitting: Internal Medicine

## 2024-12-19 ENCOUNTER — Ambulatory Visit (HOSPITAL_BASED_OUTPATIENT_CLINIC_OR_DEPARTMENT_OTHER): Admitting: Internal Medicine

## 2024-12-19 VITALS — BP 138/74 | HR 89 | Ht 64.0 in | Wt 118.0 lb

## 2024-12-19 DIAGNOSIS — R0609 Other forms of dyspnea: Secondary | ICD-10-CM

## 2024-12-19 DIAGNOSIS — J849 Interstitial pulmonary disease, unspecified: Secondary | ICD-10-CM

## 2024-12-19 DIAGNOSIS — I2510 Atherosclerotic heart disease of native coronary artery without angina pectoris: Secondary | ICD-10-CM

## 2024-12-19 DIAGNOSIS — E785 Hyperlipidemia, unspecified: Secondary | ICD-10-CM

## 2024-12-19 DIAGNOSIS — I10 Essential (primary) hypertension: Secondary | ICD-10-CM

## 2024-12-19 DIAGNOSIS — R0602 Shortness of breath: Secondary | ICD-10-CM

## 2024-12-19 DIAGNOSIS — I442 Atrioventricular block, complete: Secondary | ICD-10-CM

## 2024-12-19 LAB — ECG 12-LEAD
Atrial Rate: 68 {beats}/min
P Axis: 57 degrees
P-R Interval: 218 ms
Q-T Interval: 432 ms
QRS Duration: 116 ms
QTC Calculation (Bezet): 459 ms
R Axis: 55 degrees
T Axis: 41 degrees
Ventricular Rate: 68 {beats}/min

## 2024-12-19 NOTE — Patient Instructions (Signed)
 Fasting blood test  Stress test   See your pulmonologist  We will call you to get your medication list  Please monitor your blood pressure and heart rate,Resting BP is around 130/80 most of the time   Low-salt diet

## 2025-05-09 ENCOUNTER — Encounter (INDEPENDENT_AMBULATORY_CARE_PROVIDER_SITE_OTHER): Admitting: Physician Assistant

## 2025-06-16 ENCOUNTER — Ambulatory Visit (HOSPITAL_BASED_OUTPATIENT_CLINIC_OR_DEPARTMENT_OTHER): Admitting: Internal Medicine

## 2025-06-17 ENCOUNTER — Ambulatory Visit: Admitting: Neurology
